# Patient Record
Sex: Male | Born: 1935
Health system: Southern US, Community
[De-identification: ages and names within clinical notes are randomized; demographics above are authoritative.]

## PROBLEM LIST (undated history)

## (undated) DIAGNOSIS — I10 Essential (primary) hypertension: Secondary | ICD-10-CM

## (undated) DIAGNOSIS — C61 Malignant neoplasm of prostate: Secondary | ICD-10-CM

## (undated) DIAGNOSIS — E7800 Pure hypercholesterolemia, unspecified: Secondary | ICD-10-CM

## (undated) DIAGNOSIS — I779 Disorder of arteries and arterioles, unspecified: Secondary | ICD-10-CM

## (undated) DIAGNOSIS — I2510 Atherosclerotic heart disease of native coronary artery without angina pectoris: Secondary | ICD-10-CM

## (undated) HISTORY — DX: Malignant neoplasm of prostate: C61

## (undated) HISTORY — DX: Atherosclerotic heart disease of native coronary artery without angina pectoris: I25.10

## (undated) HISTORY — DX: Pure hypercholesterolemia, unspecified: E78.00

## (undated) HISTORY — DX: Disorder of arteries and arterioles, unspecified: I77.9

## (undated) HISTORY — PX: CORNEAL TRANSPLANT: SHX108

## (undated) HISTORY — PX: OTHER SURGICAL HISTORY: SHX169

## (undated) HISTORY — DX: Essential (primary) hypertension: I10

---

## 2001-09-19 ENCOUNTER — Ambulatory Visit (HOSPITAL_COMMUNITY): Admission: RE | Admit: 2001-09-19 | Discharge: 2001-09-19 | Payer: Self-pay | Admitting: Interventional Cardiology

## 2001-09-26 ENCOUNTER — Encounter (HOSPITAL_COMMUNITY): Admission: RE | Admit: 2001-09-26 | Discharge: 2001-12-04 | Payer: Self-pay | Admitting: Interventional Cardiology

## 2002-04-10 ENCOUNTER — Encounter: Payer: Self-pay | Admitting: Surgery

## 2002-04-12 ENCOUNTER — Ambulatory Visit (HOSPITAL_COMMUNITY): Admission: RE | Admit: 2002-04-12 | Discharge: 2002-04-12 | Payer: Self-pay | Admitting: Surgery

## 2002-04-18 ENCOUNTER — Encounter: Admission: RE | Admit: 2002-04-18 | Discharge: 2002-04-18 | Payer: Self-pay | Admitting: Emergency Medicine

## 2002-04-18 ENCOUNTER — Encounter: Payer: Self-pay | Admitting: Emergency Medicine

## 2004-04-23 ENCOUNTER — Ambulatory Visit (HOSPITAL_COMMUNITY): Admission: RE | Admit: 2004-04-23 | Discharge: 2004-04-23 | Payer: Self-pay | Admitting: Urology

## 2004-04-23 ENCOUNTER — Ambulatory Visit (HOSPITAL_BASED_OUTPATIENT_CLINIC_OR_DEPARTMENT_OTHER): Admission: RE | Admit: 2004-04-23 | Discharge: 2004-04-23 | Payer: Self-pay | Admitting: Urology

## 2008-07-23 ENCOUNTER — Encounter: Admission: RE | Admit: 2008-07-23 | Discharge: 2008-07-23 | Payer: Self-pay | Admitting: Internal Medicine

## 2008-07-29 ENCOUNTER — Ambulatory Visit (HOSPITAL_COMMUNITY): Admission: RE | Admit: 2008-07-29 | Discharge: 2008-07-29 | Payer: Self-pay | Admitting: Thoracic Surgery

## 2008-07-31 ENCOUNTER — Ambulatory Visit: Payer: Self-pay | Admitting: Thoracic Surgery

## 2008-08-02 ENCOUNTER — Ambulatory Visit: Admission: RE | Admit: 2008-08-02 | Discharge: 2008-08-02 | Payer: Self-pay | Admitting: Thoracic Surgery

## 2008-08-02 ENCOUNTER — Encounter: Payer: Self-pay | Admitting: Emergency Medicine

## 2008-08-21 ENCOUNTER — Ambulatory Visit: Payer: Self-pay | Admitting: Thoracic Surgery

## 2008-10-22 ENCOUNTER — Ambulatory Visit: Payer: Self-pay | Admitting: Thoracic Surgery

## 2008-10-24 ENCOUNTER — Encounter: Payer: Self-pay | Admitting: Thoracic Surgery

## 2008-10-24 ENCOUNTER — Inpatient Hospital Stay (HOSPITAL_COMMUNITY): Admission: RE | Admit: 2008-10-24 | Discharge: 2008-10-29 | Payer: Self-pay | Admitting: Thoracic Surgery

## 2008-10-24 ENCOUNTER — Ambulatory Visit: Payer: Self-pay | Admitting: Thoracic Surgery

## 2008-10-24 HISTORY — PX: OTHER SURGICAL HISTORY: SHX169

## 2008-11-06 ENCOUNTER — Ambulatory Visit: Payer: Self-pay | Admitting: Internal Medicine

## 2008-11-06 ENCOUNTER — Encounter: Admission: RE | Admit: 2008-11-06 | Discharge: 2008-11-06 | Payer: Self-pay | Admitting: Thoracic Surgery

## 2008-11-06 ENCOUNTER — Encounter: Payer: Self-pay | Admitting: Emergency Medicine

## 2008-11-06 ENCOUNTER — Ambulatory Visit: Payer: Self-pay | Admitting: Thoracic Surgery

## 2008-11-08 DIAGNOSIS — E785 Hyperlipidemia, unspecified: Secondary | ICD-10-CM | POA: Insufficient documentation

## 2008-11-08 DIAGNOSIS — C3490 Malignant neoplasm of unspecified part of unspecified bronchus or lung: Secondary | ICD-10-CM | POA: Insufficient documentation

## 2008-11-08 DIAGNOSIS — I2510 Atherosclerotic heart disease of native coronary artery without angina pectoris: Secondary | ICD-10-CM | POA: Insufficient documentation

## 2008-11-08 DIAGNOSIS — I10 Essential (primary) hypertension: Secondary | ICD-10-CM | POA: Insufficient documentation

## 2008-11-12 ENCOUNTER — Ambulatory Visit: Payer: Self-pay | Admitting: Emergency Medicine

## 2008-11-12 DIAGNOSIS — R0902 Hypoxemia: Secondary | ICD-10-CM

## 2008-11-12 DIAGNOSIS — R05 Cough: Secondary | ICD-10-CM

## 2008-11-13 LAB — COMPREHENSIVE METABOLIC PANEL
BUN: 12 mg/dL (ref 6–23)
CO2: 26 mEq/L (ref 19–32)
Creatinine, Ser: 0.9 mg/dL (ref 0.40–1.50)
Glucose, Bld: 114 mg/dL — ABNORMAL HIGH (ref 70–99)
Total Bilirubin: 0.4 mg/dL (ref 0.3–1.2)

## 2008-11-13 LAB — CBC WITH DIFFERENTIAL/PLATELET
BASO%: 0.8 % (ref 0.0–2.0)
Basophils Absolute: 0.1 10*3/uL (ref 0.0–0.1)
Eosinophils Absolute: 0.5 10*3/uL (ref 0.0–0.5)
HCT: 36.6 % — ABNORMAL LOW (ref 38.4–49.9)
LYMPH%: 10.4 % — ABNORMAL LOW (ref 14.0–49.0)
MCHC: 32.8 g/dL (ref 32.0–36.0)
MONO#: 1.1 10*3/uL — ABNORMAL HIGH (ref 0.1–0.9)
NEUT%: 71 % (ref 39.0–75.0)
Platelets: 339 10*3/uL (ref 140–400)
WBC: 9 10*3/uL (ref 4.0–10.3)

## 2008-11-14 ENCOUNTER — Telehealth: Payer: Self-pay | Admitting: Emergency Medicine

## 2008-11-15 ENCOUNTER — Ambulatory Visit (HOSPITAL_COMMUNITY): Admission: RE | Admit: 2008-11-15 | Discharge: 2008-11-15 | Payer: Self-pay | Admitting: Emergency Medicine

## 2008-11-20 ENCOUNTER — Encounter: Admission: RE | Admit: 2008-11-20 | Discharge: 2008-11-20 | Payer: Self-pay | Admitting: Thoracic Surgery

## 2008-11-20 ENCOUNTER — Encounter: Payer: Self-pay | Admitting: Emergency Medicine

## 2008-11-20 ENCOUNTER — Ambulatory Visit: Payer: Self-pay | Admitting: Thoracic Surgery

## 2008-11-22 ENCOUNTER — Ambulatory Visit: Payer: Self-pay | Admitting: Emergency Medicine

## 2009-02-19 ENCOUNTER — Encounter: Admission: RE | Admit: 2009-02-19 | Discharge: 2009-02-19 | Payer: Self-pay | Admitting: Thoracic Surgery

## 2009-02-19 ENCOUNTER — Ambulatory Visit: Payer: Self-pay | Admitting: Thoracic Surgery

## 2009-04-22 ENCOUNTER — Ambulatory Visit: Payer: Self-pay | Admitting: Internal Medicine

## 2009-04-24 ENCOUNTER — Ambulatory Visit (HOSPITAL_COMMUNITY): Admission: RE | Admit: 2009-04-24 | Discharge: 2009-04-24 | Payer: Self-pay | Admitting: Internal Medicine

## 2009-04-24 LAB — COMPREHENSIVE METABOLIC PANEL
ALT: 12 U/L (ref 0–53)
CO2: 29 mEq/L (ref 19–32)
Creatinine, Ser: 0.98 mg/dL (ref 0.40–1.50)
Total Bilirubin: 0.4 mg/dL (ref 0.3–1.2)

## 2009-04-24 LAB — CBC WITH DIFFERENTIAL/PLATELET
BASO%: 0.6 % (ref 0.0–2.0)
EOS%: 2.2 % (ref 0.0–7.0)
HCT: 38.1 % — ABNORMAL LOW (ref 38.4–49.9)
LYMPH%: 10.7 % — ABNORMAL LOW (ref 14.0–49.0)
MCH: 29.4 pg (ref 27.2–33.4)
MCHC: 33.2 g/dL (ref 32.0–36.0)
MONO#: 1.1 10*3/uL — ABNORMAL HIGH (ref 0.1–0.9)
NEUT%: 72.6 % (ref 39.0–75.0)
Platelets: 292 10*3/uL (ref 140–400)

## 2009-05-07 ENCOUNTER — Ambulatory Visit: Payer: Self-pay | Admitting: Thoracic Surgery

## 2009-12-23 ENCOUNTER — Ambulatory Visit: Payer: Self-pay | Admitting: Internal Medicine

## 2009-12-25 LAB — COMPREHENSIVE METABOLIC PANEL
ALT: 12 U/L (ref 0–53)
AST: 19 U/L (ref 0–37)
Alkaline Phosphatase: 78 U/L (ref 39–117)
BUN: 12 mg/dL (ref 6–23)
Chloride: 105 mEq/L (ref 96–112)
Creatinine, Ser: 0.96 mg/dL (ref 0.40–1.50)
Potassium: 4.1 mEq/L (ref 3.5–5.3)

## 2009-12-25 LAB — CBC WITH DIFFERENTIAL/PLATELET
BASO%: 0.2 % (ref 0.0–2.0)
Basophils Absolute: 0 10*3/uL (ref 0.0–0.1)
EOS%: 2.2 % (ref 0.0–7.0)
HGB: 13.5 g/dL (ref 13.0–17.1)
MCH: 31.1 pg (ref 27.2–33.4)
MCHC: 34.2 g/dL (ref 32.0–36.0)
MCV: 90.9 fL (ref 79.3–98.0)
MONO%: 12 % (ref 0.0–14.0)
NEUT%: 77 % — ABNORMAL HIGH (ref 39.0–75.0)
RDW: 14.9 % — ABNORMAL HIGH (ref 11.0–14.6)
lymph#: 0.7 10*3/uL — ABNORMAL LOW (ref 0.9–3.3)

## 2009-12-26 ENCOUNTER — Ambulatory Visit (HOSPITAL_COMMUNITY): Admission: RE | Admit: 2009-12-26 | Discharge: 2009-12-26 | Payer: Self-pay | Admitting: Internal Medicine

## 2009-12-31 ENCOUNTER — Ambulatory Visit: Payer: Self-pay | Admitting: Thoracic Surgery

## 2010-05-29 ENCOUNTER — Other Ambulatory Visit: Payer: Self-pay | Admitting: Internal Medicine

## 2010-05-29 DIAGNOSIS — C3490 Malignant neoplasm of unspecified part of unspecified bronchus or lung: Secondary | ICD-10-CM

## 2010-07-09 ENCOUNTER — Other Ambulatory Visit: Payer: Self-pay | Admitting: Internal Medicine

## 2010-07-09 ENCOUNTER — Encounter (HOSPITAL_BASED_OUTPATIENT_CLINIC_OR_DEPARTMENT_OTHER): Payer: Medicare Other | Admitting: Internal Medicine

## 2010-07-09 DIAGNOSIS — C3410 Malignant neoplasm of upper lobe, unspecified bronchus or lung: Secondary | ICD-10-CM

## 2010-07-09 DIAGNOSIS — C3490 Malignant neoplasm of unspecified part of unspecified bronchus or lung: Secondary | ICD-10-CM

## 2010-07-09 LAB — CBC WITH DIFFERENTIAL/PLATELET
BASO%: 0.3 % (ref 0.0–2.0)
Basophils Absolute: 0 10*3/uL (ref 0.0–0.1)
EOS%: 1.7 % (ref 0.0–7.0)
HCT: 39.5 % (ref 38.4–49.9)
HGB: 13.1 g/dL (ref 13.0–17.1)
LYMPH%: 9.3 % — ABNORMAL LOW (ref 14.0–49.0)
MCH: 29.1 pg (ref 27.2–33.4)
MCHC: 33 g/dL (ref 32.0–36.0)
MCV: 87.9 fL (ref 79.3–98.0)
MONO%: 11.5 % (ref 0.0–14.0)
NEUT%: 77.2 % — ABNORMAL HIGH (ref 39.0–75.0)

## 2010-07-09 LAB — COMPREHENSIVE METABOLIC PANEL
AST: 14 U/L (ref 0–37)
Alkaline Phosphatase: 85 U/L (ref 39–117)
BUN: 12 mg/dL (ref 6–23)
Calcium: 8.2 mg/dL — ABNORMAL LOW (ref 8.4–10.5)
Chloride: 105 mEq/L (ref 96–112)
Creatinine, Ser: 1.03 mg/dL (ref 0.40–1.50)
Total Bilirubin: 0.5 mg/dL (ref 0.3–1.2)

## 2010-07-10 ENCOUNTER — Ambulatory Visit (HOSPITAL_COMMUNITY)
Admission: RE | Admit: 2010-07-10 | Discharge: 2010-07-10 | Disposition: A | Discharge status: Home / Self Care | Payer: Medicare Other | Source: Ambulatory Visit | Attending: Internal Medicine | Admitting: Internal Medicine

## 2010-07-10 DIAGNOSIS — C3490 Malignant neoplasm of unspecified part of unspecified bronchus or lung: Secondary | ICD-10-CM | POA: Insufficient documentation

## 2010-07-10 DIAGNOSIS — Z902 Acquired absence of lung [part of]: Secondary | ICD-10-CM | POA: Insufficient documentation

## 2010-07-10 MED ORDER — IOHEXOL 300 MG/ML  SOLN
80.0000 mL | Freq: Once | INTRAMUSCULAR | Status: AC | PRN
  Administered 2010-07-10: 80 mL via INTRAVENOUS

## 2010-07-13 ENCOUNTER — Encounter (HOSPITAL_BASED_OUTPATIENT_CLINIC_OR_DEPARTMENT_OTHER): Payer: Medicare Other | Admitting: Internal Medicine

## 2010-07-13 ENCOUNTER — Other Ambulatory Visit: Payer: Self-pay | Admitting: Internal Medicine

## 2010-07-13 DIAGNOSIS — C3410 Malignant neoplasm of upper lobe, unspecified bronchus or lung: Secondary | ICD-10-CM

## 2010-07-13 DIAGNOSIS — C3490 Malignant neoplasm of unspecified part of unspecified bronchus or lung: Secondary | ICD-10-CM

## 2010-07-13 DIAGNOSIS — J984 Other disorders of lung: Secondary | ICD-10-CM

## 2010-07-14 ENCOUNTER — Ambulatory Visit (INDEPENDENT_AMBULATORY_CARE_PROVIDER_SITE_OTHER): Payer: Medicare Other | Admitting: Thoracic Surgery

## 2010-07-14 DIAGNOSIS — C3410 Malignant neoplasm of upper lobe, unspecified bronchus or lung: Secondary | ICD-10-CM

## 2010-08-17 LAB — BASIC METABOLIC PANEL
BUN: 10 mg/dL (ref 6–23)
BUN: 7 mg/dL (ref 6–23)
BUN: 7 mg/dL (ref 6–23)
CO2: 24 mEq/L (ref 19–32)
CO2: 24 mEq/L (ref 19–32)
Calcium: 8.4 mg/dL (ref 8.4–10.5)
Chloride: 101 mEq/L (ref 96–112)
Chloride: 103 mEq/L (ref 96–112)
Creatinine, Ser: 0.86 mg/dL (ref 0.4–1.5)
GFR calc non Af Amer: >60 mL/min (ref >60)
Glucose, Bld: 112 mg/dL — ABNORMAL HIGH (ref 70–99)
Potassium: 3.7 mEq/L (ref 3.5–5.1)
Potassium: 4 mEq/L (ref 3.5–5.1)

## 2010-08-17 LAB — BLOOD GAS, ARTERIAL
Acid-base deficit: 0.7 mmol/L (ref 0.0–2.0)
TCO2: 24.2 mmol/L (ref 0–100)
pCO2 arterial: 36.2 mmHg (ref 35.0–45.0)

## 2010-08-17 LAB — COMPREHENSIVE METABOLIC PANEL
ALT: 11 U/L (ref 0–53)
AST: 20 U/L (ref 0–37)
Albumin: 2.6 g/dL — ABNORMAL LOW (ref 3.5–5.2)
Albumin: 3.2 g/dL — ABNORMAL LOW (ref 3.5–5.2)
Alkaline Phosphatase: 61 U/L (ref 39–117)
BUN: 12 mg/dL (ref 6–23)
CO2: 22 mEq/L (ref 19–32)
Calcium: 8.2 mg/dL — ABNORMAL LOW (ref 8.4–10.5)
Chloride: 106 mEq/L (ref 96–112)
Creatinine, Ser: 0.85 mg/dL (ref 0.4–1.5)
GFR calc Af Amer: >60 mL/min (ref >60)
GFR calc non Af Amer: >60 mL/min (ref >60)
Glucose, Bld: 94 mg/dL (ref 70–99)
Potassium: 4.2 mEq/L (ref 3.5–5.1)
Sodium: 135 mEq/L (ref 135–145)
Total Bilirubin: 0.5 mg/dL (ref 0.3–1.2)
Total Protein: 5.8 g/dL — ABNORMAL LOW (ref 6.0–8.3)

## 2010-08-17 LAB — CBC
HCT: 35.9 % — ABNORMAL LOW (ref 39.0–52.0)
HCT: 37.9 % — ABNORMAL LOW (ref 39.0–52.0)
HCT: 39.3 % (ref 39.0–52.0)
MCHC: 33.2 g/dL (ref 30.0–36.0)
MCHC: 33.3 g/dL (ref 30.0–36.0)
MCV: 92.1 fL (ref 78.0–100.0)
MCV: 92.3 fL (ref 78.0–100.0)
MCV: 92.4 fL (ref 78.0–100.0)
Platelets: 256 10*3/uL (ref 150–400)
Platelets: 271 10*3/uL (ref 150–400)
Platelets: 273 10*3/uL (ref 150–400)
Platelets: 280 10*3/uL (ref 150–400)
Platelets: 272 10*3/uL (ref 150–400)
RBC: 4.11 MIL/uL — ABNORMAL LOW (ref 4.22–5.81)
RDW: 15.1 % (ref 11.5–15.5)
RDW: 15.1 % (ref 11.5–15.5)
WBC: 10.5 10*3/uL (ref 4.0–10.5)
WBC: 13.5 10*3/uL — ABNORMAL HIGH (ref 4.0–10.5)
WBC: 7.5 10*3/uL (ref 4.0–10.5)

## 2010-08-17 LAB — GLUCOSE, CAPILLARY
Glucose-Capillary: 100 mg/dL — ABNORMAL HIGH (ref 70–99)
Glucose-Capillary: 113 mg/dL — ABNORMAL HIGH (ref 70–99)
Glucose-Capillary: 119 mg/dL — ABNORMAL HIGH (ref 70–99)
Glucose-Capillary: 120 mg/dL — ABNORMAL HIGH (ref 70–99)
Glucose-Capillary: 121 mg/dL — ABNORMAL HIGH (ref 70–99)
Glucose-Capillary: 123 mg/dL — ABNORMAL HIGH (ref 70–99)
Glucose-Capillary: 128 mg/dL — ABNORMAL HIGH (ref 70–99)
Glucose-Capillary: 133 mg/dL — ABNORMAL HIGH (ref 70–99)
Glucose-Capillary: 143 mg/dL — ABNORMAL HIGH (ref 70–99)
Glucose-Capillary: 79 mg/dL (ref 70–99)
Glucose-Capillary: 82 mg/dL (ref 70–99)

## 2010-08-17 LAB — TYPE AND SCREEN: ABO/RH(D): B POS

## 2010-08-17 LAB — POCT I-STAT 3, ART BLOOD GAS (G3+)
Acid-base deficit: 1 mmol/L (ref 0.0–2.0)
Bicarbonate: 23.7 mEq/L (ref 20.0–24.0)
TCO2: 25 mmol/L (ref 0–100)
pO2, Arterial: 130 mmHg — ABNORMAL HIGH (ref 80.0–100.0)

## 2010-08-17 LAB — URINALYSIS, ROUTINE W REFLEX MICROSCOPIC
Bilirubin Urine: NEGATIVE
Ketones, ur: NEGATIVE mg/dL
Nitrite: NEGATIVE
Urobilinogen, UA: 1 mg/dL (ref 0.0–1.0)
pH: 7 (ref 5.0–8.0)

## 2010-08-17 LAB — PROTIME-INR
INR: 1 (ref 0.00–1.49)
Prothrombin Time: 13.4 seconds (ref 11.6–15.2)

## 2010-08-17 LAB — APTT: aPTT: 36 seconds (ref 24–37)

## 2010-08-17 NOTE — Letter (Signed)
July 14, 2010  Mohamed K. [name], MD [address] [place], [place] [address]  Re:  KOKI, BUXTON                  DOB:  [birth year]  Dear [name],  The patient returns for his 34-month CT scan, and we saw nothing on this. The CT scan showed no evidence of recurrence of cancer, still with a nodular process in the right upper lobe which was thought to be inflammatory and had not changed.  There also was one crystal cystic structure in his pancreas, but apparently this has been seen before in the uncinate process, but apparently this has been seen before by Dr. Vonita Moss.  He will return to [place], [place] in August, and I will see him back again in 6 months with another CT scan.  Ines Bloomer, M.D. Electronically Signed  DPB/MEDQ  D:  07/14/2010  T:  07/15/2010  Job:  [id number]  cc:   Thora Lance, M.D. Maretta Bees. Vonita Moss, M.D.

## 2010-09-22 NOTE — H&P (Signed)
Richard [name], Richard NO.:  [id number]   MEDICAL RECORD NO.:  [id number]          PATIENT TYPE:  INP   LOCATION:  NA                           FACILITY:  [facility]   PHYSICIAN:  Ines Bloomer, M.D. DATE OF BIRTH:  03/30/1935   DATE OF ADMISSION:  DATE OF DISCHARGE:                              HISTORY & PHYSICAL   CHIEF COMPLAINT:  Lung mass.   HISTORY OF PRESENT ILLNESS:  This 76 year old Caucasian male who was  found to have some right shoulder pain, and a chest x-ray revealed a  left upper lobe lesion.  He has lost 10 pounds.  A  CT scan showed a  left upper lobe lesion that was 2.4 x 2.5 size that was worrisome for  bronchoalveolar cancer.  He quit smoking many years ago.  A PET scan was  done that showed minimal uptake, but it was still worrisome for  bronchoalveolar cancer.  He saw Dr. Verdis Prime for clearance and sees  Dr. Vonita Moss for his prostate.  He has had no hemoptysis, fever, chills,  or excessive sputum.  He is essentially a nonsmoker because he quit in  1964.   MEDICATIONS:  1. Plavix 75 mg a day.  2. Vytorin 10-80 one a day.  3. Vitamins.   ALLERGY:  ASPIRIN causes him to have hives.   FAMILY HISTORY:  Noncontributory.   SOCIAL HISTORY:  He is married.  He has 3 children.  He is retired.  He  quit smoking in 1964.  Occasional alcohol intake.   REVIEW OF SYSTEMS:  He is 162 pounds.  He is 5 feet 1 inch.  His weight  has been stable.  CARDIAC:  See history of present illness.  PULMONARY:  See history of present illness.  GI:  No nausea, vomiting, constipation,  or diarrhea.  GU:  No kidney disease, dysuria, or frequent urination.  VASCULAR:  No claudication, DVT, or TIAs.  NEUROLOGICAL:  No dizziness,  headaches, blackouts, or seizures.  MUSCULOSKELETAL:  No arthritis or  joint pain.  PSYCHIATRIC:  No depression or nervous.  HEENT:  No changes  in eyesight or hearing.  HEMATOLOGICAL:  No problems with bleeding,  clotting disorders, or  anemia.   PHYSICAL EXAMINATION:  GENERAL:  He is a well-developed, Caucasian male  in no acute distress.  VITAL SIGNS:  His blood pressure is 173/88, pulse 77, respirations 18,  and saturating 98%.  HEENT:  Head is atraumatic.  Eyes; pupils equal and reactive to light  and accommodation.  Extraocular movements are normal.  Ears; tympanic  membranes are intact.  Nose; no septal deviation.  Throat; without  lesion.  NECK:  Supple without thyromegaly.  No supraclavicular or axillary  adenopathy.  CHEST:  Clear to auscultation and percussion.  HEART:  Regular sinus rhythm.  ABDOMEN:  Soft.  There is no hepatosplenomegaly.  EXTREMITIES:  Pulses 2+.  There is no clubbing or edema.  NEUROLOGICAL:  He is oriented x3.  Sensory and motor intact.  Cranial  nerves intact.   IMPRESSION:  1. Left upper lobe lesion.  2. History of coronary artery disease.  3. Hypercholesterolemia.   PLAN:  Left VATS, left upper lobectomy.      Ines Bloomer, M.D.  Electronically Signed     DPB/MEDQ  D:  10/22/2008  T:  10/23/2008  Job:  [id number]

## 2010-09-22 NOTE — Discharge Summary (Signed)
Richard [name], VERMEER NO.:  [id number]   MEDICAL RECORD NO.:  [id number]          PATIENT TYPE:  INP   LOCATION:  2022                         FACILITY:  [facility]   PHYSICIAN:  Ines Bloomer, M.D. DATE OF BIRTH:  1935/02/02   DATE OF ADMISSION:  10/24/2008  DATE OF DISCHARGE:                               DISCHARGE SUMMARY   FINAL DIAGNOSIS:  Left upper lobe mass.   SECONDARY DIAGNOSES:  1. Hypertension.  2. Hyperlipidemia.  3. History of coronary artery disease.   IN-HOSPITAL OPERATIONS AND PROCEDURES:  1. Left video-assisted thoracoscopic surgery/left mini thoracotomy,      left upper lobectomy with lymph node dissection.   HISTORY AND PHYSICAL AND HOSPITAL COURSE:  The patient is a 76 year old  Caucasian male who was found to have some right shoulder pain.  Chest x-  ray done revealed a left upper lobe lesion.  Followup CT scan was done  showed left upper lobe lesion measuring 6.4 x 2.5 in size by that was  worrisome for bronchoalveolar cancer.  The patient does admit to loss of  10 pounds recently.  He quit smoking many years ago.  PET scan was then  done showed minimal uptake that was still worrisome for bronchioalveolar  cancer.  The patient denies hemoptysis, fever, chills, or excessive  sputum.  He was seen by Dr. Edwyna Shell.  Dr. Edwyna Shell discussed with the  patient undergoing left upper lobectomy.  He discussed risks and  benefits with the patient.  The patient acknowledged understanding and  agreed to proceed.  Surgery was scheduled for October 24, 2008.  For  further details of the patient's past medical history and physical exam,  please see dictated H and P.   The patient was taken to the operating room on October 24, 2008 where he  underwent left video-assisted thoracoscopic surgery with mini  thoracotomy, left upper lobectomy with lymph node dissection.  The  patient tolerated this procedure well and was transferred to the  intensive care unit in  stable condition.  The patient's final pathology  report is currently pending.  The patient's postoperative course was  pretty much unremarkable.  He was able to be extubated following  surgery.  Post-extubation, the patient was noted to be alert and  oriented x4.  He was noted to be hemodynamically stable.  Daily chest x-  rays obtained and remained stable.  No air leak noted.  Posterior chest  tube was discontinued on postop day #2 with remaining chest tube  discontinued on postop day #3.  A followup chest x-ray shows no  pneumothorax and was able.  During this time, the patient was working on  his incentive spirometer.  He is able to be weaned off oxygen,  saturating greater than 96% on room air.  Vital signs were followed  closely.  He remained afebrile.  The remains in normal sinus rhythm.  His blood pressure was slightly elevated postoperatively and he was  started on ACE inhibitor.  Currently, is on monitoring.  Postoperatively, the patient was up ambulating well without difficulty.  He was  tolerating diet well and no nausea or vomiting noted.  All  incisions were clean, dry, and intact and healing well.   On October 28, 2008, the patient is progressing well.  Vital signs are  stable.  He is saturating 96% on room air.  His most recent lab work  shows sodium of 136, potassium 3.7, chloride of 101, bicarbonate 24, BUN  is 7, creatinine is 0.71, glucose of 136.  White blood cell count 10.5,  hemoglobin of 11.9, hematocrit 35.8, platelet count 266.  The patient is  ready for discharge pending if he remained stable.  I will check a PA  and lateral chest x-ray prior to discharge home.   FOLLOWUP APPOINTMENTS:  Followup appointment has been arranged with Dr.  Edwyna Shell for November 06, 2008 at 10:45 a.m.  The patient will need to obtain  PA and lateral chest x-rays 45 minutes prior to this appointment.   ACTIVITY:  The patient instructed no driving until released to do so.  No heavy lifting  over 10 pounds.  He is told to ambulate 3-4 times per  day, progress as tolerated, and continue his breathing exercises.   DIET:  The patient is educated on diet to be low fat and low salt.   INCISIONAL CARE:  The patient was told to shower, washing his incisions  using soap and water.  He is to contact the office if he develops any  drainage or opening from any of his incision sites.   DISCHARGE MEDICATIONS:  1. Plavix 75 mg daily.  2. Vytorin 80/10 mg daily.  3. Metoprolol 75 mg b.i.d.  4. FML one drop both eyes daily.  5. Combigan one drop left eye b.i.d.  6. Timolol one drop left eye 3 times per day.  7. Percocet 5/325 one to two tablets q.4-6 h. p.r.n. pain.  8. Lisinopril 5 mg daily.      Sol Blazing, PA      Ines Bloomer, M.D.  Electronically Signed    KMD/MEDQ  D:  10/28/2008  T:  10/29/2008  Job:  [id number]

## 2010-09-22 NOTE — Letter (Signed)
July 31, 2008   Thora Lance, MD  [address]  [place], [place] [address]   Re:  Richard [name], ALBRO                  DOB:  April 11, 1935   Dear Richard [name]:   I saw the patient in the office today.  He apparently was having some  right shoulder pain and x-rays revealed a left upper lobe lesion.  He  has also lost 10 pounds.  A CT scan showed a left upper lobe lesion and  a PET scan was done which showed minimal uptake in the left upper lobe  lesion.  The CT scan noted very worrisome for bronchoalveolar cancer 2.4  x 2.5 cm in size.  He quit smoking many years ago.  He has had no  hemoptysis, fever, chills, or excessive sputum.  He is apparently  a  former patient of Dr. Lorenz Coaster.  He was seen by Dr. Katrinka Blazing 5 years ago and  had a cardiac cath several years ago, which showed one area of  occlusion, but his recent Cardiolite 1 year ago was apparently stable.  His medications include metoprolol 25 mg twice a day, Plavix 75 mg,  Vytorin 10-80 one a day, and vitamins  Aspirin causes him to have hives.  His family history is noncontributory.   SOCIAL HISTORY:  He is married.  He has 3 children.  He is retired.  Quit smoking in 1964.  Occasional alcohol intake.   REVIEW OF SYSTEMS:  He is 160 pounds.  He is 5 feet 1.  In general, his  weight has been stable.  He has had some mild weight loss.  Cardiac:  See history of present illness.  Pulmonary:  See history of present  illness.  GI:  No nausea, vomiting, constipation, or diarrhea.  GU:  No  kidney disease, dysuria, or frequent urination.  Vascular:  No  claudication, DVT, or TIAs.  Neurological:  No dizziness, headaches,  blackouts, or seizures.  Musculoskeletal:  No arthritis or joint pain.  Psychiatric:  No depression or nervousness.  Eyes/ENT:  No change in her  eyesight or hearing.  Hematological:  No problems with bleeding,  clotting disorders, or anemia.   PHYSICAL EXAMINATION:  GENERAL:  He is a well-developed Caucasian male  in  no acute distress.  VITAL SIGNS:  His blood pressure is 138/73, pulse 60, respirations 18,  and saturations were 96%.  HEAD, EYES, EARS, NOSE, AND THROAT:  Unremarkable.  NECK:  Supple without thyromegaly.  There is no supraclavicular or  axillary adenopathy.  CHEST:  Clear to auscultation and percussion.  HEART:  Regular sinus rhythm.  No murmurs.  ABDOMEN:  Soft.  There is no hepatosplenomegaly.  EXTREMITIES:  Pulses are 2+.  There is no clubbing or edema.  NEUROLOGICAL:  He is oriented x3.  Sensory and motor intact.  Cranial  nerves intact.   The patient is well-developed 76 year old male, and would be a probably  good operative candidate.  I talked to him and I will plan to get  pulmonary function test on him and have him see Dr. Katrinka Blazing for cardiac  clearance, but I do think this is probably bronchoalveolar cancer given  the low uptake on the PET scan and its morphology, and he is essentially  a nonsmoker, having quit in 1964.  I would recommend that he probably  needs a left upper lobectomy.  I did give him the option of  a needle  biopsy if they so request, but that was not my recommendation.  I will  see him back again after his cardiac clearance and his pulmonary  function tests.   Sincerely,   Ines Bloomer, M.D.  Electronically Signed   DPB/MEDQ  D:  07/31/2008  T:  08/01/2008  Job:  [id number]

## 2010-09-22 NOTE — Letter (Signed)
May 07, 2009   Mohamed K. [name], MD  [address]  [place], [place] [address]   Re:  MONTGOMERY, FAVOR                  DOB:  15-Jul-1934   Dear Dr. [name],   I saw the patient back in the office today.  His CT scan was stable with  no evidence of any recurrence of his cancer.  We will see him back again  in 7-8 months when he returns from [place].  He is doing well overall.  His blood pressure is 168/83, pulse 52, respirations 18, sats were 96%.  I appreciate the opportunity of seeing the patient.   Sincerely,   Ines Bloomer, M.D.  Electronically Signed   DPB/MEDQ  D:  05/07/2009  T:  05/08/2009  Job:  [id number]

## 2010-09-22 NOTE — Letter (Signed)
December 31, 2009   Lajuana Matte, MD  [phone]  [place], [place] [address]   Re:  Richard [name], LIEBMAN                  DOB:  Sep 23, 1934   Dear [name]:   I saw the patient at the office and reviewed his recent CT scan which  really showed no recurrence.  There was some slight reaction in the left  lower lobe that appeared to be probable inflammatory.  I think he has  had some problems like this before, but obviously we will need to keep  an eye on this.  He will be back here in February and we will see him  again at that time.  His blood pressure was 149/73, pulse 66,  respirations 18, sats were 98%.  I appreciate the opportunity of seeing  the patient.   Sincerely,   Ines Bloomer, M.D.  Electronically Signed   DPB/MEDQ  D:  12/31/2009  T:  01/01/2010  Job:  [id number]

## 2010-09-22 NOTE — Letter (Signed)
November 20, 2008   Thora Lance, MD  [address]  [place], [place] [address]   Re:  Richard [name], KLAUSING                  DOB:  November 24, 1934   Dear [name];   I saw the patient in the office today.  His chest x-ray looks good.  His  incisions are well healed.  I told him to gradually increase his  activities.  He will be leaving for [place] on August 1.  He had a V/Q  scan done by Dr. Delton Coombes, which was negative.  His sats today as I  mentioned was 96%.  From my standpoint, he is doing well.  I will  discuss with Dr. Delton Coombes whether we should give him oxygen to go back to  [place], but I do not think that will need to be done as he has ever  had a problem with altitude sickness before, and I do not think his  hypoxia will be that more as he gets further out from his surgery.  I  will see him back again in October when he returns from [place], but he  noticed to contact me before, if he has any problems.  Dr. Shirline Frees saw  him and did not recommend any further chemotherapy at the present time  but for observation   DICTATION ENDED AT THIS POINT.   Ines Bloomer, M.D.  Electronically Signed   DPB/MEDQ  D:  11/20/2008  T:  11/21/2008  Job:  [id number]   cc:   Lajuana Matte, MD  Leslye Peer, MD

## 2010-09-22 NOTE — Letter (Signed)
February 19, 2009   Mohamed K. [name], MD  [address]  [place], [place] [address]   Re:  Richard [name], UNO                  DOB:  12-25-1934   Dear [name]:   I saw the patient in the office today.  His chest x-ray looks.  He is  doing markedly well.  He was out in [place] all summer with minimal  problems as for his breathing.  His blood pressure is 125/68, pulse 68,  respirations 18, sats were 97%.  Plan to see him back again in December  when he gets his first CT scan.   Ines Bloomer, M.D.  Electronically Signed   DPB/MEDQ  D:  02/19/2009  T:  02/20/2009  Job:  [id number]   cc:   Thora Lance, M.D.

## 2010-09-22 NOTE — Letter (Signed)
October 22, 2008   Thora Lance, MD  [address]  [place], [place] [address]   Re:  CULLEN, LAHAIE                  DOB:  05/18/1934   Dear Dr. Valentina Lucks:   I appreciate the opportunity of seeing the patient.  He returns from  [place] and is doing well.  We will have him schedule for surgery for his  left upper lobe lesion on Thursday, October 24, 2008.  We explained in  great detail the procedure and we will plan to do it at that time.  His  blood pressure was 173/88, pulse 57, respirations 18, and sats 98%.  He  stopped his Plavix on October 18, 2008.  I will let you know our operative  findings.   Sincerely,   Ines Bloomer, M.D.  Electronically Signed   DPB/MEDQ  D:  10/22/2008  T:  10/23/2008  Job:  [id number]   cc:   Maretta Bees. Vonita Moss, M.D.  Lyn Records, M.D.

## 2010-09-22 NOTE — Letter (Signed)
August 21, 2008   Thora Lance, MD  [address]  [place], [place] [address]   Re:  ALMANDO, BRAWLEY                  DOB:  [birth year]   Dear [name],   I saw the patient in the office today.  His pulmonary function tests are  outstanding with an FVC of 3.89 and FEV-1 of 3.01, and diffusion  capacity of 100%.  He will be an excellent candidate for left upper  lobectomy and we will plan to do this when he returns from [place] on  October 18, 2008.  The surgery will be scheduled on October 24, 2008.  His  blood pressure is 148/82, pulse 56, respirations 18, and saturations  were 97%.  I will see him back again on October 22, 2008, prior to his  surgery.   Ines Bloomer, M.D.  Electronically Signed   DPB/MEDQ  D:  08/21/2008  T:  08/22/2008  Job:  [id number]   cc:   Lyn Records, M.D.

## 2010-09-22 NOTE — Op Note (Signed)
NAMEZYAD, BOOMER NO.:  [id number]   MEDICAL RECORD NO.:  [id number]          PATIENT TYPE:  INP   LOCATION:  2313                         FACILITY:  [facility]   PHYSICIAN:  Ines Bloomer, M.D. DATE OF BIRTH:  1934-09-06   DATE OF PROCEDURE:  DATE OF DISCHARGE:                               OPERATIVE REPORT   PREOPERATIVE DIAGNOSIS:  Left upper lobe mass.   POSTOPERATIVE DIAGNOSIS:  Adenocarcinoma, left upper lobe.   OPERATION PERFORMED:  Left video-assisted thoracic surgery, mini-  thoracotomy, and left upper lobectomy.   SURGEON:  Ines Bloomer, MD   ANESTHESIA:  General.   After percutaneous insertion of all monitoring lines, the patient  underwent general anesthesia.  The left lung was deflated.  A dual-lumen  tube was inserted.  The patient was prepped and draped in the usual  sterile manner.  This patient was found to have a left upper lobe lesion  which on PET uptake was slightly in the 2 to 2-3 range.  There was no  evidence of spread to any of the lymph nodes.  He was brought to the  operating room for resection.  He was prepped and draped in the usual  sterile manner and as mentioned turned to the left lateral thoracotomy  position.  A trocar site was made in the anterior axillary line and  seventh intercostal space and a 0-degree scope was inserted and the  patient's lung looked good.  The patient was a nonsmoker.  However, we  then made a 7-8 cm incision over the triangle of auscultation and  partially divided  the latissimus and reflected the serratus anteriorly  and entered the fifth intercostal space and through that we palpated a  mass in the middle of the left upper lobe.  TPA was placed in the space  and dissection was started inferiorly, dissecting out the inferior  pulmonary ligament and dividing with electrocautery dissecting out an 8L  node.  Then, we dissected the posterior mediastinum dissecting out an  11L and more 10L nodes.   Attention was turned to the anterior  mediastinum and dissecting out some 4L and some 10L nodes.  The superior  pulmonary vein was dissected out looped with a vascular tape and divided  with the autosuture 30-mm stapler.  We then dissected out the pulmonary  artery, dissecting out a #5 node.  Dissection was then turned to the  fissure and the fissure was divided with electrocautery and several 10L  and 11L nodes were dissected free off the artery.  The anterior branch  to the upper lobe was stapled and divided with the autosuture 2-mm  stapler, then the lingular branch was divided with the autosuture 2-mm  stapler that left a large apical posterior branch which was likewise  divided with the autosuture 2-mm stapler.  Several more 10L nodes were  dissected free from around the bronchus and the bronchus was stapled  with an autosuture 4.5 stapler that was 45 mm long.  The left upper lobe  was sent for frozen section which revealed adenocarcinoma in the  lesion.  CoSeal was applied to the staple line and 2 chest tubes were brought in,  1 through the trocar sites and 1 through a separate stab wound, these  were 28 chest tubes and sutured in place with 0 silk.  Marcaine block  was done in the usual fashion.  Three holes were drilled, one  through the pericostal and then a single On-Q was inserted in the usual  fashion.  The pericostals were tied down.  Chest was closed with #1  Vicryl in the muscle layer, 2-0 Vicryl in the subcutaneous tissue, and 3-  0 Vicryl subcuticular stitch.  The patient was returned to the recovery  room in stable condition.      Ines Bloomer, M.D.  Electronically Signed     DPB/MEDQ  D:  10/24/2008  T:  10/25/2008  Job:  [id number]

## 2010-09-22 NOTE — Letter (Signed)
November 06, 2008   Thora Lance, MD  [address]  [place], [place] [address]   Re:  OMAREE, FUQUA                  DOB:  02-Mar-1935   Dear [name],   I saw the patient back in the office today after we did a left upper  lobectomy for a non-small cell lung cancer.  He was a stage IA cancer  with negative nodes.  It was an adenocarcinoma with some bronchoalveolar  features.  The other thing he had was interesting that he had some  granulomatous reaction that I thought might have been with some  bronchiolitis obliterans-organizing pneumonia which we thought might be  an allergic reaction.  I am referring him to Dr. Levy Pupa for his  evaluation on this, but otherwise he is unremarkably well after his  surgery.  We did add lisinopril 10 mg a day for his hypertension as well  as increased his Lopressor to 50 mg twice a day.  His blood pressure was  153/86, pulse 72, respirations 18, and saturations were 95%.  His only  complaint was a dry cough which I think is probably from the surgery and  not from the lisinopril, but we will watch today closely.  I will see  him back again in 2 weeks because he want to go to [place] at the end  of July 2010.   Ines Bloomer, M.D.  Electronically Signed   DPB/MEDQ  D:  11/06/2008  T:  11/06/2008  Job:  [id number]   cc:   Lajuana Matte, MD  Leslye Peer, MD

## 2010-09-25 NOTE — Op Note (Signed)
Richard [name], TUMBLESON NO.:  [id number]   MEDICAL RECORD NO.:  [id number]          PATIENT TYPE:  AMB   LOCATION:  [facility]                         FACILITY:  [facility]   PHYSICIAN:  Maretta Bees. Vonita Moss, M.D.DATE OF BIRTH:  [birth year]   DATE OF PROCEDURE:  04/23/2004  DATE OF DISCHARGE:                                 OPERATIVE REPORT   PREOPERATIVE DIAGNOSES:  Microhematuria, positive NMP-22, history of  radioactive seed implantation to the prostate.   POSTOPERATIVE DIAGNOSES:  Microhematuria, positive NMP-22, history of  radioactive seed implantation to the prostate.   INDICATIONS FOR PROCEDURE:  This 76 year old white male had seed  implantation in 1997 for prostatic carcinoma.  His PSA's have been very low.  Recent urinalysis showed microhematuria and NMP-22 is positive and he is  brought to the OR today for further workup and evaluation.  He has been on  Plavix but he is leaving town for two months and felt it was necessary to go  ahead and evaluate him in spite of that and he has been cleared by Dr. Garnette Scheuermann, his cardiologist.   DESCRIPTION OF PROCEDURE:  The patient was brought to the operating room,  placed in lithotomy position, external genitalia were prepped and draped in  the usual fashion.  He was cystoscoped, anterior urethra was normal. The  prostatic urethra had some increased vascularity but no active bleeding. The  bladder itself was totally clean and clear of any stones, tumors or  inflammatory lesions. Bilateral retrograde pyelograms were obtained with  delicate ureters with no filling defects and no obstruction but it looked  like he had a mass effect in the left kidney.  The bladder was emptied, the  scope removed and the patient sent to the recovery room in good condition.  He will come upstairs to my office after he recovers for a renal ultrasound.      LJP/MEDQ  D:  04/23/2004  T:  04/23/2004  Job:  [id number]   cc:   Lesleigh Noe,  M.D.  [address]  [address]  [place]  [place] [address]  Fax: [phone]

## 2010-09-25 NOTE — Op Note (Signed)
NAME:  Richard [name], Richard [name]                            ACCOUNT NO.:  [id number]   MEDICAL RECORD NO.:  [id number]                   PATIENT TYPE:  AMB   LOCATION:  DAY                                  FACILITY:  [facility]   PHYSICIAN:  Sandria Bales. Ezzard Standing, M.D.               DATE OF BIRTH:  03/08/1935   DATE OF PROCEDURE:  04/12/2002  DATE OF DISCHARGE:                                 OPERATIVE REPORT   CCS#:  [id number]   PREOPERATIVE DIAGNOSES:  Bilateral inguinal hernias.   POSTOPERATIVE DIAGNOSES:  Small left direct inguinal hernia, medium right  direct inguinal hernia and small indirect right inguinal hernia.   PROCEDURE:  Laparoscopic bilateral inguinal hernia repair with precut mesh.   SURGEON:  Sandria Bales. Ezzard Standing, M.D.   FIRST ASSISTANT:  None.   ANESTHESIA:  General endotracheal.   ESTIMATED BLOOD LOSS:  Minimal.   INDICATIONS FOR PROCEDURE:  Mr. Ronan is a 76 year old white male patient of  Dr. Leslee Home followed by Dr. Garnette Scheuermann from a cardiology standpoint. He  has a moderate size right inguinal hernia and an earlier small left inguinal  hernia. I discussed with him about repairing these hernias laparoscopically.  I discussed the indications, potential complications including but not  limited to bleeding, infection, nerve injury, recurrent hernia.   He has agreed to go ahead with laparoscopic hernia repair. The patient  underwent a general endotracheal anesthetic. He had a gram of Ancef at the  initiation of this procedure, had a Foley catheter in place. His abdomen was  shaved, prepped with Betadine solution, sterilely draped.   DESCRIPTION OF PROCEDURE:  An infraumbilical incision was made with sharp  dissection and carried down to the rectus abdominis fascia. I went to the  right side through the anterior fascia on the right because his large hernia  was on the right, retracted the rectus abdominis muscle anteriorly and  placed the preperitoneal balloon in the preperitoneal  space and insufflated  it under direct laparoscopic visualization. I had good insufflation of the  balloon with the muscle being dissected anteriorly, the peritoneum dissected  posteriorly.   I placed two additional trocars which were 5 mm trocars along the level of  the anterior iliac spine on the left lower quadrant and right lower quadrant  and dissected out the inguinal floor, identified the pubic tubercle,  Cooper's ligament. I encircled the cord structures, I identified the  inferior epigastric vessels on both sides. On the right side, he had a  moderate to large indirect inguinal hernia which was lateral. He also had a  small indirect inguinal hernia component which I reduced the peritoneal  cavity on the right. On the left side, he had a small direct inguinal hernia  laterally sort of in the same location on the right side but much smaller.  There was no indirect component on the left side.  I then did a precut atrium mesh and placed this laparoscopically first on  the left side placing staples in the pubic bone medially, Coopers ligament  inferiorly, transversalis fascia superiorly. I placed the mesh around the  cord strictures, I used 13 staples on the left side. I placed the same mesh  on the right side, again the precut atrium mesh packing it medially to the  pubic tubercle, inferiorly to Cooper's ligament, superiorly transversalis  fascia placing the mesh around the cord structures and used 17 staples on  the right side. Both mesh lay flat, covered the hernia defects well. I  thought I had good purchase with the staples and there was on bleeding. The  trocars were removed under direct visualization. I did get some  intraperitoneal air during the case but this really did not affect, I don't  think, the dissection at all. I then removed the umbilical trocar, closed  the anterior fascia with an interrupted #0 Vicryl suture, closed the skin  with 5-0 Vicryl suture, painted it  with tinctured Benzoin and steri-stripped  it.   The patient tolerated the procedure well and was transported to the recovery  room in good condition. Sponge and needle count were correct at the end of  the case.                                                Sandria Bales. Ezzard Standing, M.D.    DHN/MEDQ  D:  04/12/2002  T:  04/12/2002  Job:  [id number]   cc:   Reuben Likes, M.D.  [address]  [place]  [place] [address]  Fax: [phone]   Lyn Records III, M.D.  [address]  [address]  [place]  [place] [address]  Fax: [phone]

## 2010-09-25 NOTE — Cardiovascular Report (Signed)
[facility]  Patient:    Richard [name], Richard [name] Visit Number: [id number] MRN: [id number]          Service Type: CAT Location: [id number] Attending Physician:  Lyn Records. Iii Dictated by:   Darci Needle, M.D. Proc. Date: 09/19/01 Admit Date:  09/19/2001 Discharge Date: 09/19/2001   CC:         Reuben Likes, M.D.   Cardiac Catheterization  PROCEDURE:  Cardiac catheterization and percutaneous coronary intervention.  INDICATIONS:  Recent episodes of chest pain, new development of right bundle branch block, evidence of inferobasilar hypokinesia suggesting coronary artery disease.  PROCEDURE: 1. Left heart catheterization. 2. Selective coronary angiography. 3. Left ventriculography. 4. Angioplasty of the right coronary artery, unsuccessful due to failure to    cross with a guide wire.  DESCRIPTION OF PROCEDURE:  After informed consent, a 6 French sheath was inserted into the right femoral artery.  A 6 French A2 multipurpose catheter was used for hemodynamic recordings, left ventriculography, and selective left and right coronary angiography.  We then used a 6 [place] Judkins right catheter and gave 200 mcg of intracoronary nitroglycerin.  We noticed that the right coronary artery was totally occluded in the midsegment.  There were well-formed collaterals from the left coronary territory.  We gave an attempt at angioplasty of this chronic total occlusion.  We used a BMW and then a Crossit XT 200 wire, but were unable to cross the total occlusion.  He received 6500 units of IV heparin.  ACT was documented to be 299 during the attempted angioplasty.  No complications occurred.  RESULTS:  HEMODYNAMIC DATA:  Aortic pressure 137/67, left ventricular pressure 137/14.  LEFT VENTRICULOGRAPHY:  The left ventricle demonstrates possible mild inferior hypokinesis.  Overall LV function is normal.  EF is 60%.  SELECTIVE CORONARY ANGIOGRAPHY: 1.  Left main coronary artery is free of significant obstructive disease. The    left main is long. 2. Left anterior descending coronary artery is large.  It wraps around the    left ventricular apex.  It gives well-formed collaterals around the apex    to the PDA of the right coronary which fills freely via this collateral.    The LAD also gives origin to a large branching diagonal.  The ostium of    this diagonal is 60 to 70% obstructed.  The first branch of this vessel    contains a 70% obstruction. 3. Circumflex artery is relatively small.  It gives origin to a very small    obtuse marginal and a large left atrial recurrent. 4. Ramus intermedius branch.  The ramus arises from the distal left main.    It bifurcates along the left lateral wall, and is free of any significant    obstruction. 5. Right coronary artery is totally occluded in the midvessel after the    second marginal branch.  Between the first and second branch, there is a    70 to 80% stenosis.  PERCUTANEOUS CORONARY INTERVENTION:  We tried multiple wires in attempting to cross this stenosis in the mid right coronary artery total occlusion.  Because there was no stump and there was a sizable branch that arose at the total occlusion, we were never able to enter a tract in the occluded vessel and therefore were not able to recannalize the vessel.  We were unable to cross the total occlusion.  CONCLUSION: 1. Severe atherosclerotic heart disease with total occlusion of the mid  right coronary artery.  There are well-formed collaterals from the    left anterior descending to the right coronary artery.  The diagonal    contains moderate disease.  The circumflex and ramus branch are free of    any significant obstruction. 2. Mild left ventricular dysfunction with inferior wall hypokinesis. 3. Failure to recannalize the chronically totally occluded mid right coronary    artery due to failure to cross with guide wire.  PLAN:   Medical therapy including antiplatelet therapy with Plavix as the patient is allergic to aspirin.  We should also add a beta blocker.  He will go to 6500 and hopefully be discharged later today if his groin remains stable. Dictated by:   Darci Needle, M.D. Attending Physician:  Lyn Records. Iii DD:  09/19/01 TD:  09/20/01 Job: [id number]

## 2011-03-06 ENCOUNTER — Encounter: Payer: Self-pay | Admitting: *Deleted

## 2011-03-08 ENCOUNTER — Encounter (HOSPITAL_BASED_OUTPATIENT_CLINIC_OR_DEPARTMENT_OTHER): Payer: Medicare Other | Admitting: Internal Medicine

## 2011-03-08 ENCOUNTER — Other Ambulatory Visit: Payer: Self-pay | Admitting: Internal Medicine

## 2011-03-08 ENCOUNTER — Encounter: Payer: Self-pay | Admitting: Thoracic Surgery

## 2011-03-08 DIAGNOSIS — J984 Other disorders of lung: Secondary | ICD-10-CM

## 2011-03-08 DIAGNOSIS — C3410 Malignant neoplasm of upper lobe, unspecified bronchus or lung: Secondary | ICD-10-CM

## 2011-03-08 LAB — CBC WITH DIFFERENTIAL/PLATELET
Eosinophils Absolute: 0.2 10*3/uL (ref 0.0–0.5)
HCT: 37.7 % — ABNORMAL LOW (ref 38.4–49.9)
HGB: 12.4 g/dL — ABNORMAL LOW (ref 13.0–17.1)
LYMPH%: 8.9 % — ABNORMAL LOW (ref 14.0–49.0)
MONO#: 1.1 10*3/uL — ABNORMAL HIGH (ref 0.1–0.9)
NEUT#: 6.2 10*3/uL (ref 1.5–6.5)
NEUT%: 75.3 % — ABNORMAL HIGH (ref 39.0–75.0)
Platelets: 365 10*3/uL (ref 140–400)
WBC: 8.3 10*3/uL (ref 4.0–10.3)
lymph#: 0.7 10*3/uL — ABNORMAL LOW (ref 0.9–3.3)

## 2011-03-08 LAB — COMPREHENSIVE METABOLIC PANEL
CO2: 25 mEq/L (ref 19–32)
Calcium: 8.9 mg/dL (ref 8.4–10.5)
Chloride: 102 mEq/L (ref 96–112)
Creatinine, Ser: 0.79 mg/dL (ref 0.50–1.35)
Glucose, Bld: 93 mg/dL (ref 70–99)
Total Bilirubin: 0.5 mg/dL (ref 0.3–1.2)
Total Protein: 6.8 g/dL (ref 6.0–8.3)

## 2011-03-10 ENCOUNTER — Ambulatory Visit (HOSPITAL_COMMUNITY)
Admission: RE | Admit: 2011-03-10 | Discharge: 2011-03-10 | Disposition: A | Discharge status: Home / Self Care | Payer: Medicare Other | Source: Ambulatory Visit | Attending: Internal Medicine | Admitting: Internal Medicine

## 2011-03-10 DIAGNOSIS — C3490 Malignant neoplasm of unspecified part of unspecified bronchus or lung: Secondary | ICD-10-CM

## 2011-03-10 DIAGNOSIS — K862 Cyst of pancreas: Secondary | ICD-10-CM | POA: Insufficient documentation

## 2011-03-10 DIAGNOSIS — K863 Pseudocyst of pancreas: Secondary | ICD-10-CM | POA: Insufficient documentation

## 2011-03-10 DIAGNOSIS — Q619 Cystic kidney disease, unspecified: Secondary | ICD-10-CM | POA: Insufficient documentation

## 2011-03-10 DIAGNOSIS — J438 Other emphysema: Secondary | ICD-10-CM | POA: Insufficient documentation

## 2011-03-10 MED ORDER — IOHEXOL 300 MG/ML  SOLN
80.0000 mL | Freq: Once | INTRAMUSCULAR | Status: AC | PRN
  Administered 2011-03-10: 80 mL via INTRAVENOUS

## 2011-03-15 ENCOUNTER — Telehealth: Payer: Self-pay | Admitting: Internal Medicine

## 2011-03-15 ENCOUNTER — Ambulatory Visit (HOSPITAL_BASED_OUTPATIENT_CLINIC_OR_DEPARTMENT_OTHER): Payer: Medicare Other | Admitting: Internal Medicine

## 2011-03-15 ENCOUNTER — Encounter: Payer: Self-pay | Admitting: Internal Medicine

## 2011-03-15 VITALS — BP 107/58 | HR 49 | Temp 97.9°F | Wt 147.5 lb

## 2011-03-15 DIAGNOSIS — C3410 Malignant neoplasm of upper lobe, unspecified bronchus or lung: Secondary | ICD-10-CM

## 2011-03-15 DIAGNOSIS — R630 Anorexia: Secondary | ICD-10-CM

## 2011-03-15 DIAGNOSIS — C3490 Malignant neoplasm of unspecified part of unspecified bronchus or lung: Secondary | ICD-10-CM

## 2011-03-15 NOTE — Progress Notes (Signed)
Subjective: PRINCIPAL DIAGNOSIS:  Stage IA (T1bN0MX) non-small cell lung cancer, adenocarcinoma with bronchoalveolar features, diagnosed in March 2010.  PRIOR THERAPY:  Status post left upper lobectomy on October 24, 2008, under the care of Dr Edwyna Shell.  Interval history: The patient came today for his routine six-month followup visit. He is doing fine today he has no significant complaints. He mentions that he lost around 15 pounds since his last visit secondary to poor appetite. The patient denied having any significant dyspnea he denied having any chest pain no cough or hemoptysis. He has no nausea or vomiting no abdominal pain no diarrhea or constipation. The patient has a scan performed on 03/10/2011 and he is here today for evaluation and discussion of his scan results. Negative except loss of appetite and weight loss. Objective:  Filed Vitals:   03/15/11 0921  BP: 107/58  Pulse: 49  Temp: 97.9 F (36.6 C)    General appearance: alert, cooperative and no distress Resp: clear to auscultation bilaterally Cardio: regular rate and rhythm, S1, S2 normal, no murmur, click, rub or gallop GI: soft, non-tender; bowel sounds normal; no masses,  no organomegaly Extremities: extremities normal, atraumatic, no cyanosis or edema Mouth: Clear oropharynx.   Lab Results  Component Value Date   WBC 7.8 10/29/2008   HGB 12.4* 03/08/2011   HCT 37.7* 03/08/2011   MCV 87.1 03/08/2011   PLT 365 03/08/2011     Chemistry      Component Value Date/Time   NA 138 03/08/2011 1315   K 4.2 03/08/2011 1315   CL 102 03/08/2011 1315   CO2 25 03/08/2011 1315   BUN 10 03/08/2011 1315   CREATININE 0.79 03/08/2011 1315      Component Value Date/Time   CALCIUM 8.9 03/08/2011 1315   ALKPHOS 100 03/08/2011 1315   AST 13 03/08/2011 1315   ALT <8 03/08/2011 1315   BILITOT 0.5 03/08/2011 1315        Study Result     *RADIOLOGY REPORT*  Clinical Data: Lung cancer. Status post left upper lobectomy.  CT  CHEST WITH CONTRAST IMPRESSION:  Status post left upper lobectomy without evidence for new or  recurrent disease in the chest.  Clustered tiny nodules seen in the right upper lobe are stable in  the interval and may be scarring related to prior infection or  inflammation.  10 mm cystic lesion identified near the junction of the pancreatic  body and tail on today's lesion appears to represent a new finding.  The previously described cystic area in the uncinate process of the  pancreas is not included on today's chest CT. Attention to these  areas of follow-up imaging is recommended  Original Report Authenticated By: ERIC A. MANSELL, M.D.   Medications:  Current Outpatient Prescriptions  Medication Sig Dispense Refill  . amLODipine (NORVASC) 10 MG tablet Take 10 mg by mouth daily.        Marland Kitchen atorvastatin (LIPITOR) 80 MG tablet Take 80 mg by mouth daily.        . clopidogrel (PLAVIX) 75 MG tablet Take 75 mg by mouth daily.        Marland Kitchen ezetimibe-simvastatin (VYTORIN) 10-80 MG per tablet Take 1 tablet by mouth daily.        . Fluorometholone (FML OP) Apply 1 drop to eye daily.        . Lisinopril (ZESTRIL PO) Take 1 tablet by mouth daily.        . metoprolol succinate (TOPROL-XL) 25 MG 24 hr  tablet Take 25 mg by mouth daily.        . nitroGLYCERIN (NITROSTAT) 0.4 MG SL tablet Place 0.4 mg under the tongue every 5 (five) minutes as needed.        Marland Kitchen PRESCRIPTION MEDICATION Place 2 drops into the left eye daily. Tomagen Eye Drops         Assessment/Plan:  Stage IA non-small cell lung cancer, adenocarcinoma diagnosed in March of 2010 status post left upper lobectomy. The patient is doing fine, he has no evidence for disease recurrence. I discussed the scan results with the patient and his wife. I would see the patient back for followup visit in 6 months with repeat CT scan of the chest for restaging of his disease. Regarding the poor appetite, I gave the patient the option of starting steroid  treatment with solumedrol, but the patient was concerned about the side effects of the steroids and he would like to work on his diet for now.     Alaija Ruble K. 03/15/2011

## 2011-03-15 NOTE — Telephone Encounter (Signed)
gv pt appt schedule for April along w/ct for 4/25 @ 10 am @ [facility].

## 2011-03-15 NOTE — Progress Notes (Signed)
gv pt appt schedule for April w/ct scan for 4/25 @ 10 am @ [facility]. appt scheduled for April per pt due to he will be out of town in may.

## 2011-03-16 ENCOUNTER — Ambulatory Visit (INDEPENDENT_AMBULATORY_CARE_PROVIDER_SITE_OTHER): Payer: Medicare Other | Admitting: Thoracic Surgery

## 2011-03-16 ENCOUNTER — Encounter: Payer: Self-pay | Admitting: Thoracic Surgery

## 2011-03-16 VITALS — BP 145/71 | HR 58 | Resp 18 | Ht 70.0 in | Wt 150.0 lb

## 2011-03-16 DIAGNOSIS — C3410 Malignant neoplasm of upper lobe, unspecified bronchus or lung: Secondary | ICD-10-CM

## 2011-03-16 NOTE — Progress Notes (Signed)
HPI patient returns for followup. CT scan shows no evidence of recurrence. There was a small 10 mm cyst that was seen on the pancreas. He has no symptoms of pancreatitis. Plan to do this again with his next CT scan in 6 months   Current Outpatient Prescriptions  Medication Sig Dispense Refill  . amLODipine (NORVASC) 10 MG tablet Take 10 mg by mouth daily.        Marland Kitchen atorvastatin (LIPITOR) 80 MG tablet Take 80 mg by mouth daily.        . clopidogrel (PLAVIX) 75 MG tablet Take 75 mg by mouth daily.        . Fluorometholone (FML OP) Apply 1 drop to eye daily.        . metoprolol succinate (TOPROL-XL) 25 MG 24 hr tablet Take 25 mg by mouth daily.        . nitroGLYCERIN (NITROSTAT) 0.4 MG SL tablet Place 0.4 mg under the tongue every 5 (five) minutes as needed.        Marland Kitchen PRESCRIPTION MEDICATION Place 2 drops into the left eye daily. Tomagen Eye Drops          Review of Systems: Unchanged   Physical Exam  Cardiovascular: Normal rate, regular rhythm and normal heart sounds.   Pulmonary/Chest: Effort normal. No respiratory distress.     Diagnostic Tests: CT scan showed no evidence of recurrence some stable inflammatory nodules. Small cyst on kidneys and pancreas Impression: Left upper lobectomy for non-small cell lung cancer.   Plan return in 6 months with

## 2011-05-13 DIAGNOSIS — J209 Acute bronchitis, unspecified: Secondary | ICD-10-CM | POA: Diagnosis not present

## 2011-05-13 DIAGNOSIS — M545 Low back pain: Secondary | ICD-10-CM | POA: Diagnosis not present

## 2011-06-04 DIAGNOSIS — H4089 Other specified glaucoma: Secondary | ICD-10-CM | POA: Diagnosis not present

## 2011-06-30 DIAGNOSIS — R05 Cough: Secondary | ICD-10-CM | POA: Diagnosis not present

## 2011-06-30 DIAGNOSIS — I10 Essential (primary) hypertension: Secondary | ICD-10-CM | POA: Diagnosis not present

## 2011-06-30 DIAGNOSIS — J019 Acute sinusitis, unspecified: Secondary | ICD-10-CM | POA: Diagnosis not present

## 2011-06-30 DIAGNOSIS — J209 Acute bronchitis, unspecified: Secondary | ICD-10-CM | POA: Diagnosis not present

## 2011-08-12 DIAGNOSIS — I10 Essential (primary) hypertension: Secondary | ICD-10-CM | POA: Diagnosis not present

## 2011-08-12 DIAGNOSIS — E785 Hyperlipidemia, unspecified: Secondary | ICD-10-CM | POA: Diagnosis not present

## 2011-08-12 DIAGNOSIS — R05 Cough: Secondary | ICD-10-CM | POA: Diagnosis not present

## 2011-08-12 DIAGNOSIS — I495 Sick sinus syndrome: Secondary | ICD-10-CM | POA: Diagnosis not present

## 2011-08-12 DIAGNOSIS — R634 Abnormal weight loss: Secondary | ICD-10-CM | POA: Diagnosis not present

## 2011-08-12 DIAGNOSIS — I2510 Atherosclerotic heart disease of native coronary artery without angina pectoris: Secondary | ICD-10-CM | POA: Diagnosis not present

## 2011-08-16 DIAGNOSIS — H4089 Other specified glaucoma: Secondary | ICD-10-CM | POA: Diagnosis not present

## 2011-08-16 DIAGNOSIS — H4011X0 Primary open-angle glaucoma, stage unspecified: Secondary | ICD-10-CM | POA: Diagnosis not present

## 2011-08-24 DIAGNOSIS — Z0000 Encounter for general adult medical examination without abnormal findings: Secondary | ICD-10-CM | POA: Diagnosis not present

## 2011-08-24 DIAGNOSIS — Z1331 Encounter for screening for depression: Secondary | ICD-10-CM | POA: Diagnosis not present

## 2011-08-24 DIAGNOSIS — R634 Abnormal weight loss: Secondary | ICD-10-CM | POA: Diagnosis not present

## 2011-08-24 DIAGNOSIS — R5381 Other malaise: Secondary | ICD-10-CM | POA: Diagnosis not present

## 2011-08-24 DIAGNOSIS — I10 Essential (primary) hypertension: Secondary | ICD-10-CM | POA: Diagnosis not present

## 2011-08-25 ENCOUNTER — Telehealth: Payer: Self-pay | Admitting: Medical Oncology

## 2011-08-25 NOTE — Telephone Encounter (Signed)
Per Dr Donnald Garre Dr Valentina Lucks will need to order CT abdomen. Pt voices understanding.

## 2011-08-25 NOTE — Telephone Encounter (Signed)
Dr Valentina Lucks wanted pt to ask Dr Donnald Garre if he can add CT abdomen when he has CT chest on 4/25 to look at renal and liver cysts. I told him I will consult with Dr Donnald Garre.

## 2011-09-02 ENCOUNTER — Other Ambulatory Visit (HOSPITAL_BASED_OUTPATIENT_CLINIC_OR_DEPARTMENT_OTHER): Payer: Medicare Other | Admitting: Lab

## 2011-09-02 ENCOUNTER — Encounter (HOSPITAL_COMMUNITY): Payer: Self-pay

## 2011-09-02 ENCOUNTER — Ambulatory Visit (HOSPITAL_COMMUNITY)
Admission: RE | Admit: 2011-09-02 | Discharge: 2011-09-02 | Disposition: A | Discharge status: Home / Self Care | Payer: Medicare Other | Source: Ambulatory Visit | Attending: Internal Medicine | Admitting: Internal Medicine

## 2011-09-02 DIAGNOSIS — C3490 Malignant neoplasm of unspecified part of unspecified bronchus or lung: Secondary | ICD-10-CM

## 2011-09-02 DIAGNOSIS — Z902 Acquired absence of lung [part of]: Secondary | ICD-10-CM | POA: Diagnosis not present

## 2011-09-02 DIAGNOSIS — C3410 Malignant neoplasm of upper lobe, unspecified bronchus or lung: Secondary | ICD-10-CM

## 2011-09-02 DIAGNOSIS — R911 Solitary pulmonary nodule: Secondary | ICD-10-CM | POA: Diagnosis not present

## 2011-09-02 DIAGNOSIS — K449 Diaphragmatic hernia without obstruction or gangrene: Secondary | ICD-10-CM | POA: Insufficient documentation

## 2011-09-02 DIAGNOSIS — R634 Abnormal weight loss: Secondary | ICD-10-CM | POA: Diagnosis not present

## 2011-09-02 DIAGNOSIS — N289 Disorder of kidney and ureter, unspecified: Secondary | ICD-10-CM | POA: Insufficient documentation

## 2011-09-02 LAB — CMP (CANCER CENTER ONLY)
ALT(SGPT): 12 U/L (ref 10–47)
Albumin: 3 g/dL — ABNORMAL LOW (ref 3.3–5.5)
Alkaline Phosphatase: 89 U/L — ABNORMAL HIGH (ref 26–84)
Glucose, Bld: 115 mg/dL (ref 73–118)
Potassium: 4.9 mEq/L — ABNORMAL HIGH (ref 3.3–4.7)
Sodium: 141 mEq/L (ref 128–145)
Total Bilirubin: 0.6 mg/dl (ref 0.20–1.60)
Total Protein: 7 g/dL (ref 6.4–8.1)

## 2011-09-02 MED ORDER — IOHEXOL 300 MG/ML  SOLN
80.0000 mL | Freq: Once | INTRAMUSCULAR | Status: AC | PRN
  Administered 2011-09-02: 80 mL via INTRAVENOUS

## 2011-09-06 DIAGNOSIS — Z961 Presence of intraocular lens: Secondary | ICD-10-CM | POA: Diagnosis not present

## 2011-09-07 ENCOUNTER — Telehealth: Payer: Self-pay | Admitting: Internal Medicine

## 2011-09-07 ENCOUNTER — Ambulatory Visit (HOSPITAL_BASED_OUTPATIENT_CLINIC_OR_DEPARTMENT_OTHER): Payer: Medicare Other | Admitting: Internal Medicine

## 2011-09-07 VITALS — BP 137/79 | HR 62 | Temp 97.9°F | Ht 70.0 in | Wt 146.1 lb

## 2011-09-07 DIAGNOSIS — C3410 Malignant neoplasm of upper lobe, unspecified bronchus or lung: Secondary | ICD-10-CM | POA: Diagnosis not present

## 2011-09-07 DIAGNOSIS — C3490 Malignant neoplasm of unspecified part of unspecified bronchus or lung: Secondary | ICD-10-CM

## 2011-09-07 NOTE — Progress Notes (Signed)
[facility] [phone]   [phone]  OFFICE PROGRESS NOTE  Lillia Mountain, MD, MD [address], [address] [employer], [place]. [place] [address]  PRINCIPAL DIAGNOSIS: Stage IA (T1bN0MX) non-small cell lung cancer, adenocarcinoma with bronchoalveolar features, diagnosed in March 2010.   PRIOR THERAPY: Status post left upper lobectomy on October 24, 2008, under the care of Dr Edwyna Shell.  CURRENT THERAPY: Observation.  INTERVAL HISTORY: Richard [name] 77 y.o. male returns to the clinic today for followup visit accompanied by his wife. The patient is feeling fine today with no specific complaints. He denied having any significant chest pain or shortness breath, no cough or hemoptysis, no abdominal pain, no nausea or vomiting. He lost few pounds since his last visit. He has repeat CT scan of the chest performed recently and he is here today for evaluation and discussion of his scan results.  MEDICAL HISTORY: Past Medical History  Diagnosis Date  . Hypertension   . Coronary artery disease   . Hypercholesteremia   . Coronary artery stenosis   . lung ca dx'd 07/2008    ALLERGIES:  is allergic to aspirin.  MEDICATIONS:  Current Outpatient Prescriptions  Medication Sig Dispense Refill  . amLODipine (NORVASC) 10 MG tablet Take 5 mg by mouth daily.       Marland Kitchen atorvastatin (LIPITOR) 80 MG tablet Take 80 mg by mouth daily.        . clopidogrel (PLAVIX) 75 MG tablet Take 75 mg by mouth daily.        . dorzolamide-timolol (COSOPT) 22.3-6.8 MG/ML ophthalmic solution Place 1 drop into the left eye 2 (two) times daily.      . Fluorometholone (FML OP) Apply 1 drop to eye daily.        . nitroGLYCERIN (NITROSTAT) 0.4 MG SL tablet Place 0.4 mg under the tongue every 5 (five) minutes as needed.        Marland Kitchen PRESCRIPTION MEDICATION Place 2 drops into the left eye daily. Tomagen Eye Drops       . TIMOLOL HEMIHYDRATE OP Apply to eye 2 (two) times  daily. In both eyes      . metoprolol succinate (TOPROL-XL) 25 MG 24 hr tablet Take 25 mg by mouth daily.          SURGICAL HISTORY:  Past Surgical History  Procedure Date  . Corneal transplant   . Bilateral inguinal hernia repair   . Left video-assisted thoracic surgery, mini- 10/24/2008    REVIEW OF SYSTEMS:  A comprehensive review of systems was negative.   PHYSICAL EXAMINATION: General appearance: alert, cooperative and no distress Head: Normocephalic, without obvious abnormality, atraumatic Neck: no adenopathy Lymph nodes: Cervical, supraclavicular, and axillary nodes normal. Resp: clear to auscultation bilaterally Cardio: regular rate and rhythm, S1, S2 normal, no murmur, click, rub or gallop GI: soft, non-tender; bowel sounds normal; no masses,  no organomegaly Extremities: extremities normal, atraumatic, no cyanosis or edema Neurologic: Alert and oriented X 3, normal strength and tone. Normal symmetric reflexes. Normal coordination and gait  ECOG PERFORMANCE STATUS: 0 - Asymptomatic  Blood pressure 137/79, pulse 62, temperature 97.9 F (36.6 C), temperature source Oral, height 5\' 10"  (1.778 m), weight 146 lb 1.6 oz (66.271 kg).  LABORATORY DATA: Lab Results  Component Value Date   WBC 8.3 03/08/2011   HGB 12.4* 03/08/2011   HCT 37.7* 03/08/2011   MCV 87.1 03/08/2011   PLT 365 03/08/2011  Chemistry      Component Value Date/Time   NA 141 09/02/2011 0850   NA 138 03/08/2011 1315   K 4.9* 09/02/2011 0850   K 4.2 03/08/2011 1315   CL 100 09/02/2011 0850   CL 102 03/08/2011 1315   CO2 28 09/02/2011 0850   CO2 25 03/08/2011 1315   BUN 13 09/02/2011 0850   BUN 10 03/08/2011 1315   CREATININE 1.1 09/02/2011 0850   CREATININE 0.79 03/08/2011 1315      Component Value Date/Time   CALCIUM 8.5 09/02/2011 0850   CALCIUM 8.9 03/08/2011 1315   ALKPHOS 89* 09/02/2011 0850   ALKPHOS 100 03/08/2011 1315   AST 18 09/02/2011 0850   AST 13 03/08/2011 1315   ALT <8  03/08/2011 1315   BILITOT 0.60 09/02/2011 0850   BILITOT 0.5 03/08/2011 1315       RADIOGRAPHIC STUDIES: Ct Chest W Contrast  09/02/2011  *RADIOLOGY REPORT*  Clinical Data: Lung cancer.  Weight loss.  CT CHEST WITH CONTRAST  Technique:  Multidetector CT imaging of the chest was performed following the standard protocol during bolus administration of intravenous contrast.  Contrast: 80mL OMNIPAQUE IOHEXOL 300 MG/ML  SOLN  Comparison: 03/10/2011.  Findings: Left supraclavicular lymph nodes are sub centimeter in size, as before.  No pathologically enlarged mediastinal lymph nodes.  Right hilar lymph node measures 10 mm, stable.  No left hilar or axillary adenopathy.  Heart size normal.  No pericardial effusion.  Tiny hiatal hernia.  Peribronchovascular nodularity in the right lung is unchanged from previous.  There is new centrilobular ground-glass nodularity in the left lower lobe.  Postoperative changes of left upper lobectomy.  No pleural fluid.  Airway is otherwise unremarkable.  Incidental imaging of the upper abdomen shows a 2.8 cm low attenuation lesion in the left renal sinus, as before, likely a cyst.  Retroperitoneal lymph nodes are sub centimeter in short axis size. Previously seen low attenuation pancreatic lesion is not visualized on the current study.  No worrisome lytic or sclerotic lesions.  Degenerative changes are seen in the spine.  IMPRESSION:  1.  Postoperative changes of left upper lobectomy without evidence of recurrent or metastatic disease. 2.  New ill-defined peribronchovascular nodularity in the left lower lobe likely represents an infectious bronchiolitis. 3.  Previously seen low attenuation pancreatic lesion is poorly visualized today.  Original Report Authenticated By: Reyes Ivan, M.D.    ASSESSMENT: This is a very pleasant 77 years old white male with history of stage IA non-small cell lung cancer status post left upper lobectomy and currently on observation.  PLAN:  The patient is doing fine and he has no evidence for disease recurrence on his recent scan. It has been more than 3 years since his diagnosis with lung cancer. I recommended for him continuous observation with repeat CT scan of the chest in one year and he would come back for followup visit at that time. He was advised to call me immediately if he has any concerning symptoms in the interval.  All questions were answered. The patient knows to call the clinic with any problems, questions or concerns. We can certainly see the patient much sooner if necessary.

## 2011-09-07 NOTE — Telephone Encounter (Signed)
appts made and printed for pt aom °

## 2011-09-14 ENCOUNTER — Ambulatory Visit: Payer: Medicare Other | Admitting: Surgery

## 2011-09-14 ENCOUNTER — Ambulatory Visit: Payer: Medicare Other | Admitting: Thoracic Surgery

## 2011-09-15 ENCOUNTER — Ambulatory Visit (INDEPENDENT_AMBULATORY_CARE_PROVIDER_SITE_OTHER): Payer: Medicare Other | Admitting: Thoracic Surgery

## 2011-09-15 ENCOUNTER — Ambulatory Visit: Payer: Medicare Other | Admitting: Thoracic Surgery

## 2011-09-15 ENCOUNTER — Encounter: Payer: Self-pay | Admitting: Thoracic Surgery

## 2011-09-15 VITALS — BP 154/75 | HR 84 | Resp 18 | Ht 70.0 in | Wt 150.0 lb

## 2011-09-15 DIAGNOSIS — C3410 Malignant neoplasm of upper lobe, unspecified bronchus or lung: Secondary | ICD-10-CM

## 2011-09-15 NOTE — Progress Notes (Signed)
HPI break current for followup this is now 3 years since his resection of adenocarcinoma left upper lobe. This is a stage IA. CT scan done 2 weeks ago showed no evidence of recurrence. He can follow by Dr. Gwenyth Bouillon. He will see him again in one year. He is doing well overall with no change in his medical status. We will let him be followed by Dr. Gwenyth Bouillon   Current Outpatient Prescriptions  Medication Sig Dispense Refill  . amLODipine (NORVASC) 10 MG tablet Take 5 mg by mouth daily.       Marland Kitchen atorvastatin (LIPITOR) 80 MG tablet Take 80 mg by mouth daily.        . clopidogrel (PLAVIX) 75 MG tablet Take 75 mg by mouth daily.        . dorzolamide-timolol (COSOPT) 22.3-6.8 MG/ML ophthalmic solution Place 1 drop into the left eye 2 (two) times daily.      . Fluorometholone (FML OP) Apply 1 drop to eye daily.        Marland Kitchen latanoprost (XALATAN) 0.005 % ophthalmic solution Place 1 drop into both eyes daily.       . nitroGLYCERIN (NITROSTAT) 0.4 MG SL tablet Place 0.4 mg under the tongue every 5 (five) minutes as needed.        Marland Kitchen PRESCRIPTION MEDICATION Place 2 drops into the left eye daily. Tomagen Eye Drops       . TIMOLOL HEMIHYDRATE OP Apply to eye 2 (two) times daily. In both eyes         Review of Systems: Unchanged  Physical Exam lungs are clear to auscultation and percussion   Diagnostic Tests: CT scan showed no evidence of recurrence of his lung cancer   Impression: Status post left upper lobectomy for stage IA non-small cell lung cancer adenocarcinoma   Plan: Followup by Dr. Gwenyth Bouillon

## 2011-09-24 DIAGNOSIS — H9190 Unspecified hearing loss, unspecified ear: Secondary | ICD-10-CM | POA: Diagnosis not present

## 2011-09-24 DIAGNOSIS — I10 Essential (primary) hypertension: Secondary | ICD-10-CM | POA: Diagnosis not present

## 2011-09-24 DIAGNOSIS — H6120 Impacted cerumen, unspecified ear: Secondary | ICD-10-CM | POA: Diagnosis not present

## 2011-09-29 DIAGNOSIS — H6120 Impacted cerumen, unspecified ear: Secondary | ICD-10-CM | POA: Diagnosis not present

## 2011-09-29 DIAGNOSIS — H903 Sensorineural hearing loss, bilateral: Secondary | ICD-10-CM | POA: Diagnosis not present

## 2011-11-02 DIAGNOSIS — Z947 Corneal transplant status: Secondary | ICD-10-CM | POA: Diagnosis not present

## 2011-11-02 DIAGNOSIS — H4011X0 Primary open-angle glaucoma, stage unspecified: Secondary | ICD-10-CM | POA: Diagnosis not present

## 2012-01-03 DIAGNOSIS — E785 Hyperlipidemia, unspecified: Secondary | ICD-10-CM | POA: Diagnosis not present

## 2012-01-05 ENCOUNTER — Other Ambulatory Visit: Payer: Self-pay | Admitting: Gastroenterology

## 2012-01-05 DIAGNOSIS — K5730 Diverticulosis of large intestine without perforation or abscess without bleeding: Secondary | ICD-10-CM | POA: Diagnosis not present

## 2012-01-05 DIAGNOSIS — Z1211 Encounter for screening for malignant neoplasm of colon: Secondary | ICD-10-CM | POA: Diagnosis not present

## 2012-01-05 DIAGNOSIS — D126 Benign neoplasm of colon, unspecified: Secondary | ICD-10-CM | POA: Diagnosis not present

## 2012-02-03 DIAGNOSIS — H4011X0 Primary open-angle glaucoma, stage unspecified: Secondary | ICD-10-CM | POA: Diagnosis not present

## 2012-02-03 DIAGNOSIS — Z961 Presence of intraocular lens: Secondary | ICD-10-CM | POA: Diagnosis not present

## 2012-02-03 DIAGNOSIS — H409 Unspecified glaucoma: Secondary | ICD-10-CM | POA: Diagnosis not present

## 2012-02-22 DIAGNOSIS — H1820 Unspecified corneal edema: Secondary | ICD-10-CM | POA: Diagnosis not present

## 2012-02-22 DIAGNOSIS — Z961 Presence of intraocular lens: Secondary | ICD-10-CM | POA: Diagnosis not present

## 2012-02-22 DIAGNOSIS — Z947 Corneal transplant status: Secondary | ICD-10-CM | POA: Diagnosis not present

## 2012-03-16 DIAGNOSIS — R21 Rash and other nonspecific skin eruption: Secondary | ICD-10-CM | POA: Diagnosis not present

## 2012-03-27 ENCOUNTER — Other Ambulatory Visit: Payer: Self-pay | Admitting: Internal Medicine

## 2012-03-27 DIAGNOSIS — N281 Cyst of kidney, acquired: Secondary | ICD-10-CM

## 2012-03-27 DIAGNOSIS — C61 Malignant neoplasm of prostate: Secondary | ICD-10-CM | POA: Diagnosis not present

## 2012-03-27 DIAGNOSIS — Z1331 Encounter for screening for depression: Secondary | ICD-10-CM | POA: Diagnosis not present

## 2012-03-27 DIAGNOSIS — Z0000 Encounter for general adult medical examination without abnormal findings: Secondary | ICD-10-CM | POA: Diagnosis not present

## 2012-03-27 DIAGNOSIS — I10 Essential (primary) hypertension: Secondary | ICD-10-CM | POA: Diagnosis not present

## 2012-03-27 DIAGNOSIS — Z87891 Personal history of nicotine dependence: Secondary | ICD-10-CM

## 2012-03-29 ENCOUNTER — Ambulatory Visit
Admission: RE | Admit: 2012-03-29 | Discharge: 2012-03-29 | Disposition: A | Discharge status: Home / Self Care | Payer: Medicare Other | Source: Ambulatory Visit | Attending: Internal Medicine | Admitting: Internal Medicine

## 2012-03-29 DIAGNOSIS — I10 Essential (primary) hypertension: Secondary | ICD-10-CM | POA: Diagnosis not present

## 2012-03-29 DIAGNOSIS — Z87891 Personal history of nicotine dependence: Secondary | ICD-10-CM | POA: Diagnosis not present

## 2012-03-29 DIAGNOSIS — N281 Cyst of kidney, acquired: Secondary | ICD-10-CM

## 2012-04-13 DIAGNOSIS — H409 Unspecified glaucoma: Secondary | ICD-10-CM | POA: Diagnosis not present

## 2012-04-13 DIAGNOSIS — H4089 Other specified glaucoma: Secondary | ICD-10-CM | POA: Diagnosis not present

## 2012-04-13 DIAGNOSIS — H4011X0 Primary open-angle glaucoma, stage unspecified: Secondary | ICD-10-CM | POA: Diagnosis not present

## 2012-04-17 DIAGNOSIS — B352 Tinea manuum: Secondary | ICD-10-CM | POA: Diagnosis not present

## 2012-08-10 DIAGNOSIS — I495 Sick sinus syndrome: Secondary | ICD-10-CM | POA: Diagnosis not present

## 2012-08-10 DIAGNOSIS — I4510 Unspecified right bundle-branch block: Secondary | ICD-10-CM | POA: Diagnosis not present

## 2012-08-10 DIAGNOSIS — I10 Essential (primary) hypertension: Secondary | ICD-10-CM | POA: Diagnosis not present

## 2012-08-10 DIAGNOSIS — E785 Hyperlipidemia, unspecified: Secondary | ICD-10-CM | POA: Diagnosis not present

## 2012-08-10 DIAGNOSIS — I2510 Atherosclerotic heart disease of native coronary artery without angina pectoris: Secondary | ICD-10-CM | POA: Diagnosis not present

## 2012-09-04 ENCOUNTER — Ambulatory Visit (HOSPITAL_COMMUNITY)
Admission: RE | Admit: 2012-09-04 | Discharge: 2012-09-04 | Disposition: A | Discharge status: Home / Self Care | Payer: Medicare Other | Source: Ambulatory Visit | Attending: Internal Medicine | Admitting: Internal Medicine

## 2012-09-04 ENCOUNTER — Other Ambulatory Visit (HOSPITAL_BASED_OUTPATIENT_CLINIC_OR_DEPARTMENT_OTHER): Payer: Medicare Other

## 2012-09-04 DIAGNOSIS — C3410 Malignant neoplasm of upper lobe, unspecified bronchus or lung: Secondary | ICD-10-CM

## 2012-09-04 DIAGNOSIS — Z902 Acquired absence of lung [part of]: Secondary | ICD-10-CM | POA: Diagnosis not present

## 2012-09-04 DIAGNOSIS — I2510 Atherosclerotic heart disease of native coronary artery without angina pectoris: Secondary | ICD-10-CM | POA: Diagnosis not present

## 2012-09-04 DIAGNOSIS — C3490 Malignant neoplasm of unspecified part of unspecified bronchus or lung: Secondary | ICD-10-CM | POA: Diagnosis not present

## 2012-09-04 LAB — COMPREHENSIVE METABOLIC PANEL (CC13)
AST: 14 U/L (ref 5–34)
Albumin: 3.1 g/dL — ABNORMAL LOW (ref 3.5–5.0)
Alkaline Phosphatase: 122 U/L (ref 40–150)
BUN: 13.1 mg/dL (ref 7.0–26.0)
Calcium: 8.6 mg/dL (ref 8.4–10.4)
Chloride: 105 mEq/L (ref 98–107)
Glucose: 106 mg/dl — ABNORMAL HIGH (ref 70–99)
Potassium: 4.2 mEq/L (ref 3.5–5.1)
Sodium: 139 mEq/L (ref 136–145)
Total Protein: 7 g/dL (ref 6.4–8.3)

## 2012-09-04 LAB — CBC WITH DIFFERENTIAL/PLATELET
Basophils Absolute: 0.1 10*3/uL (ref 0.0–0.1)
Eosinophils Absolute: 0.2 10*3/uL (ref 0.0–0.5)
HGB: 12.7 g/dL — ABNORMAL LOW (ref 13.0–17.1)
MCV: 88.6 fL (ref 79.3–98.0)
MONO%: 14.1 % — ABNORMAL HIGH (ref 0.0–14.0)
NEUT#: 6.6 10*3/uL — ABNORMAL HIGH (ref 1.5–6.5)
RBC: 4.43 10*6/uL (ref 4.20–5.82)
RDW: 16 % — ABNORMAL HIGH (ref 11.0–14.6)
WBC: 8.8 10*3/uL (ref 4.0–10.3)
lymph#: 0.7 10*3/uL — ABNORMAL LOW (ref 0.9–3.3)

## 2012-09-04 MED ORDER — IOHEXOL 300 MG/ML  SOLN
80.0000 mL | Freq: Once | INTRAMUSCULAR | Status: AC | PRN
  Administered 2012-09-04: 80 mL via INTRAVENOUS

## 2012-09-06 ENCOUNTER — Encounter: Payer: Self-pay | Admitting: Internal Medicine

## 2012-09-06 ENCOUNTER — Ambulatory Visit (HOSPITAL_BASED_OUTPATIENT_CLINIC_OR_DEPARTMENT_OTHER): Payer: Medicare Other | Admitting: Internal Medicine

## 2012-09-06 ENCOUNTER — Telehealth: Payer: Self-pay | Admitting: Internal Medicine

## 2012-09-06 VITALS — BP 124/65 | HR 76 | Temp 98.8°F | Resp 20 | Ht 70.0 in | Wt 147.0 lb

## 2012-09-06 DIAGNOSIS — C3410 Malignant neoplasm of upper lobe, unspecified bronchus or lung: Secondary | ICD-10-CM

## 2012-09-06 DIAGNOSIS — C3490 Malignant neoplasm of unspecified part of unspecified bronchus or lung: Secondary | ICD-10-CM

## 2012-09-06 NOTE — Progress Notes (Signed)
[facility] [phone]   Fax:([phone]) [phone]  OFFICE PROGRESS NOTE  Lillia Mountain, MD [address], [address] [employer], [place]. [place] [address]  PRINCIPAL DIAGNOSIS: Stage IA (T1bN0MX) non-small cell lung cancer, adenocarcinoma with bronchoalveolar features, diagnosed in March 2010.   PRIOR THERAPY: Status post left upper lobectomy on October 24, 2008, under the care of Dr Edwyna Shell.   CURRENT THERAPY: Observation.  INTERVAL HISTORY: Richard [name] 78 y.o. male returns to the clinic today for routine annual followup visit. The patient has no complaints today. He denied having any significant chest pain, shortness breath, cough or hemoptysis. He has no nausea or vomiting. He has no weight loss or night sweats. He has repeat CT scan of the chest performed recently and he is here today for evaluation and discussion of his scan results.  MEDICAL HISTORY: Past Medical History  Diagnosis Date  . Hypertension   . Coronary artery disease   . Hypercholesteremia   . Coronary artery stenosis   . lung ca dx'd 07/2008    ALLERGIES:  is allergic to aspirin.  MEDICATIONS:  Current Outpatient Prescriptions  Medication Sig Dispense Refill  . amLODipine (NORVASC) 10 MG tablet Take 5 mg by mouth daily.       Marland Kitchen atorvastatin (LIPITOR) 80 MG tablet Take 80 mg by mouth daily.        . clopidogrel (PLAVIX) 75 MG tablet Take 75 mg by mouth daily.        . dorzolamide-timolol (COSOPT) 22.3-6.8 MG/ML ophthalmic solution Place 1 drop into the left eye 2 (two) times daily.      . Fluorometholone (FML OP) Apply 1 drop to eye daily.        Marland Kitchen latanoprost (XALATAN) 0.005 % ophthalmic solution Place 1 drop into both eyes daily.       . nitroGLYCERIN (NITROSTAT) 0.4 MG SL tablet Place 0.4 mg under the tongue every 5 (five) minutes as needed.        Marland Kitchen PRESCRIPTION MEDICATION Place 2 drops into the left eye daily. Tomagen Eye Drops       . TIMOLOL  HEMIHYDRATE OP Apply to eye 2 (two) times daily. In both eyes       No current facility-administered medications for this visit.    SURGICAL HISTORY:  Past Surgical History  Procedure Laterality Date  . Corneal transplant    . Bilateral inguinal hernia repair    . Left video-assisted thoracic surgery, mini-  10/24/2008    REVIEW OF SYSTEMS:  A comprehensive review of systems was negative.   PHYSICAL EXAMINATION: General appearance: alert, cooperative and no distress Head: Normocephalic, without obvious abnormality, atraumatic Neck: no adenopathy Lymph nodes: Cervical, supraclavicular, and axillary nodes normal. Resp: clear to auscultation bilaterally Cardio: regular rate and rhythm, S1, S2 normal, no murmur, click, rub or gallop GI: soft, non-tender; bowel sounds normal; no masses,  no organomegaly Extremities: extremities normal, atraumatic, no cyanosis or edema  ECOG PERFORMANCE STATUS: 1 - Symptomatic but completely ambulatory  Blood pressure 124/65, pulse 76, temperature 98.8 F (37.1 C), temperature source Oral, resp. rate 20, height 5\' 10"  (1.778 m), weight 147 lb (66.679 kg).  LABORATORY DATA: Lab Results  Component Value Date   WBC 8.8 09/04/2012   HGB 12.7* 09/04/2012   HCT 39.2 09/04/2012   MCV 88.6 09/04/2012   PLT 264 09/04/2012      Chemistry      Component Value Date/Time  NA 139 09/04/2012 0813   NA 141 09/02/2011 0850   NA 138 03/08/2011 1315   K 4.2 09/04/2012 0813   K 4.9* 09/02/2011 0850   K 4.2 03/08/2011 1315   CL 105 09/04/2012 0813   CL 100 09/02/2011 0850   CL 102 03/08/2011 1315   CO2 24 09/04/2012 0813   CO2 28 09/02/2011 0850   CO2 25 03/08/2011 1315   BUN 13.1 09/04/2012 0813   BUN 13 09/02/2011 0850   BUN 10 03/08/2011 1315   CREATININE 0.8 09/04/2012 0813   CREATININE 1.1 09/02/2011 0850   CREATININE 0.79 03/08/2011 1315      Component Value Date/Time   CALCIUM 8.6 09/04/2012 0813   CALCIUM 8.5 09/02/2011 0850   CALCIUM 8.9 03/08/2011 1315     ALKPHOS 122 09/04/2012 0813   ALKPHOS 89* 09/02/2011 0850   ALKPHOS 100 03/08/2011 1315   AST 14 09/04/2012 0813   AST 18 09/02/2011 0850   AST 13 03/08/2011 1315   ALT 9 09/04/2012 0813   ALT <8 03/08/2011 1315   BILITOT 0.44 09/04/2012 0813   BILITOT 0.60 09/02/2011 0850   BILITOT 0.5 03/08/2011 1315       RADIOGRAPHIC STUDIES: Ct Chest W Contrast  09/04/2012  *RADIOLOGY REPORT*  Clinical Data: Left-sided lung cancer diagnosed 3/10 with left upper lobectomy.  CT CHEST WITH CONTRAST  Technique:  Multidetector CT imaging of the chest was performed following the standard protocol during bolus administration of intravenous contrast.  Contrast: 80mL OMNIPAQUE IOHEXOL 300 MG/ML  SOLN  Comparison: 09/02/2011  Findings: Lungs/pleura: Surgical changes of left upper lobectomy. Right upper and less so left lower lobe micro nodularity, similar to on the prior exam. No pleural fluid.  Heart/Mediastinum: Small stable left supraclavicular lymph nodes. Tortuous descending thoracic aorta.  Normal heart size with coronary artery atherosclerosis.  No pericardial fluid. No central pulmonary embolism, on this non-dedicated study.  No mediastinal or hilar adenopathy.  Upper abdomen: Normal adrenal glands.  Central left renal low density lesion which is likely a cyst.  This is incompletely imaged.  Bones/Musculoskeletal:  No acute osseous abnormality.  Lower thoracic spondylosis.  IMPRESSION:  1.  Status post left upper lobectomy, without evidence of recurrent or metastatic disease. 2.  Redemonstration of bilateral, right greater than left, micro nodularity, most consistent with an infectious bronchiolitis.   Original Report Authenticated By: Jeronimo Greaves, M.D.     ASSESSMENT: This is a very pleasant 78 years old white male with history of stage IA non-small cell lung cancer status post left upper lobectomy and has been observation since July 2010 with no evidence for disease recurrence.   PLAN: I discussed the scan  results with the patient. I recommended for him to continue on observation with repeat CT scan of the chest without contrast in one year. He was advised to call me immediately if he has any concerning symptoms in the interval.  All questions were answered. The patient knows to call the clinic with any problems, questions or concerns. We can certainly see the patient much sooner if necessary.

## 2012-09-06 NOTE — Patient Instructions (Signed)
No evidence for disease recurrence on the recent scan. Followup visit in one year with repeat CT scan of the chest 

## 2012-09-06 NOTE — Telephone Encounter (Signed)
gv and pritned appt sched and avs for pt for April 2015....advised pt cs will contact with d/t for ct.Marland KitchenMarland KitchenMarland Kitchen

## 2012-09-06 NOTE — Addendum Note (Signed)
Addended by: Charma Igo on: 09/06/2012 09:43 AM   Modules accepted: Orders

## 2012-10-05 DIAGNOSIS — H4011X0 Primary open-angle glaucoma, stage unspecified: Secondary | ICD-10-CM | POA: Diagnosis not present

## 2012-10-05 DIAGNOSIS — Z961 Presence of intraocular lens: Secondary | ICD-10-CM | POA: Diagnosis not present

## 2012-10-05 DIAGNOSIS — H409 Unspecified glaucoma: Secondary | ICD-10-CM | POA: Diagnosis not present

## 2012-11-21 DIAGNOSIS — H4011X0 Primary open-angle glaucoma, stage unspecified: Secondary | ICD-10-CM | POA: Diagnosis not present

## 2013-02-03 ENCOUNTER — Other Ambulatory Visit: Payer: Self-pay | Admitting: Interventional Cardiology

## 2013-02-03 DIAGNOSIS — E785 Hyperlipidemia, unspecified: Secondary | ICD-10-CM

## 2013-02-03 DIAGNOSIS — Z79899 Other long term (current) drug therapy: Secondary | ICD-10-CM

## 2013-02-14 ENCOUNTER — Ambulatory Visit (INDEPENDENT_AMBULATORY_CARE_PROVIDER_SITE_OTHER): Payer: Medicare Other | Admitting: *Deleted

## 2013-02-14 DIAGNOSIS — Z79899 Other long term (current) drug therapy: Secondary | ICD-10-CM | POA: Diagnosis not present

## 2013-02-14 DIAGNOSIS — E785 Hyperlipidemia, unspecified: Secondary | ICD-10-CM

## 2013-02-14 LAB — ALT: ALT: 10 U/L (ref 0–53)

## 2013-02-14 LAB — LIPID PANEL
Total CHOL/HDL Ratio: 3
Triglycerides: 42 mg/dL (ref 0.0–149.0)

## 2013-02-19 ENCOUNTER — Telehealth: Payer: Self-pay

## 2013-02-19 NOTE — Telephone Encounter (Signed)
spoke with pt wife labs acceptable repeat in 1 yr .pt wife verbalized understanding

## 2013-02-19 NOTE — Telephone Encounter (Signed)
Message copied by Jarvis Newcomer on Mon Feb 19, 2013 10:31 AM ------      Message from: Verdis Prime      Created: Sun Feb 18, 2013  4:45 PM       Acceptable. Repeat 1 year, Dx.  272.4 ------

## 2013-03-06 DIAGNOSIS — H1820 Unspecified corneal edema: Secondary | ICD-10-CM | POA: Diagnosis not present

## 2013-03-06 DIAGNOSIS — Z947 Corneal transplant status: Secondary | ICD-10-CM | POA: Diagnosis not present

## 2013-03-28 DIAGNOSIS — Z1331 Encounter for screening for depression: Secondary | ICD-10-CM | POA: Diagnosis not present

## 2013-03-28 DIAGNOSIS — Z8546 Personal history of malignant neoplasm of prostate: Secondary | ICD-10-CM | POA: Diagnosis not present

## 2013-03-28 DIAGNOSIS — I10 Essential (primary) hypertension: Secondary | ICD-10-CM | POA: Diagnosis not present

## 2013-04-24 DIAGNOSIS — H409 Unspecified glaucoma: Secondary | ICD-10-CM | POA: Diagnosis not present

## 2013-04-24 DIAGNOSIS — Z947 Corneal transplant status: Secondary | ICD-10-CM | POA: Diagnosis not present

## 2013-04-24 DIAGNOSIS — H1820 Unspecified corneal edema: Secondary | ICD-10-CM | POA: Diagnosis not present

## 2013-05-01 DIAGNOSIS — H1820 Unspecified corneal edema: Secondary | ICD-10-CM | POA: Diagnosis not present

## 2013-05-01 DIAGNOSIS — Z947 Corneal transplant status: Secondary | ICD-10-CM | POA: Diagnosis not present

## 2013-05-07 DIAGNOSIS — J209 Acute bronchitis, unspecified: Secondary | ICD-10-CM | POA: Diagnosis not present

## 2013-05-15 DIAGNOSIS — H1820 Unspecified corneal edema: Secondary | ICD-10-CM | POA: Diagnosis not present

## 2013-05-15 DIAGNOSIS — Z947 Corneal transplant status: Secondary | ICD-10-CM | POA: Diagnosis not present

## 2013-05-15 DIAGNOSIS — H409 Unspecified glaucoma: Secondary | ICD-10-CM | POA: Diagnosis not present

## 2013-05-15 DIAGNOSIS — H4011X0 Primary open-angle glaucoma, stage unspecified: Secondary | ICD-10-CM | POA: Diagnosis not present

## 2013-05-23 DIAGNOSIS — C3490 Malignant neoplasm of unspecified part of unspecified bronchus or lung: Secondary | ICD-10-CM | POA: Diagnosis not present

## 2013-05-23 DIAGNOSIS — I2510 Atherosclerotic heart disease of native coronary artery without angina pectoris: Secondary | ICD-10-CM | POA: Diagnosis not present

## 2013-05-23 DIAGNOSIS — R05 Cough: Secondary | ICD-10-CM | POA: Diagnosis not present

## 2013-05-23 DIAGNOSIS — R059 Cough, unspecified: Secondary | ICD-10-CM | POA: Diagnosis not present

## 2013-06-27 ENCOUNTER — Other Ambulatory Visit: Payer: Self-pay

## 2013-06-27 MED ORDER — ATORVASTATIN CALCIUM 80 MG PO TABS: 80.0000 mg | ORAL_TABLET | Freq: Every day | ORAL | Status: DC

## 2013-07-10 ENCOUNTER — Encounter: Payer: Self-pay | Admitting: Interventional Cardiology

## 2013-07-20 ENCOUNTER — Telehealth: Payer: Self-pay | Admitting: Interventional Cardiology

## 2013-07-20 DIAGNOSIS — E785 Hyperlipidemia, unspecified: Secondary | ICD-10-CM

## 2013-07-20 NOTE — Telephone Encounter (Signed)
New Message:  Pt is requesting to have his cholesterol checked the same day of his 4/23 appt. However, there are no orders for a lipid venipuncture. Pt is asking to speak with the nurse.

## 2013-07-20 NOTE — Telephone Encounter (Signed)
No answer, unable to leave message °

## 2013-07-24 NOTE — Telephone Encounter (Signed)
called pt.pt aware fasting labs can be drawn the morning of appt with Dr.Smith on 08/30/13.pt agreeable with plan and verbalized understanding.

## 2013-07-31 DIAGNOSIS — H4089 Other specified glaucoma: Secondary | ICD-10-CM | POA: Diagnosis not present

## 2013-07-31 DIAGNOSIS — H16019 Central corneal ulcer, unspecified eye: Secondary | ICD-10-CM | POA: Diagnosis not present

## 2013-08-01 DIAGNOSIS — H16019 Central corneal ulcer, unspecified eye: Secondary | ICD-10-CM | POA: Diagnosis not present

## 2013-08-02 DIAGNOSIS — H16019 Central corneal ulcer, unspecified eye: Secondary | ICD-10-CM | POA: Diagnosis not present

## 2013-08-05 ENCOUNTER — Encounter: Payer: Self-pay | Admitting: *Deleted

## 2013-08-07 DIAGNOSIS — Z947 Corneal transplant status: Secondary | ICD-10-CM | POA: Diagnosis not present

## 2013-08-07 DIAGNOSIS — H1820 Unspecified corneal edema: Secondary | ICD-10-CM | POA: Diagnosis not present

## 2013-08-07 DIAGNOSIS — H16019 Central corneal ulcer, unspecified eye: Secondary | ICD-10-CM | POA: Diagnosis not present

## 2013-08-09 DIAGNOSIS — H1820 Unspecified corneal edema: Secondary | ICD-10-CM | POA: Diagnosis not present

## 2013-08-09 DIAGNOSIS — H16019 Central corneal ulcer, unspecified eye: Secondary | ICD-10-CM | POA: Diagnosis not present

## 2013-08-09 DIAGNOSIS — Z947 Corneal transplant status: Secondary | ICD-10-CM | POA: Diagnosis not present

## 2013-08-14 DIAGNOSIS — H16019 Central corneal ulcer, unspecified eye: Secondary | ICD-10-CM | POA: Diagnosis not present

## 2013-08-14 DIAGNOSIS — B953 Streptococcus pneumoniae as the cause of diseases classified elsewhere: Secondary | ICD-10-CM | POA: Diagnosis not present

## 2013-08-15 ENCOUNTER — Ambulatory Visit: Payer: Medicare Other | Admitting: Interventional Cardiology

## 2013-08-21 DIAGNOSIS — H16019 Central corneal ulcer, unspecified eye: Secondary | ICD-10-CM | POA: Diagnosis not present

## 2013-08-21 DIAGNOSIS — B953 Streptococcus pneumoniae as the cause of diseases classified elsewhere: Secondary | ICD-10-CM | POA: Diagnosis not present

## 2013-08-28 DIAGNOSIS — H4089 Other specified glaucoma: Secondary | ICD-10-CM | POA: Diagnosis not present

## 2013-08-28 DIAGNOSIS — H1820 Unspecified corneal edema: Secondary | ICD-10-CM | POA: Diagnosis not present

## 2013-08-28 DIAGNOSIS — H16019 Central corneal ulcer, unspecified eye: Secondary | ICD-10-CM | POA: Diagnosis not present

## 2013-08-28 DIAGNOSIS — Z947 Corneal transplant status: Secondary | ICD-10-CM | POA: Diagnosis not present

## 2013-08-30 ENCOUNTER — Ambulatory Visit (INDEPENDENT_AMBULATORY_CARE_PROVIDER_SITE_OTHER): Payer: Medicare Other | Admitting: Interventional Cardiology

## 2013-08-30 ENCOUNTER — Encounter: Payer: Self-pay | Admitting: Interventional Cardiology

## 2013-08-30 VITALS — BP 146/67 | HR 49 | Ht 70.0 in | Wt 146.0 lb

## 2013-08-30 DIAGNOSIS — C3490 Malignant neoplasm of unspecified part of unspecified bronchus or lung: Secondary | ICD-10-CM

## 2013-08-30 DIAGNOSIS — E785 Hyperlipidemia, unspecified: Secondary | ICD-10-CM

## 2013-08-30 DIAGNOSIS — I10 Essential (primary) hypertension: Secondary | ICD-10-CM

## 2013-08-30 DIAGNOSIS — I495 Sick sinus syndrome: Secondary | ICD-10-CM | POA: Diagnosis not present

## 2013-08-30 DIAGNOSIS — I4510 Unspecified right bundle-branch block: Secondary | ICD-10-CM | POA: Diagnosis not present

## 2013-08-30 DIAGNOSIS — I2510 Atherosclerotic heart disease of native coronary artery without angina pectoris: Secondary | ICD-10-CM | POA: Diagnosis not present

## 2013-08-30 LAB — LIPID PANEL
CHOL/HDL RATIO: 3
CHOLESTEROL: 174 mg/dL (ref 0–200)
HDL: 65.4 mg/dL (ref >39.00)
LDL CALC: 99 mg/dL (ref 0–99)
Triglycerides: 48 mg/dL (ref 0.0–149.0)
VLDL: 9.6 mg/dL (ref 0.0–40.0)

## 2013-08-30 LAB — ALT: ALT: 12 U/L (ref 0–53)

## 2013-08-30 NOTE — Progress Notes (Signed)
Patient ID: Richard [name], male   DOB: 24-Dec-1934, 79 y.o.   MRN: [id number]    [address], [address] [place], [place]  [address] Phone: [phone] Fax:  [phone]  Date:  08/30/2013   ID:  Richard [name], DOB 02/01/1935, MRN [id number]  PCP:  Irven Shelling, MD   ASSESSMENT:  1. coronary artery disease, stable without angina 2. Sinoatrial node dysfunction 3. Hypertension 4. Hyperlipidemia 5. Right bundle branch block  PLAN:  1. Fasting lipid panel and ALT 2. Clinical followup in one year 3. Notify us if angina   SUBJECTIVE: Richard [name] is a 79 y.o. male was done well from a cardiovascular standpoint. The major medical issue over the past 12 months is been a left eye ulcer at the site of previous lens transplant. He does have exertional fatigue. Instead of more discharge continuously for one hour as he used to be able to do, he now takes a break after about 30 minutes. He just feels tired. He has not been lightheaded, dizzy, or had syncope. He denies palpitations.   Wt Readings from Last 3 Encounters:  08/30/13 146 lb (66.225 kg)  09/06/12 147 lb (66.679 kg)  09/15/11 150 lb (68.04 kg)     Past Medical History  Diagnosis Date  . Hypertension   . Coronary artery disease   . Hypercholesteremia   . Coronary artery stenosis   . lung ca dx'd 07/2008    Current Outpatient Prescriptions  Medication Sig Dispense Refill  . amLODipine (NORVASC) 5 MG tablet Take 5 mg by mouth daily.      Marland Kitchen atorvastatin (LIPITOR) 80 MG tablet Take 1 tablet (80 mg total) by mouth daily.  90 tablet  1  . brimonidine (ALPHAGAN P) 0.1 % SOLN Place 2 drops into both eyes daily.      . clopidogrel (PLAVIX) 75 MG tablet Take 75 mg by mouth daily.        . dorzolamide-timolol (COSOPT) 22.3-6.8 MG/ML ophthalmic solution Place 1 drop into both eyes 2 (two) times daily.       . Fluorometholone (FML OP) Apply 1 drop to eye daily.        Marland Kitchen latanoprost (XALATAN) 0.005 % ophthalmic solution Place 1  drop into both eyes daily.       . nitroGLYCERIN (NITROSTAT) 0.4 MG SL tablet Place 0.4 mg under the tongue every 5 (five) minutes as needed.         No current facility-administered medications for this visit.    Allergies:    Allergies  Allergen Reactions  . Aspirin     REACTION: Hives    Social History:  The patient  reports that he quit smoking about 51 years ago. He does not have any smokeless tobacco history on file. He reports that he drinks alcohol. He reports that he does not use illicit drugs.   ROS:  Please see the history of present illness.   Appetite is been stable. He has not been sleeping well. Weight is down slightly.   All other systems reviewed and negative.   OBJECTIVE: VS:  BP 146/67  Pulse 49  Ht 5\' 10"  (1.778 m)  Wt 146 lb (66.225 kg)  BMI 20.95 kg/m2 Well nourished, well developed, in no acute distress, elderly HEENT: normal, ulcer and left thigh is healing Neck: JVD flat. Carotid bruit absent  Cardiac:  normal S1, S2; RRR; no murmur Lungs:  clear to auscultation bilaterally, no wheezing, rhonchi or rales Abd:  soft, nontender, no hepatomegaly Ext: Edema absent. Pulses difficult to palpate but present in the popliteals bilaterally. Skin: warm and dry Neuro:  CNs 2-12 intact, no focal abnormalities noted  EKG:  Marked sinus bradycardia with right bundle branch block       Signed, Illene Labrador III, MD 08/30/2013 9:53 AM

## 2013-08-30 NOTE — Patient Instructions (Signed)
Your physician recommends that you continue on your current medications as directed. Please refer to the Current Medication list given to you today.  Lab Today: Lipid, Alt  Your physician wants you to follow-up in: 1 year You will receive a reminder letter in the mail two months in advance. If you don't receive a letter, please call our office to schedule the follow-up appointment.  Your physician recommends that you return for a FASTING lipid profile, alt: in 1 year

## 2013-08-31 ENCOUNTER — Other Ambulatory Visit: Payer: Self-pay | Admitting: Interventional Cardiology

## 2013-09-04 ENCOUNTER — Other Ambulatory Visit (HOSPITAL_BASED_OUTPATIENT_CLINIC_OR_DEPARTMENT_OTHER): Payer: Medicare Other

## 2013-09-04 ENCOUNTER — Encounter (HOSPITAL_COMMUNITY): Payer: Self-pay

## 2013-09-04 ENCOUNTER — Ambulatory Visit (HOSPITAL_COMMUNITY)
Admission: RE | Admit: 2013-09-04 | Discharge: 2013-09-04 | Disposition: A | Discharge status: Home / Self Care | Payer: Medicare Other | Source: Ambulatory Visit | Attending: Internal Medicine | Admitting: Internal Medicine

## 2013-09-04 DIAGNOSIS — C3490 Malignant neoplasm of unspecified part of unspecified bronchus or lung: Secondary | ICD-10-CM | POA: Diagnosis not present

## 2013-09-04 DIAGNOSIS — C3410 Malignant neoplasm of upper lobe, unspecified bronchus or lung: Secondary | ICD-10-CM

## 2013-09-04 DIAGNOSIS — J984 Other disorders of lung: Secondary | ICD-10-CM | POA: Diagnosis not present

## 2013-09-04 DIAGNOSIS — H1820 Unspecified corneal edema: Secondary | ICD-10-CM | POA: Diagnosis not present

## 2013-09-04 DIAGNOSIS — H4011X0 Primary open-angle glaucoma, stage unspecified: Secondary | ICD-10-CM | POA: Diagnosis not present

## 2013-09-04 DIAGNOSIS — H4089 Other specified glaucoma: Secondary | ICD-10-CM | POA: Diagnosis not present

## 2013-09-04 DIAGNOSIS — H16019 Central corneal ulcer, unspecified eye: Secondary | ICD-10-CM | POA: Diagnosis not present

## 2013-09-04 LAB — COMPREHENSIVE METABOLIC PANEL (CC13)
ALBUMIN: 3.3 g/dL — AB (ref 3.5–5.0)
ALT: 7 U/L (ref 0–55)
ANION GAP: 10 meq/L (ref 3–11)
AST: 16 U/L (ref 5–34)
Alkaline Phosphatase: 110 U/L (ref 40–150)
BUN: 11.7 mg/dL (ref 7.0–26.0)
CO2: 26 meq/L (ref 22–29)
Calcium: 9.1 mg/dL (ref 8.4–10.4)
Chloride: 106 mEq/L (ref 98–109)
Creatinine: 0.8 mg/dL (ref 0.7–1.3)
GLUCOSE: 99 mg/dL (ref 70–140)
POTASSIUM: 4.4 meq/L (ref 3.5–5.1)
SODIUM: 142 meq/L (ref 136–145)
TOTAL PROTEIN: 7.2 g/dL (ref 6.4–8.3)
Total Bilirubin: 0.49 mg/dL (ref 0.20–1.20)

## 2013-09-04 LAB — CBC WITH DIFFERENTIAL/PLATELET
BASO%: 0.7 % (ref 0.0–2.0)
Basophils Absolute: 0.1 10*3/uL (ref 0.0–0.1)
EOS ABS: 0.2 10*3/uL (ref 0.0–0.5)
EOS%: 2.8 % (ref 0.0–7.0)
HCT: 42 % (ref 38.4–49.9)
HGB: 13.8 g/dL (ref 13.0–17.1)
LYMPH#: 0.7 10*3/uL — AB (ref 0.9–3.3)
LYMPH%: 9 % — ABNORMAL LOW (ref 14.0–49.0)
MCH: 30.4 pg (ref 27.2–33.4)
MCHC: 32.9 g/dL (ref 32.0–36.0)
MCV: 92.3 fL (ref 79.3–98.0)
MONO#: 1.1 10*3/uL — AB (ref 0.1–0.9)
MONO%: 14.1 % — ABNORMAL HIGH (ref 0.0–14.0)
NEUT%: 73.4 % (ref 39.0–75.0)
NEUTROS ABS: 5.8 10*3/uL (ref 1.5–6.5)
Platelets: 295 10*3/uL (ref 140–400)
RBC: 4.54 10*6/uL (ref 4.20–5.82)
RDW: 14.5 % (ref 11.0–14.6)
WBC: 7.9 10*3/uL (ref 4.0–10.3)

## 2013-09-06 ENCOUNTER — Encounter: Payer: Self-pay | Admitting: Internal Medicine

## 2013-09-06 ENCOUNTER — Ambulatory Visit (HOSPITAL_BASED_OUTPATIENT_CLINIC_OR_DEPARTMENT_OTHER): Payer: Medicare Other | Admitting: Internal Medicine

## 2013-09-06 VITALS — BP 136/61 | HR 70 | Temp 98.0°F | Resp 18 | Ht 70.0 in | Wt 144.4 lb

## 2013-09-06 DIAGNOSIS — I2510 Atherosclerotic heart disease of native coronary artery without angina pectoris: Secondary | ICD-10-CM

## 2013-09-06 DIAGNOSIS — C3490 Malignant neoplasm of unspecified part of unspecified bronchus or lung: Secondary | ICD-10-CM

## 2013-09-06 DIAGNOSIS — Z85118 Personal history of other malignant neoplasm of bronchus and lung: Secondary | ICD-10-CM

## 2013-09-06 NOTE — Progress Notes (Signed)
[facility] [phone]   Fax:([phone]  OFFICE PROGRESS NOTE  Irven Shelling, MD [place] [employer], [address] [place] [address]  PRINCIPAL DIAGNOSIS: Stage IA (T1bN0MX) non-small cell lung cancer, adenocarcinoma with bronchoalveolar features, diagnosed in March 2010.   PRIOR THERAPY: Status post left upper lobectomy on October 24, 2008, under the care of Dr Arlyce Dice.   CURRENT THERAPY: Observation.  INTERVAL HISTORY: Richard [name] 79 y.o. male returns to the clinic today for routine annual followup visit. The patient has no complaints today. He denied having any significant chest pain, shortness of breath, cough or hemoptysis. He had some cough during the wintertime but this completely resolved. He has no nausea or vomiting. He has no weight loss or night sweats. He has repeat CT scan of the chest performed recently and he is here today for evaluation and discussion of his scan results.  MEDICAL HISTORY: Past Medical History  Diagnosis Date  . Hypertension   . Coronary artery disease   . Hypercholesteremia   . Coronary artery stenosis   . lung ca dx'd 07/2008    ALLERGIES:  is allergic to aspirin.  MEDICATIONS:  Current Outpatient Prescriptions  Medication Sig Dispense Refill  . amLODipine (NORVASC) 5 MG tablet Take 5 mg by mouth daily.      Marland Kitchen atorvastatin (LIPITOR) 80 MG tablet Take 1 tablet (80 mg total) by mouth daily.  90 tablet  1  . brimonidine (ALPHAGAN P) 0.1 % SOLN Place 2 drops into the left eye daily.       . clopidogrel (PLAVIX) 75 MG tablet TAKE 1 TABLET BY MOUTH ONCE DAILY  90 tablet  1  . dorzolamide-timolol (COSOPT) 22.3-6.8 MG/ML ophthalmic solution Place 1 drop into both eyes 2 (two) times daily.       . Fluorometholone (FML OP) Place 1 drop into the right eye daily.       Marland Kitchen latanoprost (XALATAN) 0.005 % ophthalmic solution Place 1 drop into both eyes daily.       . nitroGLYCERIN (NITROSTAT) 0.4 MG SL tablet Place  0.4 mg under the tongue every 5 (five) minutes as needed.         No current facility-administered medications for this visit.    SURGICAL HISTORY:  Past Surgical History  Procedure Laterality Date  . Corneal transplant    . Bilateral inguinal hernia repair    . Left video-assisted thoracic surgery, mini-  10/24/2008    REVIEW OF SYSTEMS:  A comprehensive review of systems was negative.   PHYSICAL EXAMINATION: General appearance: alert, cooperative and no distress Head: Normocephalic, without obvious abnormality, atraumatic Neck: no adenopathy Lymph nodes: Cervical, supraclavicular, and axillary nodes normal. Resp: clear to auscultation bilaterally Cardio: regular rate and rhythm, S1, S2 normal, no murmur, click, rub or gallop GI: soft, non-tender; bowel sounds normal; no masses,  no organomegaly Extremities: extremities normal, atraumatic, no cyanosis or edema  ECOG PERFORMANCE STATUS: 1 - Symptomatic but completely ambulatory  Blood pressure 136/61, pulse 70, temperature 98 F (36.7 C), temperature source Oral, resp. rate 18, height 5\' 10"  (1.778 m), weight 144 lb 6.4 oz (65.499 kg), SpO2 100.00%.  LABORATORY DATA: Lab Results  Component Value Date   WBC 7.9 09/04/2013   HGB 13.8 09/04/2013   HCT 42.0 09/04/2013   MCV 92.3 09/04/2013   PLT 295 09/04/2013      Chemistry      Component Value Date/Time   NA 142 09/04/2013 5974  NA 141 09/02/2011 0850   NA 138 03/08/2011 1315   K 4.4 09/04/2013 0812   K 4.9* 09/02/2011 0850   K 4.2 03/08/2011 1315   CL 105 09/04/2012 0813   CL 100 09/02/2011 0850   CL 102 03/08/2011 1315   CO2 26 09/04/2013 0812   CO2 28 09/02/2011 0850   CO2 25 03/08/2011 1315   BUN 11.7 09/04/2013 0812   BUN 13 09/02/2011 0850   BUN 10 03/08/2011 1315   CREATININE 0.8 09/04/2013 0812   CREATININE 1.1 09/02/2011 0850   CREATININE 0.79 03/08/2011 1315      Component Value Date/Time   CALCIUM 9.1 09/04/2013 0812   CALCIUM 8.5 09/02/2011 0850   CALCIUM 8.9  03/08/2011 1315   ALKPHOS 110 09/04/2013 0812   ALKPHOS 89* 09/02/2011 0850   ALKPHOS 100 03/08/2011 1315   AST 16 09/04/2013 0812   AST 18 09/02/2011 0850   AST 13 03/08/2011 1315   ALT 7 09/04/2013 0812   ALT 12 08/30/2013 1010   ALT 12 09/02/2011 0850   BILITOT 0.49 09/04/2013 0812   BILITOT 0.60 09/02/2011 0850   BILITOT 0.5 03/08/2011 1315       RADIOGRAPHIC STUDIES: Ct Chest Wo Contrast  09/04/2013   CLINICAL DATA:  Lung cancer diagnosed 3/10. Left upper lobectomy. No current complaints.  EXAM: CT CHEST WITHOUT CONTRAST  TECHNIQUE: Multidetector CT imaging of the chest was performed following the standard protocol without IV contrast.  COMPARISON:  09/04/2012  FINDINGS: Lungs/Pleura: Status post left upper lobectomy. Normal airway otherwise.  No suspicious dominant pulmonary nodule or mass. Minimal progression of clustered micro nodules which are greater on the right, but scattered throughout both lungs.  No pleural fluid.  Heart/Mediastinum: No supraclavicular adenopathy. Aortic and branch vessel atherosclerosis. Normal heart size, without pericardial effusion. Multivessel coronary artery atherosclerosis. No mediastinal or definite hilar adenopathy, given limitations of unenhanced CT.  Upper Abdomen: Underdistended proximal stomach. Normal adrenal glands. Left renal sinus cysts, incompletely imaged. Abdominal aortic and branch vessel atherosclerosis.  Bones/Musculoskeletal: Diffuse idiopathic skeletal hyperostosis. No suspicious focal osseous abnormality.  IMPRESSION: 1. Status post left upper lobectomy, without locally recurrent or metastatic disease. 2. Slight progression of clustered micro nodules, most consistent with an infectious or inflammatory bronchiolitis. 3.  Atherosclerosis, including within the coronary arteries.   Electronically Signed   By: Abigail Miyamoto M.D.   On: 09/04/2013 10:22   ASSESSMENT AND PLAN: This is a very pleasant 79 years old white male with history of stage IA  non-small cell lung cancer status post left upper lobectomy and has been observation since July 2010 with no evidence for disease recurrence.  I discussed the scan results with the patient. I discussed with the patient several options for his condition including continuous observation and repeat annual scan at the cancer center versus regular followup visit with his primary care physician and repeat chest x-ray at least annually. He would like to followup with his primary care physician. I would see him an as-needed basis.   He was advised to call me immediately if he has any concerning symptoms in the interval.  All questions were answered. The patient knows to call the clinic with any problems, questions or concerns. We can certainly see the patient much sooner if necessary.  Disclaimer: This note was dictated with voice recognition software. Similar sounding words can inadvertently be transcribed and may not be corrected upon review.

## 2013-09-11 DIAGNOSIS — H4089 Other specified glaucoma: Secondary | ICD-10-CM | POA: Diagnosis not present

## 2013-09-11 DIAGNOSIS — H4011X0 Primary open-angle glaucoma, stage unspecified: Secondary | ICD-10-CM | POA: Diagnosis not present

## 2013-09-11 DIAGNOSIS — H1820 Unspecified corneal edema: Secondary | ICD-10-CM | POA: Diagnosis not present

## 2013-09-11 DIAGNOSIS — H16019 Central corneal ulcer, unspecified eye: Secondary | ICD-10-CM | POA: Diagnosis not present

## 2013-09-25 DIAGNOSIS — H4089 Other specified glaucoma: Secondary | ICD-10-CM | POA: Diagnosis not present

## 2013-09-25 DIAGNOSIS — H4011X0 Primary open-angle glaucoma, stage unspecified: Secondary | ICD-10-CM | POA: Diagnosis not present

## 2013-09-25 DIAGNOSIS — H179 Unspecified corneal scar and opacity: Secondary | ICD-10-CM | POA: Diagnosis not present

## 2013-09-25 DIAGNOSIS — H1820 Unspecified corneal edema: Secondary | ICD-10-CM | POA: Diagnosis not present

## 2013-11-13 DIAGNOSIS — H16009 Unspecified corneal ulcer, unspecified eye: Secondary | ICD-10-CM | POA: Diagnosis not present

## 2013-11-13 DIAGNOSIS — Z947 Corneal transplant status: Secondary | ICD-10-CM | POA: Diagnosis not present

## 2013-11-13 DIAGNOSIS — H4011X0 Primary open-angle glaucoma, stage unspecified: Secondary | ICD-10-CM | POA: Diagnosis not present

## 2013-11-13 DIAGNOSIS — H18519 Endothelial corneal dystrophy, unspecified eye: Secondary | ICD-10-CM | POA: Diagnosis not present

## 2013-12-18 DIAGNOSIS — H4089 Other specified glaucoma: Secondary | ICD-10-CM | POA: Diagnosis not present

## 2013-12-18 DIAGNOSIS — H1820 Unspecified corneal edema: Secondary | ICD-10-CM | POA: Diagnosis not present

## 2013-12-18 DIAGNOSIS — H4011X0 Primary open-angle glaucoma, stage unspecified: Secondary | ICD-10-CM | POA: Diagnosis not present

## 2013-12-18 DIAGNOSIS — H179 Unspecified corneal scar and opacity: Secondary | ICD-10-CM | POA: Diagnosis not present

## 2013-12-25 DIAGNOSIS — H179 Unspecified corneal scar and opacity: Secondary | ICD-10-CM | POA: Diagnosis not present

## 2013-12-28 ENCOUNTER — Other Ambulatory Visit: Payer: Self-pay | Admitting: Interventional Cardiology

## 2014-02-18 DIAGNOSIS — Z01818 Encounter for other preprocedural examination: Secondary | ICD-10-CM | POA: Diagnosis not present

## 2014-02-21 DIAGNOSIS — Y832 Surgical operation with anastomosis, bypass or graft as the cause of abnormal reaction of the patient, or of later complication, without mention of misadventure at the time of the procedure: Secondary | ICD-10-CM | POA: Diagnosis not present

## 2014-02-21 DIAGNOSIS — T86841 Corneal transplant failure: Secondary | ICD-10-CM | POA: Diagnosis not present

## 2014-02-21 DIAGNOSIS — T85398A Other mechanical complication of other ocular prosthetic devices, implants and grafts, initial encounter: Secondary | ICD-10-CM | POA: Diagnosis not present

## 2014-02-21 DIAGNOSIS — H1789 Other corneal scars and opacities: Secondary | ICD-10-CM | POA: Diagnosis not present

## 2014-02-21 DIAGNOSIS — Z9889 Other specified postprocedural states: Secondary | ICD-10-CM | POA: Diagnosis not present

## 2014-03-06 ENCOUNTER — Other Ambulatory Visit: Payer: Self-pay | Admitting: Interventional Cardiology

## 2014-04-09 DIAGNOSIS — Z23 Encounter for immunization: Secondary | ICD-10-CM | POA: Diagnosis not present

## 2014-04-09 DIAGNOSIS — Z1389 Encounter for screening for other disorder: Secondary | ICD-10-CM | POA: Diagnosis not present

## 2014-04-09 DIAGNOSIS — E780 Pure hypercholesterolemia: Secondary | ICD-10-CM | POA: Diagnosis not present

## 2014-04-09 DIAGNOSIS — I2510 Atherosclerotic heart disease of native coronary artery without angina pectoris: Secondary | ICD-10-CM | POA: Diagnosis not present

## 2014-04-09 DIAGNOSIS — I10 Essential (primary) hypertension: Secondary | ICD-10-CM | POA: Diagnosis not present

## 2014-04-09 DIAGNOSIS — Z0000 Encounter for general adult medical examination without abnormal findings: Secondary | ICD-10-CM | POA: Diagnosis not present

## 2014-04-09 DIAGNOSIS — C61 Malignant neoplasm of prostate: Secondary | ICD-10-CM | POA: Diagnosis not present

## 2014-04-09 DIAGNOSIS — H409 Unspecified glaucoma: Secondary | ICD-10-CM | POA: Diagnosis not present

## 2014-04-09 DIAGNOSIS — C3490 Malignant neoplasm of unspecified part of unspecified bronchus or lung: Secondary | ICD-10-CM | POA: Diagnosis not present

## 2014-04-17 DIAGNOSIS — H6123 Impacted cerumen, bilateral: Secondary | ICD-10-CM | POA: Diagnosis not present

## 2014-04-19 DIAGNOSIS — Z947 Corneal transplant status: Secondary | ICD-10-CM | POA: Diagnosis not present

## 2014-05-17 DIAGNOSIS — H4033X3 Glaucoma secondary to eye trauma, bilateral, severe stage: Secondary | ICD-10-CM | POA: Diagnosis not present

## 2014-05-17 DIAGNOSIS — H40052 Ocular hypertension, left eye: Secondary | ICD-10-CM | POA: Diagnosis not present

## 2014-05-17 DIAGNOSIS — Z947 Corneal transplant status: Secondary | ICD-10-CM | POA: Diagnosis not present

## 2014-05-23 DIAGNOSIS — H40052 Ocular hypertension, left eye: Secondary | ICD-10-CM | POA: Diagnosis not present

## 2014-05-23 DIAGNOSIS — Z947 Corneal transplant status: Secondary | ICD-10-CM | POA: Diagnosis not present

## 2014-05-23 DIAGNOSIS — H4033X3 Glaucoma secondary to eye trauma, bilateral, severe stage: Secondary | ICD-10-CM | POA: Diagnosis not present

## 2014-06-09 IMAGING — CT CT CHEST W/O CM
2 of 4 series · 15 of 36 positions shown, 18 images · non-contrast
Comparison: 09/04/2012

CLINICAL DATA: Lung cancer diagnosed [DATE]. Left upper lobectomy. No
current complaints.

EXAM:
CT CHEST WITHOUT CONTRAST
TECHNIQUE: Multidetector CT imaging of the chest was performed following the
standard protocol without IV contrast.

[Series 2: chest w/o st · axial · non-contrast · 0.78mm/px · z∈[-300,-14]mm · 12 of 69 slices shown, 15 images]
[im 6/69  mediastinal]
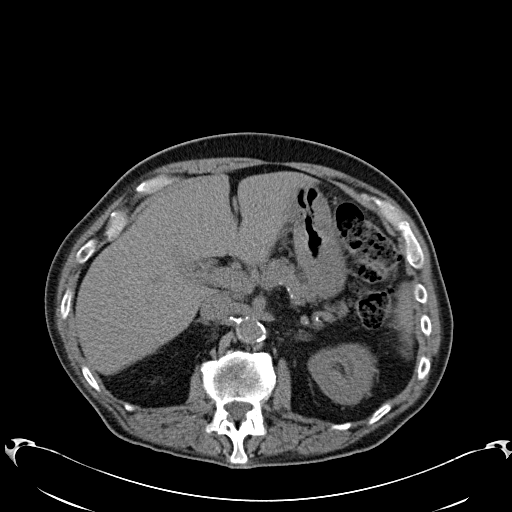
[im 6/69  lung]
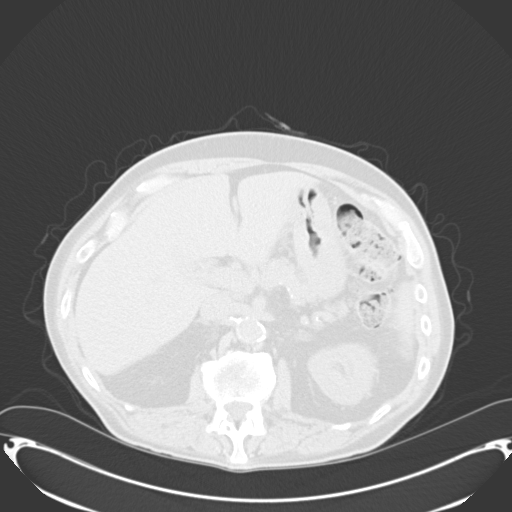
[im 11/69  lung]
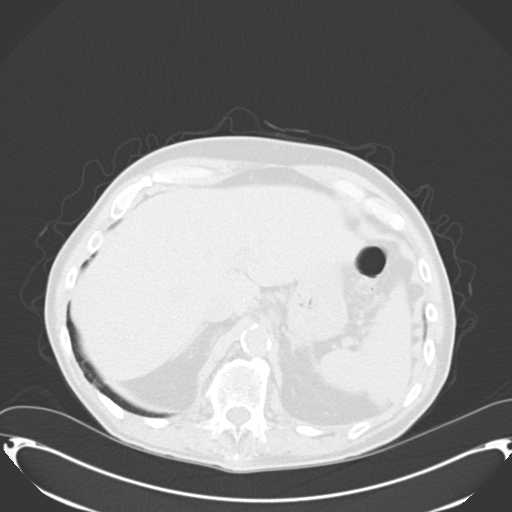
[im 16/69  lung]
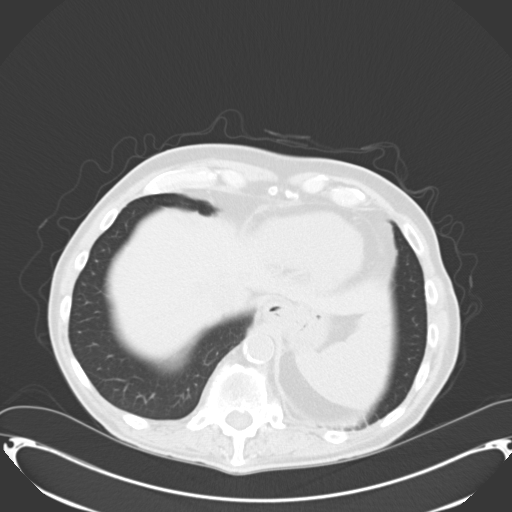
[im 21/69  lung]
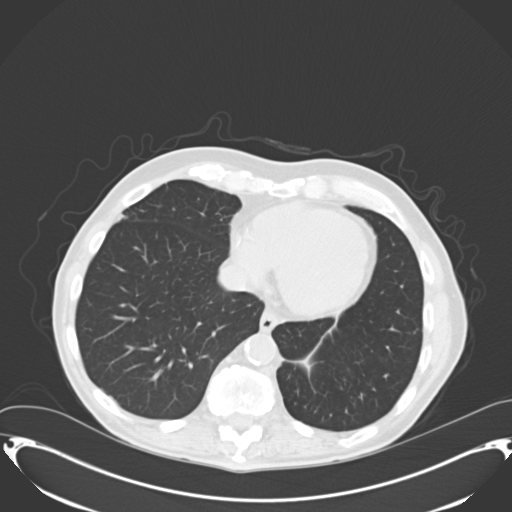
[im 27/69  mediastinal]
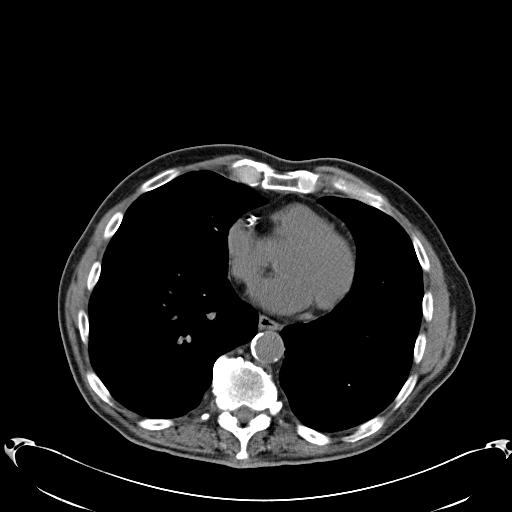
[im 27/69  lung]
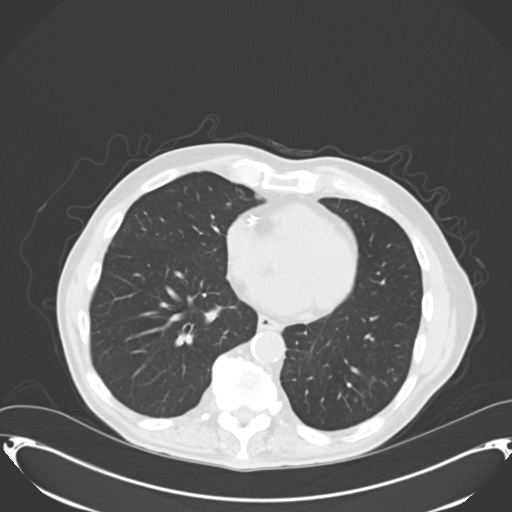
[im 32/69  lung]
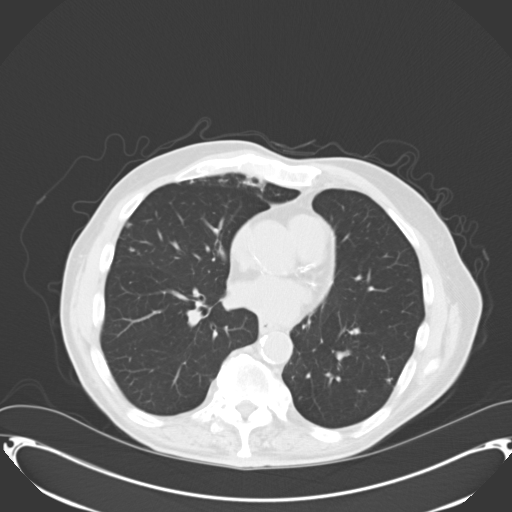
[im 37/69  lung]
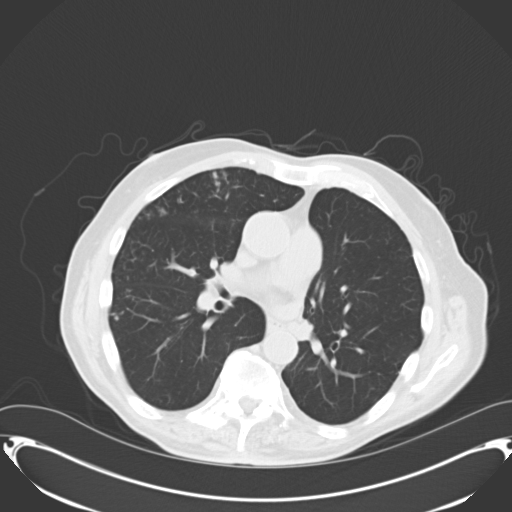
[im 42/69  lung]
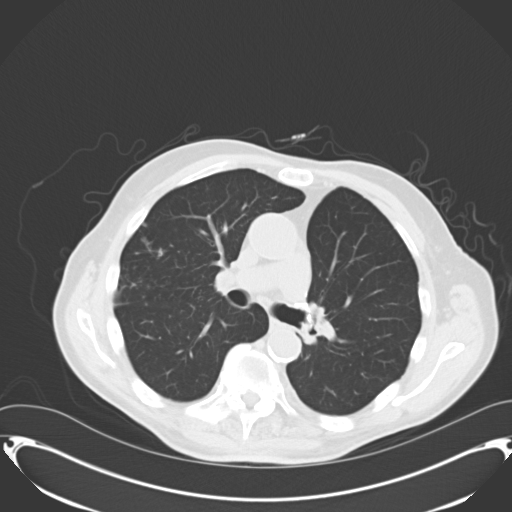
[im 48/69  mediastinal]
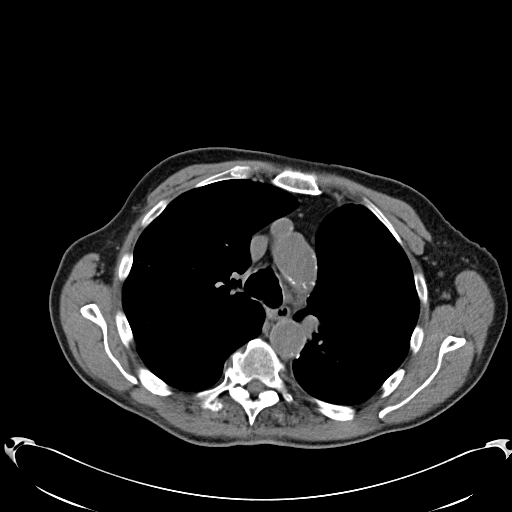
[im 48/69  lung]
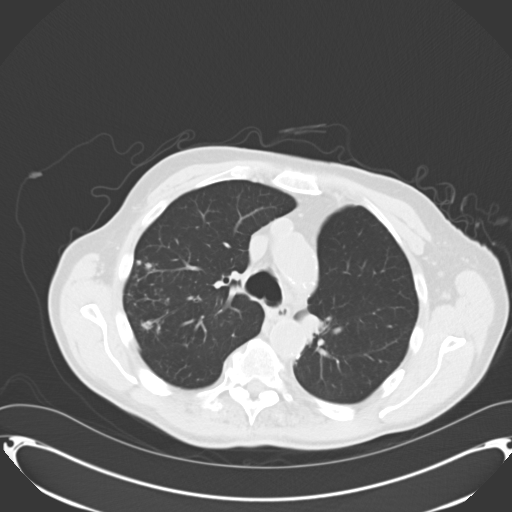
[im 53/69  lung]
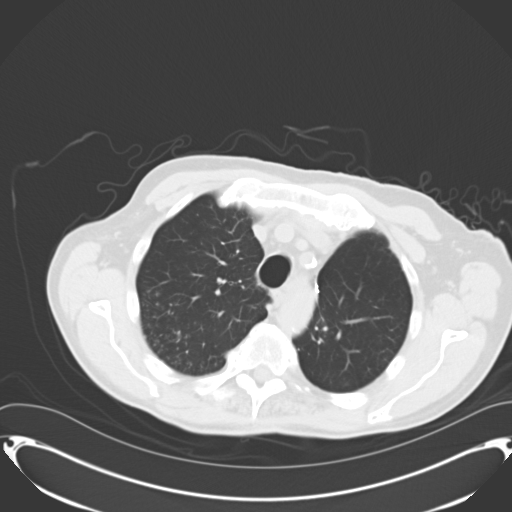
[im 58/69  lung]
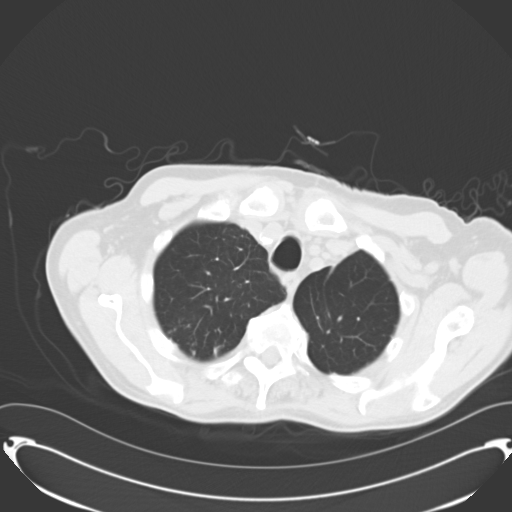
[im 63/69  lung]
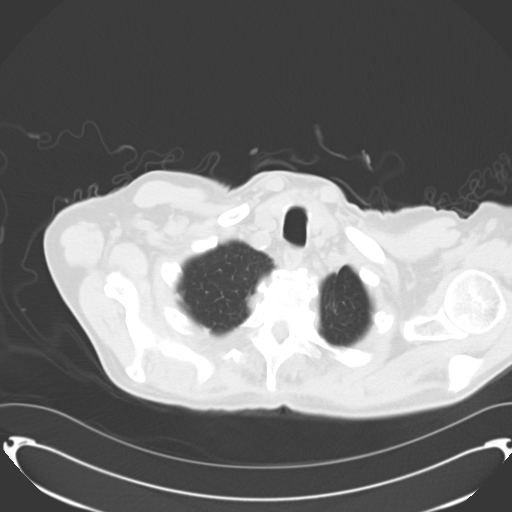

[Series 602: <mpr thick range> · coronal · 0.78mm/px · 3 of 89 slices shown]
[im 18/89  lung]
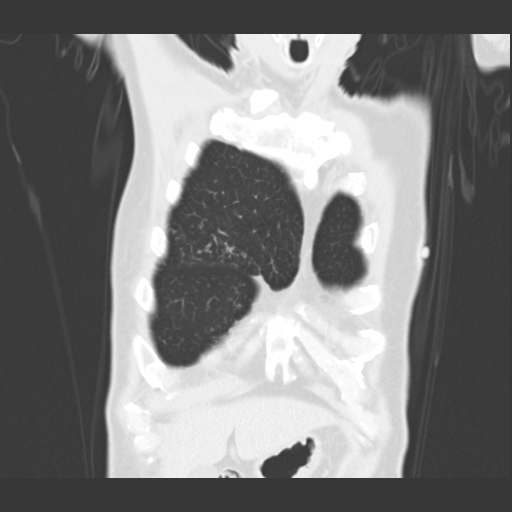
[im 36/89  lung]
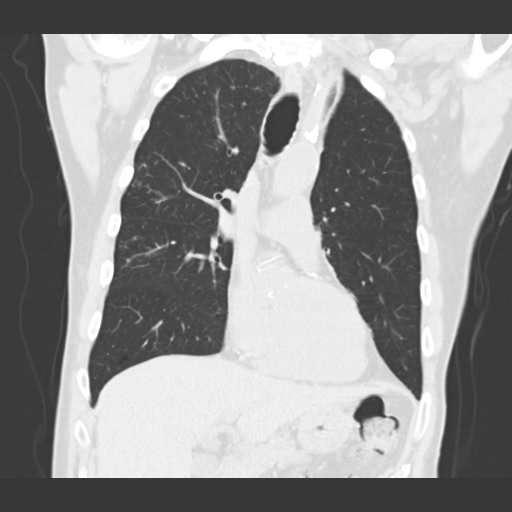
[im 53/89  lung]
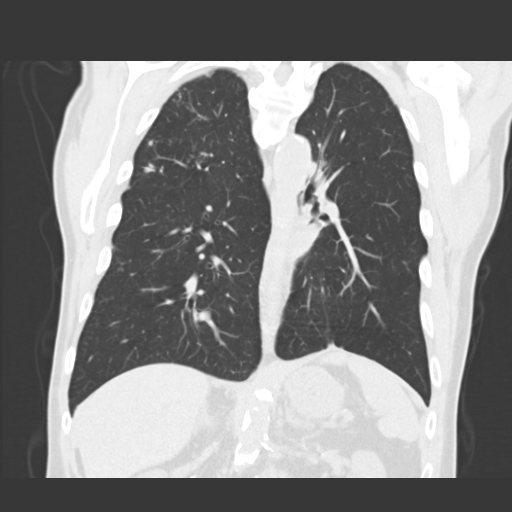

[15 of 36 positions shown; findings below may reference images not displayed]

FINDINGS: Lungs/Pleura: Status post left upper lobectomy. Normal airway
otherwise.

No suspicious dominant pulmonary nodule or mass. Minimal progression
of clustered micro nodules which are greater on the right, but
scattered throughout both lungs.

No pleural fluid.

Heart/Mediastinum: No supraclavicular adenopathy. Aortic and branch
vessel atherosclerosis. Normal heart size, without pericardial
effusion. Multivessel coronary artery atherosclerosis. No
mediastinal or definite hilar adenopathy, given limitations of
unenhanced CT.

Upper Abdomen: Underdistended proximal stomach. Normal adrenal
glands. Left renal sinus cysts, incompletely imaged. Abdominal
aortic and branch vessel atherosclerosis.

Bones/Musculoskeletal: Diffuse idiopathic skeletal hyperostosis. No
suspicious focal osseous abnormality.
IMPRESSION: 1. Status post left upper lobectomy, without locally recurrent or
metastatic disease.
2. Slight progression of clustered micro nodules, most consistent
with an infectious or inflammatory bronchiolitis.
3.  Atherosclerosis, including within the coronary arteries.

## 2014-06-12 DIAGNOSIS — H40052 Ocular hypertension, left eye: Secondary | ICD-10-CM | POA: Diagnosis not present

## 2014-06-12 DIAGNOSIS — H4033X3 Glaucoma secondary to eye trauma, bilateral, severe stage: Secondary | ICD-10-CM | POA: Diagnosis not present

## 2014-06-12 DIAGNOSIS — Z947 Corneal transplant status: Secondary | ICD-10-CM | POA: Diagnosis not present

## 2014-06-26 DIAGNOSIS — Z947 Corneal transplant status: Secondary | ICD-10-CM | POA: Diagnosis not present

## 2014-06-26 DIAGNOSIS — H40052 Ocular hypertension, left eye: Secondary | ICD-10-CM | POA: Diagnosis not present

## 2014-06-26 DIAGNOSIS — H4033X3 Glaucoma secondary to eye trauma, bilateral, severe stage: Secondary | ICD-10-CM | POA: Diagnosis not present

## 2014-06-27 ENCOUNTER — Other Ambulatory Visit: Payer: Self-pay | Admitting: Interventional Cardiology

## 2014-07-11 DIAGNOSIS — H4011X2 Primary open-angle glaucoma, moderate stage: Secondary | ICD-10-CM | POA: Diagnosis not present

## 2014-07-11 DIAGNOSIS — Z947 Corneal transplant status: Secondary | ICD-10-CM | POA: Diagnosis not present

## 2014-07-11 DIAGNOSIS — H4089 Other specified glaucoma: Secondary | ICD-10-CM | POA: Diagnosis not present

## 2014-07-16 DIAGNOSIS — H1851 Endothelial corneal dystrophy: Secondary | ICD-10-CM | POA: Diagnosis not present

## 2014-08-23 ENCOUNTER — Other Ambulatory Visit: Payer: Self-pay | Admitting: Interventional Cardiology

## 2014-09-23 ENCOUNTER — Other Ambulatory Visit: Payer: Self-pay | Admitting: Interventional Cardiology

## 2014-09-24 ENCOUNTER — Ambulatory Visit (INDEPENDENT_AMBULATORY_CARE_PROVIDER_SITE_OTHER): Payer: Medicare Other | Admitting: Interventional Cardiology

## 2014-09-24 ENCOUNTER — Encounter: Payer: Self-pay | Admitting: Interventional Cardiology

## 2014-09-24 VITALS — BP 154/72 | HR 52 | Ht 70.0 in | Wt 148.8 lb

## 2014-09-24 DIAGNOSIS — I2510 Atherosclerotic heart disease of native coronary artery without angina pectoris: Secondary | ICD-10-CM

## 2014-09-24 DIAGNOSIS — I4510 Unspecified right bundle-branch block: Secondary | ICD-10-CM | POA: Diagnosis not present

## 2014-09-24 DIAGNOSIS — I10 Essential (primary) hypertension: Secondary | ICD-10-CM

## 2014-09-24 DIAGNOSIS — E785 Hyperlipidemia, unspecified: Secondary | ICD-10-CM

## 2014-09-24 DIAGNOSIS — I495 Sick sinus syndrome: Secondary | ICD-10-CM | POA: Diagnosis not present

## 2014-09-24 MED ORDER — AMLODIPINE BESYLATE 5 MG PO TABS: 7.5000 mg | ORAL_TABLET | Freq: Every day | ORAL | Status: DC

## 2014-09-24 NOTE — Progress Notes (Signed)
Cardiology Office Note   Date:  09/24/2014   ID:  Richard [name], DOB 11/14/1934, MRN [id number]  PCP:  Irven Shelling, MD  Cardiologist:  Sinclair Grooms, MD   Chief Complaint  Patient presents with  . CORONARY ARTERY DISEASE, NATIVE      History of Present Illness: Richard [name] is a 80 y.o. male who presents for Coronary artery disease, essential hypertension, hyperlipidemia, and carotid artery disease.  He voices no cardiac symptoms. He is not quite as active as he used to be. He still hikes when they're in [place]. He still skis. He has not had chest discomfort or angina. He denies palpitations and syncope. There is no peripheral edema.    Past Medical History  Diagnosis Date  . Hypertension   . Coronary artery disease   . Hypercholesteremia   . Coronary artery stenosis   . lung ca dx'd 07/2008    Past Surgical History  Procedure Laterality Date  . Corneal transplant    . Bilateral inguinal hernia repair    . Left video-assisted thoracic surgery, mini-  10/24/2008     Current Outpatient Prescriptions  Medication Sig Dispense Refill  . amLODipine (NORVASC) 5 MG tablet Take 1.5 tablets (7.5 mg total) by mouth daily. 135 tablet 3  . atorvastatin (LIPITOR) 80 MG tablet TAKE 1 TABLET EVERY DAY 90 tablet 0  . brimonidine (ALPHAGAN P) 0.1 % SOLN Place 2 drops into the left eye daily.     . clopidogrel (PLAVIX) 75 MG tablet TAKE 1 TABLET BY MOUTH ONCE DAILY 90 tablet 0  . dorzolamide-timolol (COSOPT) 22.3-6.8 MG/ML ophthalmic solution Place 1 drop into both eyes 2 (two) times daily.     . Fluorometholone (FML OP) Place 1 drop into both eyes daily.     Marland Kitchen latanoprost (XALATAN) 0.005 % ophthalmic solution Place 1 drop into both eyes at bedtime.     . nitroGLYCERIN (NITROSTAT) 0.4 MG SL tablet Place 0.4 mg under the tongue every 5 (five) minutes as needed.       No current facility-administered medications for this visit.    Allergies:   Aspirin    Social  History:  The patient  reports that he quit smoking about 52 years ago. He does not have any smokeless tobacco history on file. He reports that he drinks alcohol. He reports that he does not use illicit drugs.   Family History:  The patient's family history includes Healthy in his brother, mother, and sister; Heart failure in his father; Rheumatic fever in his father.    ROS:  Please see the history of present illness.   Otherwise, review of systems are positive for denies palpitations and syncope..   All other systems are reviewed and negative.    PHYSICAL EXAM: VS:  BP 154/72 mmHg  Pulse 52  Ht '5\' 10"'$  (1.778 m)  Wt 148 lb 12.8 oz (67.495 kg)  BMI 21.35 kg/m2 , BMI Body mass index is 21.35 kg/(m^2). GEN: Well nourished, well developed, in no acute distress HEENT: normal Neck: no JVD, carotid bruits, or masses Cardiac: RRR; no murmurs, rubs, or gallops,no edema  Respiratory:  clear to auscultation bilaterally, normal work of breathing GI: soft, nontender, nondistended, + BS MS: no deformity or atrophy Skin: warm and dry, no rash Neuro:  Strength and sensation are intact Psych: euthymic mood, full affect   EKG:  EKG is ordered today. The ekg ordered today demonstrates sinus bradycardia, left atraumatic, right bundle branch block. No  change from prior tracing   Recent Labs: No results found for requested labs within last 365 days.    Lipid Panel    Component Value Date/Time   CHOL 174 08/30/2013 1010   TRIG 48.0 08/30/2013 1010   HDL 65.40 08/30/2013 1010   CHOLHDL 3 08/30/2013 1010   VLDL 9.6 08/30/2013 1010   LDLCALC 99 08/30/2013 1010      Wt Readings from Last 3 Encounters:  09/24/14 148 lb 12.8 oz (67.495 kg)  09/06/13 144 lb 6.4 oz (65.499 kg)  08/30/13 146 lb (66.225 kg)      Other studies Reviewed: Additional studies/ records that were reviewed today include: None. Review of the above records demonstrates: None   ASSESSMENT AND PLAN:  Sinoatrial node  dysfunction: No symptoms  Right bundle branch block: unchanged  Essential hypertension: Mildly elevated pressure. Increase amlodipine to 7.5 mg daily  Coronary artery disease involving native coronary artery of native heart without angina pectoris: Stable  Hyperlipidemia: No recent data    Current medicines are reviewed at length with the patient today.  The patient does not have concerns regarding medicines.  The following changes have been made:  Increase amlodipine to 7.5 mg per day  Labs/ tests ordered today include:   Orders Placed This Encounter  Procedures  . Lipid panel  . ALT  . EKG 12-Lead     Disposition:   FU with HS in 1 year  Signed, Sinclair Grooms, MD  09/24/2014 6:18 PM    [facility] [address], [place], [place]  [address] Phone: [phone]; Fax: [phone]

## 2014-09-24 NOTE — Patient Instructions (Signed)
Medication Instructions:  Your physician has recommended you make the following change in your medication:  INCREASE Amlodipine to 7.'5mg'$  daily   Labwork: Your physician recommends that you return for a FASTING lipid profile and alt on 09/25/14 between 7:30am-5:15pm   Testing/Procedures: None   Follow-Up: Your physician wants you to follow-up in: 1 year  You will receive a reminder letter in the mail two months in advance. If you don't receive a letter, please call our office to schedule the follow-up appointment.   Any Other Special Instructions Will Be Listed Below (If Applicable).

## 2014-09-25 ENCOUNTER — Other Ambulatory Visit (INDEPENDENT_AMBULATORY_CARE_PROVIDER_SITE_OTHER): Payer: Medicare Other | Admitting: *Deleted

## 2014-09-25 DIAGNOSIS — E785 Hyperlipidemia, unspecified: Secondary | ICD-10-CM | POA: Diagnosis not present

## 2014-09-25 LAB — LIPID PANEL
CHOL/HDL RATIO: 3
Cholesterol: 171 mg/dL (ref 0–200)
HDL: 64.6 mg/dL (ref >39.00)
LDL CALC: 96 mg/dL (ref 0–99)
NonHDL: 106.4
TRIGLYCERIDES: 54 mg/dL (ref 0.0–149.0)
VLDL: 10.8 mg/dL (ref 0.0–40.0)

## 2014-09-25 LAB — ALT: ALT: 8 U/L (ref 0–53)

## 2014-09-27 ENCOUNTER — Other Ambulatory Visit: Payer: Self-pay | Admitting: Internal Medicine

## 2014-09-27 ENCOUNTER — Telehealth: Payer: Self-pay | Admitting: Interventional Cardiology

## 2014-09-27 ENCOUNTER — Ambulatory Visit
Admission: RE | Admit: 2014-09-27 | Discharge: 2014-09-27 | Disposition: A | Discharge status: Home / Self Care | Payer: Medicare Other | Source: Ambulatory Visit | Attending: Internal Medicine | Admitting: Internal Medicine

## 2014-09-27 DIAGNOSIS — Z85118 Personal history of other malignant neoplasm of bronchus and lung: Secondary | ICD-10-CM

## 2014-09-27 DIAGNOSIS — C3490 Malignant neoplasm of unspecified part of unspecified bronchus or lung: Secondary | ICD-10-CM | POA: Diagnosis not present

## 2014-09-27 NOTE — Telephone Encounter (Signed)
Follow Up    Pt is calling regarding blood work results. Please call.

## 2014-09-27 NOTE — Telephone Encounter (Signed)
Pt aware of lab results At target and stable. Repeat in one year Pt verbalized understanding.

## 2014-10-16 DIAGNOSIS — Z947 Corneal transplant status: Secondary | ICD-10-CM | POA: Diagnosis not present

## 2014-10-16 DIAGNOSIS — H4011X2 Primary open-angle glaucoma, moderate stage: Secondary | ICD-10-CM | POA: Diagnosis not present

## 2014-10-16 DIAGNOSIS — H1851 Endothelial corneal dystrophy: Secondary | ICD-10-CM | POA: Diagnosis not present

## 2014-11-25 DIAGNOSIS — H4011X2 Primary open-angle glaucoma, moderate stage: Secondary | ICD-10-CM | POA: Diagnosis not present

## 2014-11-25 DIAGNOSIS — H4089 Other specified glaucoma: Secondary | ICD-10-CM | POA: Diagnosis not present

## 2014-11-25 DIAGNOSIS — Z947 Corneal transplant status: Secondary | ICD-10-CM | POA: Diagnosis not present

## 2014-11-28 ENCOUNTER — Other Ambulatory Visit: Payer: Self-pay | Admitting: Interventional Cardiology

## 2014-12-18 ENCOUNTER — Other Ambulatory Visit: Payer: Self-pay | Admitting: Interventional Cardiology

## 2015-02-17 DIAGNOSIS — H1851 Endothelial corneal dystrophy: Secondary | ICD-10-CM | POA: Diagnosis not present

## 2015-02-17 DIAGNOSIS — Z947 Corneal transplant status: Secondary | ICD-10-CM | POA: Diagnosis not present

## 2015-03-13 DIAGNOSIS — I10 Essential (primary) hypertension: Secondary | ICD-10-CM | POA: Diagnosis not present

## 2015-03-13 DIAGNOSIS — R636 Underweight: Secondary | ICD-10-CM | POA: Diagnosis not present

## 2015-03-13 DIAGNOSIS — C61 Malignant neoplasm of prostate: Secondary | ICD-10-CM | POA: Diagnosis not present

## 2015-03-13 DIAGNOSIS — E7800 Pure hypercholesterolemia, unspecified: Secondary | ICD-10-CM | POA: Diagnosis not present

## 2015-03-13 DIAGNOSIS — Z1389 Encounter for screening for other disorder: Secondary | ICD-10-CM | POA: Diagnosis not present

## 2015-03-13 DIAGNOSIS — C3490 Malignant neoplasm of unspecified part of unspecified bronchus or lung: Secondary | ICD-10-CM | POA: Diagnosis not present

## 2015-03-14 ENCOUNTER — Other Ambulatory Visit: Payer: Self-pay | Admitting: Internal Medicine

## 2015-03-14 ENCOUNTER — Ambulatory Visit
Admission: RE | Admit: 2015-03-14 | Discharge: 2015-03-14 | Disposition: A | Discharge status: Home / Self Care | Payer: Medicare Other | Source: Ambulatory Visit | Attending: Internal Medicine | Admitting: Internal Medicine

## 2015-03-14 DIAGNOSIS — C3490 Malignant neoplasm of unspecified part of unspecified bronchus or lung: Secondary | ICD-10-CM

## 2015-03-14 DIAGNOSIS — C61 Malignant neoplasm of prostate: Secondary | ICD-10-CM | POA: Diagnosis not present

## 2015-04-14 DIAGNOSIS — H4089 Other specified glaucoma: Secondary | ICD-10-CM | POA: Diagnosis not present

## 2015-04-14 DIAGNOSIS — H401112 Primary open-angle glaucoma, right eye, moderate stage: Secondary | ICD-10-CM | POA: Diagnosis not present

## 2015-04-14 DIAGNOSIS — Z947 Corneal transplant status: Secondary | ICD-10-CM | POA: Diagnosis not present

## 2015-07-23 DIAGNOSIS — H1851 Endothelial corneal dystrophy: Secondary | ICD-10-CM | POA: Diagnosis not present

## 2015-07-23 DIAGNOSIS — H401112 Primary open-angle glaucoma, right eye, moderate stage: Secondary | ICD-10-CM | POA: Diagnosis not present

## 2015-09-03 DIAGNOSIS — H1851 Endothelial corneal dystrophy: Secondary | ICD-10-CM | POA: Diagnosis not present

## 2015-09-17 ENCOUNTER — Other Ambulatory Visit: Payer: Self-pay | Admitting: Interventional Cardiology

## 2015-09-26 ENCOUNTER — Other Ambulatory Visit: Payer: Self-pay | Admitting: Interventional Cardiology

## 2015-11-24 ENCOUNTER — Other Ambulatory Visit: Payer: Self-pay | Admitting: Interventional Cardiology

## 2015-12-19 ENCOUNTER — Other Ambulatory Visit: Payer: Self-pay | Admitting: Interventional Cardiology

## 2015-12-25 DIAGNOSIS — H4089 Other specified glaucoma: Secondary | ICD-10-CM | POA: Diagnosis not present

## 2015-12-25 DIAGNOSIS — H401112 Primary open-angle glaucoma, right eye, moderate stage: Secondary | ICD-10-CM | POA: Diagnosis not present

## 2015-12-25 DIAGNOSIS — Z947 Corneal transplant status: Secondary | ICD-10-CM | POA: Diagnosis not present

## 2015-12-30 ENCOUNTER — Ambulatory Visit (INDEPENDENT_AMBULATORY_CARE_PROVIDER_SITE_OTHER): Payer: Medicare Other | Admitting: Interventional Cardiology

## 2015-12-30 ENCOUNTER — Other Ambulatory Visit: Payer: Self-pay | Admitting: Interventional Cardiology

## 2015-12-30 ENCOUNTER — Other Ambulatory Visit: Payer: Medicare Other

## 2015-12-30 ENCOUNTER — Encounter (INDEPENDENT_AMBULATORY_CARE_PROVIDER_SITE_OTHER): Payer: Self-pay

## 2015-12-30 ENCOUNTER — Encounter: Payer: Self-pay | Admitting: Interventional Cardiology

## 2015-12-30 VITALS — BP 134/62 | HR 69 | Ht 69.0 in | Wt 139.2 lb

## 2015-12-30 DIAGNOSIS — I10 Essential (primary) hypertension: Secondary | ICD-10-CM | POA: Diagnosis not present

## 2015-12-30 DIAGNOSIS — I4510 Unspecified right bundle-branch block: Secondary | ICD-10-CM

## 2015-12-30 DIAGNOSIS — I2510 Atherosclerotic heart disease of native coronary artery without angina pectoris: Secondary | ICD-10-CM

## 2015-12-30 DIAGNOSIS — R5383 Other fatigue: Secondary | ICD-10-CM | POA: Diagnosis not present

## 2015-12-30 DIAGNOSIS — R634 Abnormal weight loss: Secondary | ICD-10-CM

## 2015-12-30 DIAGNOSIS — I495 Sick sinus syndrome: Secondary | ICD-10-CM

## 2015-12-30 DIAGNOSIS — E785 Hyperlipidemia, unspecified: Secondary | ICD-10-CM | POA: Diagnosis not present

## 2015-12-30 LAB — HEPATIC FUNCTION PANEL
ALK PHOS: 97 U/L (ref 40–115)
ALT: 7 U/L — AB (ref 9–46)
AST: 12 U/L (ref 10–35)
Albumin: 3.3 g/dL — ABNORMAL LOW (ref 3.6–5.1)
BILIRUBIN INDIRECT: 0.4 mg/dL (ref 0.2–1.2)
Bilirubin, Direct: 0.1 mg/dL (ref <=0.2)
TOTAL PROTEIN: 6.4 g/dL (ref 6.1–8.1)
Total Bilirubin: 0.5 mg/dL (ref 0.2–1.2)

## 2015-12-30 LAB — LIPID PANEL
CHOLESTEROL: 161 mg/dL (ref 125–200)
HDL: 72 mg/dL (ref >=40)
LDL Cholesterol: 80 mg/dL (ref <130)
TRIGLYCERIDES: 44 mg/dL (ref <150)
Total CHOL/HDL Ratio: 2.2 Ratio (ref <=5.0)
VLDL: 9 mg/dL (ref <30)

## 2015-12-30 MED ORDER — AMLODIPINE BESYLATE 2.5 MG PO TABS: 2.5000 mg | ORAL_TABLET | Freq: Every day | ORAL | 3 refills | Status: DC

## 2015-12-30 NOTE — Progress Notes (Signed)
Cardiology Office Note    Date:  12/30/2015   ID:  Richard [name], DOB [birth year], MRN [id number]  PCP:  Irven Shelling, MD  Cardiologist: Sinclair Grooms, MD   Chief Complaint  Patient presents with  . Follow-up    History of Present Illness:  Richard [name] is a 81 y.o. male follow-up of coronary artery disease, sinus node dysfunction, right bundle branch block, hypertension, and fatigue.   No specific cardiopulmonary complaints on today's visit. He denies chest pain and dyspnea. His major issue has to do with poor appetite, weight loss, and decreased energy. Also not sleeping well. Is no peripheral edema. He denies orthopnea PND. He has not had syncope.    Past Medical History:  Diagnosis Date  . Coronary artery disease   . Coronary artery stenosis   . Hypercholesteremia   . Hypertension   . lung ca dx'd 07/2008    Past Surgical History:  Procedure Laterality Date  . bilateral inguinal hernia repair    . CORNEAL TRANSPLANT    . Left video-assisted thoracic surgery, mini-  10/24/2008    Current Medications: Outpatient Medications Prior to Visit  Medication Sig Dispense Refill  . brimonidine (ALPHAGAN P) 0.1 % SOLN Place 2 drops into the left eye daily.     . clopidogrel (PLAVIX) 75 MG tablet TAKE 1 TABLET BY MOUTH EVERY DAY 90 tablet 0  . dorzolamide-timolol (COSOPT) 22.3-6.8 MG/ML ophthalmic solution Place 1 drop into both eyes 2 (two) times daily.     . Fluorometholone (FML OP) Place 1 drop into both eyes daily.     Marland Kitchen latanoprost (XALATAN) 0.005 % ophthalmic solution Place 1 drop into both eyes at bedtime.     . nitroGLYCERIN (NITROSTAT) 0.4 MG SL tablet Place 0.4 mg under the tongue every 5 (five) minutes as needed.      Marland Kitchen amLODipine (NORVASC) 5 MG tablet TAKE 1 1/2 TABLET BY MOUTH EVERY DAY. 135 tablet 0  . atorvastatin (LIPITOR) 80 MG tablet Take 1 tablet (80 mg total) by mouth daily. 90 tablet 0   No facility-administered medications prior to visit.       Allergies:   Aspirin   Social History   Social History  . Marital status: Married    Spouse name: N/A  . Number of children: N/A  . Years of education: N/A   Social History Main Topics  . Smoking status: Former Smoker    Quit date: 05/10/1962  . Smokeless tobacco: Never Used  . Alcohol use Yes  . Drug use: No  . Sexual activity: Not on file   Other Topics Concern  . Not on file   Social History Narrative  . No narrative on file     Family History:  The patient's family history includes Healthy in his brother, mother, and sister; Heart failure in his father; Rheumatic fever in his father.   ROS:   Please see the history of present illness.    Developing hearing loss, decreased appetite, fatigue and weight loss.  All other systems reviewed and are negative.   PHYSICAL EXAM:   VS:  BP 134/62   Pulse 69   Ht '5\' 9"'$  (1.753 m)   Wt 139 lb 3.2 oz (63.1 kg)   BMI 20.56 kg/m    GEN: Well nourished, well developed, in no acute distress  HEENT: normal  Neck: no JVD, carotid bruits, or masses Cardiac: RRR; no murmurs, rubs, or gallops,no edema  Respiratory:  clear to  auscultation bilaterally, normal work of breathing GI: soft, nontender, nondistended, + BS MS: no deformity or atrophy  Skin: warm and dry, no rash Neuro:  Alert and Oriented x 3, Strength and sensation are intact Psych: euthymic mood, full affect  Wt Readings from Last 3 Encounters:  12/30/15 139 lb 3.2 oz (63.1 kg)  09/24/14 148 lb 12.8 oz (67.5 kg)  09/06/13 144 lb 6.4 oz (65.5 kg)      Studies/Labs Reviewed:   EKG:  EKG  Sinus rhythm at 69 bpm, biatrial abnormality, right bundle branch block, nonspecific T wave abnormality  Recent Labs: No results found for requested labs within last 8760 hours.   Lipid Panel    Component Value Date/Time   CHOL 171 09/25/2014 0954   TRIG 54.0 09/25/2014 0954   HDL 64.60 09/25/2014 0954   CHOLHDL 3 09/25/2014 0954   VLDL 10.8 09/25/2014 0954   LDLCALC 96  09/25/2014 0954    Additional studies/ records that were reviewed today include:  No new data to evaluate.    ASSESSMENT:    1. Coronary artery disease involving native coronary artery of native heart without angina pectoris   2. Essential hypertension   3. Right bundle branch block   4. Hyperlipidemia   5. Sinoatrial node dysfunction (HCC)      PLAN:  In order of problems listed above:  1. No symptoms to suggest angina or infarction. Continue risk factor modification. 2. Blood pressure is very well controlled and perhaps excessive. Recording the pressure with the small cuff in the right arm reveals 100/52 mmHg. Plan to decrease amlodipine to 2.5 mg per day. 3. It seems as though weight loss is related to decreased caloric intake. He is on high-dose statin therapy. I will give him a statin holiday 4 months to see if appetite improves and if he can start to get better with reference to fatigue. 4. Decreasing the amlodipine dose and stopping statin therapy may help with fatigue if they are related. 5. Liver and lipid panel today will help determine what the new dose of statin will be if we resume 6. EKG is not shown any change with right bundle branch block and sinus rhythm at 67 bpm.  Medication Adjustments/Labs and Tests Ordered: Current medicines are reviewed at length with the patient today.  Concerns regarding medicines are outlined above.  Medication changes, Labs and Tests ordered today are listed in the Patient Instructions below. Patient Instructions  Medication Instructions:  Your physician has recommended you make the following change in your medication:  1.  STOP the Atorvastatin 2.  DECREASE the Amlodipine to 2.5 mg taking 1 tablet daily    Labwork: TODAY;  LIPID & LIVER  Testing/Procedures: None ordered  Follow-Up: Your physician wants you to follow-up in: [place] will receive a reminder letter in the mail two months in advance. If you  don't receive a letter, please call our office to schedule the follow-up appointment.   Any Other Special Instructions Will Be Listed Below (If Applicable).     If you need a refill on your cardiac medications before your next appointment, please call your pharmacy.      Signed, Sinclair Grooms, MD  12/30/2015 10:08 AM    [facility] [place], [place], [place]  [address] Phone: [phone]; Fax: [phone]

## 2015-12-30 NOTE — Patient Instructions (Signed)
Medication Instructions:  Your physician has recommended you make the following change in your medication:  1.  STOP the Atorvastatin 2.  DECREASE the Amlodipine to 2.5 mg taking 1 tablet daily    Labwork: TODAY;  LIPID & LIVER  Testing/Procedures: None ordered  Follow-Up: Your physician wants you to follow-up in: [place] will receive a reminder letter in the mail two months in advance. If you don't receive a letter, please call our office to schedule the follow-up appointment.   Any Other Special Instructions Will Be Listed Below (If Applicable).     If you need a refill on your cardiac medications before your next appointment, please call your pharmacy.

## 2016-01-27 DIAGNOSIS — H900 Conductive hearing loss, bilateral: Secondary | ICD-10-CM | POA: Diagnosis not present

## 2016-02-05 ENCOUNTER — Telehealth: Payer: Self-pay | Admitting: Interventional Cardiology

## 2016-02-05 NOTE — Telephone Encounter (Signed)
I recommend that he resume atorvastatin at the previous dose as before. Monitor blood pressure and notify us if it is consistently greater than 140/90 mmHg off amlodipine.

## 2016-02-05 NOTE — Telephone Encounter (Signed)
Left message to call back  

## 2016-02-05 NOTE — Telephone Encounter (Signed)
New Message:    Pt said Dr Tamala Julian told him to call back in a month and they would discuss how he was doing with his new medicine.Pt is expecting Dr Tamala Julian to call him,I told him it might be his nurse.

## 2016-02-05 NOTE — Telephone Encounter (Signed)
Pt states that he was to call and update Dr. Tamala Julian on how he was doing with is new medication changes.  On 8/22 statin medication was d/c'ed and Amlodipine was decreased to 2.'5mg'$ .  Pt states he is doing fine but not sure if he is really "any better".  Pt does feel that he has a little more energy.  Pt states that he felt good when he was here for his visit and still feels good at this time.  Pt did not have any vitals available but denies any symptoms of elevated BP.  Advised pt that I would make Dr. Tamala Julian aware and would call if he had any recommendations.

## 2016-02-06 MED ORDER — ATORVASTATIN CALCIUM 80 MG PO TABS: 80.0000 mg | ORAL_TABLET | Freq: Every day | ORAL | 3 refills | Status: DC

## 2016-02-06 NOTE — Telephone Encounter (Signed)
Spoke with pt and advised him of Dr. Thompson Caul recommendations.  Pt verbalized understanding and was in agreement with this plan.

## 2016-02-09 ENCOUNTER — Telehealth: Payer: Self-pay | Admitting: Interventional Cardiology

## 2016-02-09 NOTE — Telephone Encounter (Signed)
New Message:    Pt says he need to talk to you about high blood pressure.He wants to know if he should go back to old blood pressure medicine dose.Readings of 170 ranging to 190.

## 2016-02-09 NOTE — Telephone Encounter (Signed)
Pt states that he monitored his BP over the weekend and his BP stayed between 170-190/63-80, no HR available.  Pt denies any HTN symptoms.  Pt states he is leaving for [facility] on Thurs for 2 and a half weeks so if we make any med changes he needs them sent to [facility] on [address] so that he can pick them up prior to leaving.  Advised pt that I will send message to Dr. Tamala Julian for review and advisement.  Pt appreciative for all assistance.

## 2016-02-10 NOTE — Telephone Encounter (Signed)
Follow up   Pt verbalized that he is returning call for rn pertaining to the last conversation with her, [place] leaving out [place] 02[place]12[place]16.

## 2016-02-10 NOTE — Telephone Encounter (Signed)
Resume amlodipine at his previous dose, which appears to be 7.5 mg daily instead of 5 mg per day.

## 2016-02-10 NOTE — Telephone Encounter (Signed)
Dr. Tamala Julian recommends for pt to go back to Amlodipine dose that he was taking before the dose was decreased due to low BP readings. Pt will go back to take Amlodipine 7.5 mg daily. Pt is aware. He verbalized understanding.

## 2016-02-11 MED ORDER — AMLODIPINE BESYLATE 5 MG PO TABS: 7.5000 mg | ORAL_TABLET | Freq: Every day | ORAL | 3 refills | Status: DC

## 2016-02-11 NOTE — Addendum Note (Signed)
Addended by: Loren Racer on: 02/11/2016 08:06 AM   Modules accepted: Orders

## 2016-03-02 ENCOUNTER — Other Ambulatory Visit: Payer: Self-pay | Admitting: Interventional Cardiology

## 2016-03-03 DIAGNOSIS — J0120 Acute ethmoidal sinusitis, unspecified: Secondary | ICD-10-CM | POA: Diagnosis not present

## 2016-03-03 DIAGNOSIS — J209 Acute bronchitis, unspecified: Secondary | ICD-10-CM | POA: Diagnosis not present

## 2016-03-16 ENCOUNTER — Ambulatory Visit
Admission: RE | Admit: 2016-03-16 | Discharge: 2016-03-16 | Disposition: A | Discharge status: Home / Self Care | Payer: Medicare Other | Source: Ambulatory Visit | Attending: Internal Medicine | Admitting: Internal Medicine

## 2016-03-16 ENCOUNTER — Other Ambulatory Visit: Payer: Self-pay | Admitting: Internal Medicine

## 2016-03-16 DIAGNOSIS — Z85118 Personal history of other malignant neoplasm of bronchus and lung: Secondary | ICD-10-CM | POA: Diagnosis not present

## 2016-03-16 DIAGNOSIS — Z125 Encounter for screening for malignant neoplasm of prostate: Secondary | ICD-10-CM | POA: Diagnosis not present

## 2016-03-16 DIAGNOSIS — L989 Disorder of the skin and subcutaneous tissue, unspecified: Secondary | ICD-10-CM | POA: Diagnosis not present

## 2016-03-16 DIAGNOSIS — Z1389 Encounter for screening for other disorder: Secondary | ICD-10-CM | POA: Diagnosis not present

## 2016-03-16 DIAGNOSIS — Z8546 Personal history of malignant neoplasm of prostate: Secondary | ICD-10-CM | POA: Diagnosis not present

## 2016-03-16 DIAGNOSIS — I10 Essential (primary) hypertension: Secondary | ICD-10-CM | POA: Diagnosis not present

## 2016-03-16 DIAGNOSIS — E7800 Pure hypercholesterolemia, unspecified: Secondary | ICD-10-CM | POA: Diagnosis not present

## 2016-03-16 DIAGNOSIS — Z0000 Encounter for general adult medical examination without abnormal findings: Secondary | ICD-10-CM | POA: Diagnosis not present

## 2016-03-16 DIAGNOSIS — R634 Abnormal weight loss: Secondary | ICD-10-CM | POA: Diagnosis not present

## 2016-03-16 DIAGNOSIS — R918 Other nonspecific abnormal finding of lung field: Secondary | ICD-10-CM | POA: Diagnosis not present

## 2016-03-19 ENCOUNTER — Other Ambulatory Visit: Payer: Self-pay | Admitting: Internal Medicine

## 2016-03-19 DIAGNOSIS — R9389 Abnormal findings on diagnostic imaging of other specified body structures: Secondary | ICD-10-CM

## 2016-03-26 ENCOUNTER — Ambulatory Visit
Admission: RE | Admit: 2016-03-26 | Discharge: 2016-03-26 | Disposition: A | Discharge status: Home / Self Care | Payer: Medicare Other | Source: Ambulatory Visit | Attending: Internal Medicine | Admitting: Internal Medicine

## 2016-03-26 DIAGNOSIS — R9389 Abnormal findings on diagnostic imaging of other specified body structures: Secondary | ICD-10-CM

## 2016-03-26 DIAGNOSIS — R918 Other nonspecific abnormal finding of lung field: Secondary | ICD-10-CM | POA: Diagnosis not present

## 2016-03-26 MED ORDER — IOPAMIDOL (ISOVUE-300) INJECTION 61%
75.0000 mL | Freq: Once | INTRAVENOUS | Status: AC | PRN
  Administered 2016-03-26: 75 mL via INTRAVENOUS

## 2016-04-08 ENCOUNTER — Encounter: Payer: Self-pay | Admitting: Pulmonary Disease

## 2016-04-08 ENCOUNTER — Ambulatory Visit (INDEPENDENT_AMBULATORY_CARE_PROVIDER_SITE_OTHER): Payer: Medicare Other | Admitting: Pulmonary Disease

## 2016-04-08 VITALS — BP 116/62 | HR 72 | Ht 69.0 in | Wt 141.0 lb

## 2016-04-08 DIAGNOSIS — R9389 Abnormal findings on diagnostic imaging of other specified body structures: Secondary | ICD-10-CM

## 2016-04-08 DIAGNOSIS — J439 Emphysema, unspecified: Secondary | ICD-10-CM | POA: Diagnosis not present

## 2016-04-08 DIAGNOSIS — R938 Abnormal findings on diagnostic imaging of other specified body structures: Secondary | ICD-10-CM | POA: Diagnosis not present

## 2016-04-08 DIAGNOSIS — R911 Solitary pulmonary nodule: Secondary | ICD-10-CM | POA: Diagnosis not present

## 2016-04-08 DIAGNOSIS — J449 Chronic obstructive pulmonary disease, unspecified: Secondary | ICD-10-CM | POA: Insufficient documentation

## 2016-04-08 NOTE — Patient Instructions (Signed)
It was a pleasure taking care of you today!  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  We will get a chest CT scan in May and follow-up will be after that. Call the office if you're more symptomatic prior to that so that   we can see her sooner   Return to clinic in May 2018

## 2016-04-08 NOTE — Assessment & Plan Note (Signed)
Not a significant smoker and with good fxnal capacity. Chest ct scan with COPD changes. Observe for now.

## 2016-04-08 NOTE — Progress Notes (Addendum)
Subjective:    Patient ID: Richard [name], male    DOB: 1934-07-10, 81 y.o.   MRN: [id number]  HPI   This is the case of Richard [name], 81 y.o. Male, who was referred by Dr. Lavone Orn  in consultation regarding abnormal chest ct scan.   As you very well know, patient has a 15 PY smoking history, quit in 1965, not been diagnosed with copd or asthma, had LUL lobectomy for Lung CA in 07/2008.  He had chest ct scan in 2010 and several others since that time until 03/2016.  I reviewed all chest ct scans with the patient and his wife.  The earliest chest ct scan showed some nodular infiltrates over at the RLL.  Prior to chest ct scan in 03/2016, he had a chest ct scan in 08/2013.  Chest ct scan in 4/ 2015 showed slight progression of micronodular infiltrates compared to previous ct scan. He had a CXR this year after he had cough/bronchitis.  CXR showed increase infiltrate at the R lung and that prompted the chest ct scan.  Chest ct scan in 03/2016 showed extensive tree in bud opacities with associated clustered peripheral nodular foci of consolidation and mild bronchiectasis.   Currently, pt states he denies fevers, SOB, cough. He has had anorexia since 2010 and has gotten worse.  He  has been losing weight since LUL lobectomy 2/2 poor appetite.  He has lost 30 lbs over 7 yrs. He has lost 3 lbs this year. Other than the weight loss and anorexia, patient states he is doing well.   Patient and his wife live in [place] half the time as well as in [place].  They plan on going to [place] in December for 2 months.   (-) h/o PTB. (-) molds, stagnant water, chemical exposure. He has clean houses. Clean AC filters.   Review of Systems  Constitutional: Positive for appetite change and unexpected weight change. Negative for fever.  HENT: Negative.  Negative for congestion, dental problem, ear pain, nosebleeds, postnasal drip, rhinorrhea, sinus pressure, sneezing, sore throat and trouble swallowing.   Eyes:  Negative.  Negative for redness and itching.  Respiratory: Positive for shortness of breath. Negative for cough, chest tightness and wheezing.   Cardiovascular: Negative.  Negative for palpitations and leg swelling.  Gastrointestinal: Negative.  Negative for nausea and vomiting.  Endocrine: Negative.   Genitourinary: Negative.  Negative for dysuria.  Musculoskeletal: Negative.  Negative for joint swelling.  Skin: Negative.  Negative for rash.  Allergic/Immunologic: Negative.   Neurological: Negative.  Negative for headaches.  Hematological: Bruises/bleeds easily.  Psychiatric/Behavioral: Negative.  Negative for dysphoric mood. The patient is not nervous/anxious.   All other systems reviewed and are negative.  Past Medical History:  Diagnosis Date  . Coronary artery disease   . Coronary artery stenosis   . Hypercholesteremia   . Hypertension   . lung ca dx'd 07/2008   (-) DVT.   Family History  Problem Relation Age of Onset  . Healthy Mother   . Heart failure Father   . Rheumatic fever Father   . Healthy Sister   . Healthy Brother      Past Surgical History:  Procedure Laterality Date  . bilateral inguinal hernia repair    . CORNEAL TRANSPLANT    . Left video-assisted thoracic surgery, mini-  10/24/2008    Social History   Social History  . Marital status: Married    Spouse name: N/A  . Number of  children: N/A  . Years of education: N/A   Occupational History  . Not on file.   Social History Main Topics  . Smoking status: Former Smoker    Quit date: 05/10/1962  . Smokeless tobacco: Never Used  . Alcohol use Yes  . Drug use: No  . Sexual activity: Not on file   Other Topics Concern  . Not on file   Social History Narrative  . No narrative on file   Married, was a financial person. Occasional ETOH. Has been all over.   Allergies  Allergen Reactions  . Aspirin Hives     Outpatient Medications Prior to Visit  Medication Sig Dispense Refill  . amLODipine  (NORVASC) 5 MG tablet Take 1.5 tablets (7.5 mg total) by mouth daily. 135 tablet 3  . atorvastatin (LIPITOR) 80 MG tablet Take 1 tablet (80 mg total) by mouth daily. 90 tablet 3  . brimonidine (ALPHAGAN P) 0.1 % SOLN Place 2 drops into the left eye daily.     . clopidogrel (PLAVIX) 75 MG tablet TAKE 1 TABLET BY MOUTH EVERY DAY 90 tablet 2  . dorzolamide-timolol (COSOPT) 22.3-6.8 MG/ML ophthalmic solution Place 1 drop into both eyes 2 (two) times daily.     . Fluorometholone (FML OP) Place 1 drop into both eyes daily.     Marland Kitchen latanoprost (XALATAN) 0.005 % ophthalmic solution Place 1 drop into both eyes at bedtime.     . nitroGLYCERIN (NITROSTAT) 0.4 MG SL tablet Place 0.4 mg under the tongue every 5 (five) minutes as needed.       No facility-administered medications prior to visit.    No orders of the defined types were placed in this encounter.        Objective:   Physical Exam  Vitals:  Vitals:   04/08/16 1411  BP: 116/62  Pulse: 72  SpO2: 99%  Weight: 141 lb (64 kg)  Height: '5\' 9"'$  (1.753 m)    Constitutional/General:  Pleasant, well-nourished, well-developed, not in any distress,  Comfortably seating.  Well kempt  Body mass index is 20.82 kg/m. Wt Readings from Last 3 Encounters:  04/08/16 141 lb (64 kg)  12/30/15 139 lb 3.2 oz (63.1 kg)  09/24/14 148 lb 12.8 oz (67.5 kg)     HEENT: Pupils equal and reactive to light and accommodation. Anicteric sclerae. Normal nasal mucosa.   No oral  lesions,  mouth clear,  oropharynx clear, no postnasal drip. (-) Oral thrush. No dental caries.  Airway - Mallampati class III  Neck: No masses. Midline trachea. No JVD, (-) LAD. (-) bruits appreciated.  Respiratory/Chest: Grossly normal chest. (-) deformity. (-) Accessory muscle use.  Symmetric expansion. (-) Tenderness on palpation.  Resonant on percussion.  Diminished BS on both lower lung zones. (-) wheezing, crackles, rhonchi. Clear breath sounds.  (-)  egophony  Cardiovascular: Regular rate and  rhythm, heart sounds normal, no murmur or gallops, no peripheral edema  Gastrointestinal:  Normal bowel sounds. Soft, non-tender. No hepatosplenomegaly.  (-) masses.   Musculoskeletal:  Normal muscle tone. Normal gait.   Extremities: Grossly normal. (-) clubbing, cyanosis.  (-) edema  Skin: (-) rash,lesions seen.   Neurological/Psychiatric : alert, oriented to time, place, person. Normal mood and affect          Assessment & Plan:  Abnormal CT scan, chest Pt with abnormal chest ct scan since 2010.  Specifically, he has nodular infiltrates worsening through time.  First ct scan was in 2010 when he was diagnosed with  LUL Lung CA for which he ended up getting lobectomy.  He had serial chest ct scans after that for 5 yrs which showed slow progression of nodular infiltrates.  He had a chest ct scan in 03/2016 which showed more nodular infiltrates in RUL/RML/RLL and some in LLL.   Pt is asymptomatic except for anorexia (since lung CA surgery in 2010) and weight loss associated with anorexia.   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  Most likely MAC.  I extensively d/w pt and wife re: diagnosis of MAC as well as treatment for MAC or NTM.  He will need diagnostic bronchoscopy.  Once we have cultures, he will need to be on at least 3 meds (Rifampin, Ethambutol, Azithromycin) at least 3 times a week (since it is mostly nodular) for at least 1-2 years.    Pt did not appear to be too keen as far as being on meds as the meds also have side effects.  Pt and wife also travel between [place] and [place].   As he is not too symptomatic right now, we decided to hold off on bronchoscopy and treating MAC.  I advised them to give me a call if he gets more symptomatic (fever, cough, more weight loss, generalized weakness).  At that time, he will need a bronchoscopy and be started on meds.   We will plan on repeating a  chest ct scan in 6 months.  Obviously, if he has more infiltrates, that will probably convince the pt to be started on treatment for MAC.   COPD (chronic obstructive pulmonary disease) (HCC) Not a significant smoker and with good fxnal capacity. Chest ct scan with COPD changes. Observe for now.    CARCINOMA, LUNG, NONSMALL CELL S/P LUL lobectomy in 2010 for NSCLCA.    I personally reviewed previous images (Chest Xray, Chest Ct scan) done on this patient. I reviewed the reports on the images as well.   Thank you very much for letting me participate in this patient's care. Please do not hesitate to give me a call if you have any questions or concerns regarding the treatment plan.   Patient will follow up with me in 6 months    J. Shirl Harris, MD 04/08/2016   7:27 PM [facility] Pager: [phone] Office: [phone], Fax: [phone]

## 2016-04-08 NOTE — Assessment & Plan Note (Addendum)
Pt with abnormal chest ct scan since 2010.  Specifically, he has nodular infiltrates worsening through time.  First ct scan was in 2010 when he was diagnosed with LUL Lung CA for which he ended up getting lobectomy.  He had serial chest ct scans after that for 5 yrs which showed slow progression of nodular infiltrates.  He had a chest ct scan in 03/2016 which showed more nodular infiltrates in RUL/RML/RLL and some in LLL.   Pt is asymptomatic except for anorexia (since lung CA surgery in 2010) and weight loss associated with anorexia.   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  Most likely MAC.  I extensively d/w pt and wife re: diagnosis of MAC as well as treatment for MAC or NTM.  He will need diagnostic bronchoscopy.  Once we have cultures, he will need to be on at least 3 meds (Rifampin, Ethambutol, Azithromycin) at least 3 times a week (since it is mostly nodular) for at least 1-2 years.    Pt did not appear to be too keen as far as being on meds as the meds also have side effects.  Pt and wife also travel between [place] and [place].   As he is not too symptomatic right now, we decided to hold off on bronchoscopy and treating MAC.  I advised them to give me a call if he gets more symptomatic (fever, cough, more weight loss, generalized weakness).  At that time, he will need a bronchoscopy and be started on meds.   We will plan on repeating a chest ct scan in 6 months.  Obviously, if he has more infiltrates, that will probably convince the pt to be started on treatment for MAC.

## 2016-04-08 NOTE — Assessment & Plan Note (Signed)
S/P LUL lobectomy in 2010 for [place].

## 2016-04-12 DIAGNOSIS — H4089 Other specified glaucoma: Secondary | ICD-10-CM | POA: Diagnosis not present

## 2016-04-27 ENCOUNTER — Other Ambulatory Visit: Payer: Self-pay | Admitting: Interventional Cardiology

## 2016-05-06 ENCOUNTER — Telehealth: Payer: Self-pay | Admitting: Family Medicine

## 2016-05-06 ENCOUNTER — Telehealth: Payer: Self-pay | Admitting: Interventional Cardiology

## 2016-05-06 NOTE — Telephone Encounter (Signed)
error 

## 2016-05-06 NOTE — Telephone Encounter (Signed)
New message       *STAT* If patient is at the pharmacy, call can be transferred to refill team.   1. Which medications need to be refilled? (please list name of each medication and dose if known) atorvastatin '80mg'$  2. Which pharmacy/location (including street and city if local pharmacy) is medication to be sent to?  [facility] at [address] 3. Do they need a 30 day or 90 day supply? 90 day supply

## 2016-05-06 NOTE — Telephone Encounter (Signed)
atorvastatin (LIPITOR) 80 MG tablet  Medication  Date: 02/06/2016 Department: [facility] Ordering/Authorizing: Belva Crome, MD  Order Providers   Prescribing Provider Encounter Provider  Belva Crome, MD Belva Crome, MD  Medication Detail    Disp Refills Start End   atorvastatin (LIPITOR) 80 MG tablet 90 tablet 3 02/06/2016 05/06/2016   Sig - Route: Take 1 tablet (80 mg total) by mouth daily. - Oral   Notes to Pharmacy: Place on hold please. Pt has plenty at home. He will call when he needs refill.   E-Prescribing Status: Receipt confirmed by pharmacy (02/06/2016 12:13 PM EDT)   Pharmacy   [id number] - [place], [address] AT [address]   pt has refills of atorvastatin at pharmacy, informed him of this.

## 2016-06-05 DIAGNOSIS — R05 Cough: Secondary | ICD-10-CM | POA: Diagnosis not present

## 2016-06-05 DIAGNOSIS — R509 Fever, unspecified: Secondary | ICD-10-CM | POA: Diagnosis not present

## 2016-06-05 DIAGNOSIS — J181 Lobar pneumonia, unspecified organism: Secondary | ICD-10-CM | POA: Diagnosis not present

## 2016-06-07 DIAGNOSIS — R05 Cough: Secondary | ICD-10-CM | POA: Diagnosis not present

## 2016-06-07 DIAGNOSIS — J181 Lobar pneumonia, unspecified organism: Secondary | ICD-10-CM | POA: Diagnosis not present

## 2016-06-07 DIAGNOSIS — R0902 Hypoxemia: Secondary | ICD-10-CM | POA: Diagnosis not present

## 2016-06-11 DIAGNOSIS — R0902 Hypoxemia: Secondary | ICD-10-CM | POA: Diagnosis not present

## 2016-06-11 DIAGNOSIS — R05 Cough: Secondary | ICD-10-CM | POA: Diagnosis not present

## 2016-06-11 DIAGNOSIS — J181 Lobar pneumonia, unspecified organism: Secondary | ICD-10-CM | POA: Diagnosis not present

## 2016-07-27 ENCOUNTER — Other Ambulatory Visit: Payer: Self-pay | Admitting: Interventional Cardiology

## 2016-09-01 DIAGNOSIS — H1851 Endothelial corneal dystrophy: Secondary | ICD-10-CM | POA: Diagnosis not present

## 2016-10-06 ENCOUNTER — Other Ambulatory Visit: Payer: Medicare Other

## 2016-10-12 DIAGNOSIS — H4089 Other specified glaucoma: Secondary | ICD-10-CM | POA: Diagnosis not present

## 2016-10-12 DIAGNOSIS — H401112 Primary open-angle glaucoma, right eye, moderate stage: Secondary | ICD-10-CM | POA: Diagnosis not present

## 2016-10-19 ENCOUNTER — Other Ambulatory Visit: Payer: Self-pay | Admitting: Internal Medicine

## 2016-10-19 DIAGNOSIS — R9389 Abnormal findings on diagnostic imaging of other specified body structures: Secondary | ICD-10-CM

## 2016-10-20 ENCOUNTER — Ambulatory Visit (INDEPENDENT_AMBULATORY_CARE_PROVIDER_SITE_OTHER)
Admission: RE | Admit: 2016-10-20 | Discharge: 2016-10-20 | Disposition: A | Discharge status: Home / Self Care | Payer: Medicare Other | Source: Ambulatory Visit | Attending: Internal Medicine | Admitting: Internal Medicine

## 2016-10-20 ENCOUNTER — Inpatient Hospital Stay: Admission: RE | Admit: 2016-10-20 | Payer: Medicare Other | Source: Ambulatory Visit

## 2016-10-20 DIAGNOSIS — R938 Abnormal findings on diagnostic imaging of other specified body structures: Secondary | ICD-10-CM

## 2016-10-20 DIAGNOSIS — R918 Other nonspecific abnormal finding of lung field: Secondary | ICD-10-CM | POA: Diagnosis not present

## 2016-10-20 DIAGNOSIS — R9389 Abnormal findings on diagnostic imaging of other specified body structures: Secondary | ICD-10-CM

## 2016-10-21 NOTE — Progress Notes (Signed)
Spoke with pt and notified of results per Dr. Melvyn Novas. Pt verbalized understanding and denied any questions. Pt already set to see MW on 10/26/16

## 2016-10-25 ENCOUNTER — Ambulatory Visit: Payer: Medicare Other | Admitting: Pulmonary Disease

## 2016-10-26 ENCOUNTER — Other Ambulatory Visit (INDEPENDENT_AMBULATORY_CARE_PROVIDER_SITE_OTHER): Payer: Medicare Other

## 2016-10-26 ENCOUNTER — Encounter: Payer: Self-pay | Admitting: Internal Medicine

## 2016-10-26 ENCOUNTER — Ambulatory Visit (INDEPENDENT_AMBULATORY_CARE_PROVIDER_SITE_OTHER): Payer: Medicare Other | Admitting: Internal Medicine

## 2016-10-26 ENCOUNTER — Ambulatory Visit (INDEPENDENT_AMBULATORY_CARE_PROVIDER_SITE_OTHER)
Admission: RE | Admit: 2016-10-26 | Discharge: 2016-10-26 | Disposition: A | Discharge status: Home / Self Care | Payer: Medicare Other | Source: Ambulatory Visit | Attending: Internal Medicine | Admitting: Internal Medicine

## 2016-10-26 VITALS — BP 118/64 | HR 65 | Ht 69.0 in | Wt 139.0 lb

## 2016-10-26 DIAGNOSIS — R938 Abnormal findings on diagnostic imaging of other specified body structures: Secondary | ICD-10-CM

## 2016-10-26 DIAGNOSIS — R9389 Abnormal findings on diagnostic imaging of other specified body structures: Secondary | ICD-10-CM

## 2016-10-26 DIAGNOSIS — J984 Other disorders of lung: Secondary | ICD-10-CM | POA: Diagnosis not present

## 2016-10-26 LAB — CBC WITH DIFFERENTIAL/PLATELET
BASOS PCT: 0.8 % (ref 0.0–3.0)
Basophils Absolute: 0.1 10*3/uL (ref 0.0–0.1)
EOS ABS: 0.3 10*3/uL (ref 0.0–0.7)
Eosinophils Relative: 4 % (ref 0.0–5.0)
HCT: 38.1 % — ABNORMAL LOW (ref 39.0–52.0)
Hemoglobin: 12.3 g/dL — ABNORMAL LOW (ref 13.0–17.0)
Lymphocytes Relative: 10.4 % — ABNORMAL LOW (ref 12.0–46.0)
Lymphs Abs: 0.8 10*3/uL (ref 0.7–4.0)
MCHC: 32.3 g/dL (ref 30.0–36.0)
MCV: 89.9 fl (ref 78.0–100.0)
MONO ABS: 1.1 10*3/uL — AB (ref 0.1–1.0)
Monocytes Relative: 14.2 % — ABNORMAL HIGH (ref 3.0–12.0)
NEUTROS ABS: 5.6 10*3/uL (ref 1.4–7.7)
NEUTROS PCT: 70.6 % (ref 43.0–77.0)
PLATELETS: 349 10*3/uL (ref 150.0–400.0)
RBC: 4.23 Mil/uL (ref 4.22–5.81)
RDW: 15.8 % — AB (ref 11.5–15.5)
WBC: 7.9 10*3/uL (ref 4.0–10.5)

## 2016-10-26 LAB — SEDIMENTATION RATE: SED RATE: 86 mm/h — AB (ref 0–20)

## 2016-10-26 NOTE — Assessment & Plan Note (Addendum)
This is an extremely common benign condition in the elderly and does not warrant aggressive eval/ rx at this point unless there is a clinical correlation suggesting unaddressed pulmonary infection (purulent sputum, night sweats, unintended wt loss, doe) or evolution of  obvious changes on plain cxr (as opposed to serial CT, which is way over sensitive to make clinical decisions re intervention and treatment in the elderly, who tend to tolerate both dx and treatment poorly) .    Discussed in detail all the  indications, usual  risks and alternatives  relative to the benefits with patient who agrees to proceed with conservative f/u q 3 m, sooner if any symptoms suggestive of chronic pulmonary infection develop   I had an extended discussion with the patient reviewing all relevant studies completed to date and  lasting 25 minutes of a 40  minute transition of care visit to establish with me for first time  re  non-specific but potentially very serious refractory respiratory symptoms of uncertain and potentially multiple  etiologies.   Each maintenance medication was reviewed in detail including most importantly the difference between maintenance and prns and under what circumstances the prns are to be triggered using an action plan format that is not reflected in the computer generated alphabetically organized AVS.    Please see AVS for specific instructions unique to this office visit that I personally wrote and verbalized to the the pt in detail and then reviewed with pt  by my nurse highlighting any changes in therapy/plan of care  recommended at today's visit.

## 2016-10-26 NOTE — Progress Notes (Signed)
Subjective:    Patient ID: Richard [name], male    DOB: Apr 10, 1935,    MRN: [id number]    Brief patient profile: This is the case of Richard [name], 81 yowm , who was referred by Dr. Lavone Orn  in consultation regarding abnormal chest ct scan c/w MAI and unexplained wt loss  With a 15 PY smoking history, quit in 1965, not been diagnosed with copd or asthma, had LUL lobectomy for Lung CA in 07/2008 by Arlyce Dice s adjuvant rx and released to f/u by Mohammed p 5 y neg surveillance       10/26/2016  f/u ov/Amairani Shuey re: transition of care/ pt new to me  Chief Complaint  Patient presents with  . Follow-up    Breathing good.   no cough/ no sweats/ no fever Appetite poor since original surgery/ baseline wt 160-170   Not limited by breathing from desired activities    No obvious day to day or daytime variability or assoc excess/ purulent sputum or mucus plugs or hemoptysis or cp or chest tightness, subjective wheeze or overt sinus or hb symptoms. No unusual exp hx or h/o childhood pna/ asthma or knowledge of premature birth.  Sleeping ok without nocturnal  or early am exacerbation  of respiratory  c/o's or need for noct saba. Also denies any obvious fluctuation of symptoms with weather or environmental changes or other aggravating or alleviating factors except as outlined above   Current Medications, Allergies, Complete Past Medical History, Past Surgical History, Family History, and Social History were reviewed in [employer] record.  ROS  The following are not active complaints unless bolded sore throat, dysphagia, dental problems, itching, sneezing,  nasal congestion or excess/ purulent secretions, ear ache,   fever, chills, sweats, unintended wt loss, classically pleuritic or exertional cp,  orthopnea pnd or leg swelling, presyncope, palpitations, abdominal pain, anorexia, nausea, vomiting, diarrhea  or change in bowel or bladder habits, change in stools or urine,  dysuria,hematuria,  rash, arthralgias, visual complaints, headache, numbness, weakness or ataxia or problems with walking or coordination,  change in mood/affect or memory.             Objective:   Physical Exam   pleasant talkative amb wm nad     10/26/2016        139   04/08/16 141 lb (64 kg)  12/30/15 139 lb 3.2 oz (63.1 kg)  09/24/14 148 lb 12.8 oz (67.5 kg)     HEENT: nl dentition, turbinates bilaterally, and oropharynx. Nl external ear canals without cough reflex   NECK :  without JVD/Nodes/TM/ nl carotid upstrokes bilaterally   LUNGS: no acc muscle use,  Nl contour chest which is clear to A and P bilaterally without cough on insp or exp maneuvers   CV:  RRR  no s3 or murmur or increase in P2, and no edema   ABD:  soft and nontender with nl inspiratory excursion in the supine position. No bruits or organomegaly appreciated, bowel sounds nl  MS:  Nl gait/ ext warm without deformities, calf tenderness, cyanosis or clubbing No obvious joint restrictions   SKIN: warm and dry without lesions    NEURO:  alert, approp, nl sensorium with  no motor or cerebellar deficits apparent.       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 No definite active process is seen currently.    Labs ordered 10/26/2016     Lab Results  Component Value Date   ESRSEDRATE 86 (H) 10/26/2016     EOS                         0.3                                                                   10/26/2016     Assessment & Plan:

## 2016-10-26 NOTE — Patient Instructions (Addendum)
Please remember to go to the lab and x-ray department downstairs in the basement  for your tests - we will call you with the results when they are available.     Please schedule a follow up visit in 6  months but call sooner if needed for persistent cough/ unexplained fever/ sweats or shortness of breath.

## 2016-10-27 ENCOUNTER — Telehealth: Payer: Self-pay | Admitting: Internal Medicine

## 2016-10-27 NOTE — Telephone Encounter (Signed)
Notes recorded by Tanda Rockers, MD on 10/26/2016 at 4:52 PM EDT Call pt: Reviewed cxr and no acute change so no change in recommendations made at ov  Notes recorded by Tanda Rockers, MD on 10/27/2016 at 5:45 AM EDT Call patient : Studies are non diagnostic - after review of all the data would rec f/u ov in 3 m rather than 6 until we get a better handle on where this problem is headed   Spoke with pt in regards to results. Pt is requesting a call from MW directly after 1:00 on 10/28/16. Apt has not been made as of yet for 39mo ROV.  MW please advise. Thanks.

## 2016-10-27 NOTE — Progress Notes (Signed)
LMTCB

## 2016-10-28 NOTE — Telephone Encounter (Signed)
Reviewed labs/ nothing further needed

## 2016-11-08 ENCOUNTER — Other Ambulatory Visit: Payer: Self-pay | Admitting: Interventional Cardiology

## 2016-11-11 ENCOUNTER — Telehealth: Payer: Self-pay | Admitting: Interventional Cardiology

## 2016-11-11 MED ORDER — CLOPIDOGREL BISULFATE 75 MG PO TABS: 75.0000 mg | ORAL_TABLET | Freq: Every day | ORAL | 0 refills | Status: DC

## 2016-11-11 NOTE — Telephone Encounter (Signed)
Pt's medication was sent to pt's pharmacy as requested. Confirmation received.  °

## 2016-11-11 NOTE — Telephone Encounter (Signed)
°*  STAT* If patient is at the pharmacy, call can be transferred to refill team.   1. Which medications need to be refilled? (please list name of each medication and dose if known) Plavix 75mg  ( Needs a New Prescription sent in for 90day supply)     2. Which pharmacy/location (including street and city if local pharmacy) is medication to be sent to?[facility] on [address] and [address]    3. Do they need a 30 day or 90 day supply? [place]

## 2016-11-11 NOTE — Telephone Encounter (Signed)
°*  STAT* If patient is at the pharmacy, call can be transferred to refill team.   1. Which medications need to be refilled? (please list name of each medication and dose if known) Clopidgrel 75 mg  2. Which pharmacy/location (including street and city if local pharmacy) is medication to be sent to? [facility] [place], [address] AT [address] [place]  3. Do they need a 30 day or 90 day supply? 90  Patient states that the prescription called in for 30 days was incorrect and that it should be a 90 day supply

## 2016-11-19 DIAGNOSIS — R630 Anorexia: Secondary | ICD-10-CM | POA: Diagnosis not present

## 2016-11-19 DIAGNOSIS — R918 Other nonspecific abnormal finding of lung field: Secondary | ICD-10-CM | POA: Diagnosis not present

## 2016-11-19 DIAGNOSIS — Z85118 Personal history of other malignant neoplasm of bronchus and lung: Secondary | ICD-10-CM | POA: Diagnosis not present

## 2017-01-26 ENCOUNTER — Other Ambulatory Visit: Payer: Self-pay | Admitting: Interventional Cardiology

## 2017-02-02 ENCOUNTER — Encounter: Payer: Self-pay | Admitting: Interventional Cardiology

## 2017-02-10 ENCOUNTER — Encounter: Payer: Self-pay | Admitting: Internal Medicine

## 2017-02-10 ENCOUNTER — Ambulatory Visit (INDEPENDENT_AMBULATORY_CARE_PROVIDER_SITE_OTHER)
Admission: RE | Admit: 2017-02-10 | Discharge: 2017-02-10 | Disposition: A | Discharge status: Home / Self Care | Payer: Medicare Other | Source: Ambulatory Visit | Attending: Internal Medicine | Admitting: Internal Medicine

## 2017-02-10 ENCOUNTER — Ambulatory Visit (INDEPENDENT_AMBULATORY_CARE_PROVIDER_SITE_OTHER): Payer: Medicare Other | Admitting: Internal Medicine

## 2017-02-10 ENCOUNTER — Other Ambulatory Visit (INDEPENDENT_AMBULATORY_CARE_PROVIDER_SITE_OTHER): Payer: Medicare Other

## 2017-02-10 VITALS — BP 104/64 | HR 64 | Ht 69.0 in | Wt 137.4 lb

## 2017-02-10 DIAGNOSIS — R0609 Other forms of dyspnea: Secondary | ICD-10-CM

## 2017-02-10 DIAGNOSIS — R9389 Abnormal findings on diagnostic imaging of other specified body structures: Secondary | ICD-10-CM | POA: Diagnosis not present

## 2017-02-10 DIAGNOSIS — I2510 Atherosclerotic heart disease of native coronary artery without angina pectoris: Secondary | ICD-10-CM

## 2017-02-10 DIAGNOSIS — R0600 Dyspnea, unspecified: Secondary | ICD-10-CM | POA: Diagnosis not present

## 2017-02-10 LAB — CBC WITH DIFFERENTIAL/PLATELET
BASOS PCT: 1 % (ref 0.0–3.0)
Basophils Absolute: 0.1 10*3/uL (ref 0.0–0.1)
EOS ABS: 0.4 10*3/uL (ref 0.0–0.7)
Eosinophils Relative: 4.9 % (ref 0.0–5.0)
HEMATOCRIT: 42.3 % (ref 39.0–52.0)
Hemoglobin: 13.9 g/dL (ref 13.0–17.0)
LYMPHS ABS: 0.6 10*3/uL — AB (ref 0.7–4.0)
MCHC: 32.9 g/dL (ref 30.0–36.0)
MCV: 90.9 fl (ref 78.0–100.0)
Monocytes Absolute: 1.3 10*3/uL — ABNORMAL HIGH (ref 0.1–1.0)
Monocytes Relative: 15.7 % — ABNORMAL HIGH (ref 3.0–12.0)
NEUTROS ABS: 6 10*3/uL (ref 1.4–7.7)
NEUTROS PCT: 70.9 % (ref 43.0–77.0)
PLATELETS: 326 10*3/uL (ref 150.0–400.0)
RBC: 4.65 Mil/uL (ref 4.22–5.81)
RDW: 15.8 % — AB (ref 11.5–15.5)
WBC: 8.5 10*3/uL (ref 4.0–10.5)

## 2017-02-10 LAB — BASIC METABOLIC PANEL
BUN: 12 mg/dL (ref 6–23)
CHLORIDE: 104 meq/L (ref 96–112)
CO2: 23 meq/L (ref 19–32)
CREATININE: 0.89 mg/dL (ref 0.40–1.50)
Calcium: 9 mg/dL (ref 8.4–10.5)
GFR: 87.39 mL/min (ref >60.00)
GLUCOSE: 98 mg/dL (ref 70–99)
Potassium: 4.1 mEq/L (ref 3.5–5.1)
Sodium: 138 mEq/L (ref 135–145)

## 2017-02-10 LAB — SEDIMENTATION RATE: Sed Rate: 75 mm/hr — ABNORMAL HIGH (ref 0–20)

## 2017-02-10 LAB — TSH: TSH: 4.04 u[IU]/mL (ref 0.35–4.50)

## 2017-02-10 NOTE — Patient Instructions (Signed)
Please remember to go to the lab and x-ray department downstairs in the basement  for your tests - we will call you with the results when they are available.      Please schedule a follow up visit in 6  months but call sooner if needed  

## 2017-02-10 NOTE — Progress Notes (Signed)
Spoke with pt and notified of results per Dr. Wert. Pt verbalized understanding and denied any questions. 

## 2017-02-10 NOTE — Progress Notes (Signed)
Subjective:    Patient ID: Richard [name], male    DOB: [birth year]    MRN: [id number]    Brief patient profile: 72 yowm , who was referred by Dr. Lavone Orn  in consultation by DeDios 04/08/16  regarding abnormal chest ct scan c/w MAI and unexplained wt loss  With a 15 PY smoking history, quit in 1965, not been diagnosed with copd or asthma, had LUL lobectomy for Lung CA in 07/2008 by Arlyce Dice s adjuvant rx and released to f/u by Mohammed p 5 y neg surveillance  rec  q 6 m f/u      History of Present Illness  10/26/2016  f/u ov/Cyanne Delmar re: transition of care/ pt new to me  Chief Complaint  Patient presents with  . Follow-up    Breathing good.   no cough/ no sweats/ no fever Appetite poor since original surgery/ baseline wt 160-170  Not limited by breathing from desired activities   rec F/u q 3 m     02/10/2017  f/u ov/Dai Mcadams re:  ? MAI Chief Complaint  Patient presents with  . Follow-up    4 month follow up - breathing is at baseline, no new complaints  spent 2 months in [place] at 10,000 summer 2018 no change in acitivity tolerance / sats fine when checks  No cough/congestion   No obvious day to day or daytime variability or assoc excess/ purulent sputum or mucus plugs or hemoptysis or cp or chest tightness, subjective wheeze or overt sinus or hb symptoms. No unusual exp hx or h/o childhood pna/ asthma or knowledge of premature birth.  Sleeping ok flat without nocturnal  or early am exacerbation  of respiratory  c/o's or need for noct saba. Also denies any obvious fluctuation of symptoms with weather or environmental changes or other aggravating or alleviating factors except as outlined above   Current Allergies, Complete Past Medical History, Past Surgical History, Family History, and Social History were reviewed in [employer] record.  ROS  The following are not active complaints unless bolded Hoarseness, sore throat, dysphagia, dental problems, itching,  sneezing,  nasal congestion or discharge of excess mucus or purulent secretions, ear ache,   fever, chills, sweats, unintended wt loss or wt gain, classically pleuritic or exertional cp,  orthopnea pnd or leg swelling, presyncope, palpitations, abdominal pain, anorexia, nausea, vomiting, diarrhea  or change in bowel habits or change in bladder habits, change in stools or change in urine, dysuria, hematuria,  rash, arthralgias, visual complaints, headache, numbness, weakness or ataxia or problems with walking or coordination,  change in mood/affect or memory.        Current Meds  Medication Sig  . amLODipine (NORVASC) 5 MG tablet Take 1.5 tablets (7.5 mg total) by mouth daily.  Marland Kitchen atorvastatin (LIPITOR) 80 MG tablet TAKE 1 TABLET EVERY DAY  . brimonidine (ALPHAGAN P) 0.1 % SOLN Place 2 drops into the left eye daily.   . clopidogrel (PLAVIX) 75 MG tablet Take 1 tablet (75 mg total) by mouth daily.  . dorzolamide-timolol (COSOPT) 22.3-6.8 MG/ML ophthalmic solution Place 1 drop into both eyes 2 (two) times daily.   . Fluorometholone (FML OP) Place 1 drop into both eyes daily.   Marland Kitchen latanoprost (XALATAN) 0.005 % ophthalmic solution Place 1 drop into both eyes at bedtime.   . nitroGLYCERIN (NITROSTAT) 0.4 MG SL tablet Place 0.4 mg under the tongue every 5 (five) minutes as needed.  Objective:   Physical Exam   Pleasant talkative amb wm nad   02/10/2017        137  10/26/2016        139   04/08/16 141 lb (64 kg)  12/30/15 139 lb 3.2 oz (63.1 kg)  09/24/14 148 lb 12.8 oz (67.5 kg)     HEENT: nl dentition, turbinates bilaterally, and oropharynx. Nl external ear canals without cough reflex   NECK :  without JVD/Nodes/TM/ nl carotid upstrokes bilaterally   LUNGS: no acc muscle use,  Nl contour chest which is clear to A and P bilaterally without cough on insp or exp maneuvers   CV:  RRR  no s3 or murmur or increase in P2, and no edema   ABD:  soft and  nontender with nl inspiratory excursion in the supine position. No bruits or organomegaly appreciated, bowel sounds nl  MS:  Nl gait/ ext warm without deformities, calf tenderness, cyanosis or clubbing No obvious joint restrictions   SKIN: warm and dry without lesions    NEURO:  alert, approp, nl sensorium with  no motor or cerebellar deficits apparent.      CXR PA and Lateral:   02/10/2017 :    I personally reviewed images and agree with radiology impression as follows:    Nodular and reticular densities are noted laterally in right upper lobe which correspond to abnormalities noted on prior CT scan.:    My impression:  Using apples to apples comparison, nodular changes were much more conspicuous on plain cxr from 03/16/16 than they are now and were clearly bilateral then    Labs ordered/ reviewed:      Chemistry      Component Value Date/Time   NA 138 02/10/2017 0910   NA 142 09/04/2013 0812   K 4.1 02/10/2017 0910   K 4.4 09/04/2013 0812   CL 104 02/10/2017 0910   CL 105 09/04/2012 0813   CO2 23 02/10/2017 0910   CO2 26 09/04/2013 0812   BUN 12 02/10/2017 0910   BUN 11.7 09/04/2013 0812   CREATININE 0.89 02/10/2017 0910   CREATININE 0.8 09/04/2013 0812      Component Value Date/Time   CALCIUM 9.0 02/10/2017 0910   CALCIUM 9.1 09/04/2013 0812   ALKPHOS 119 (H) 02/11/2017 1046   ALKPHOS 110 09/04/2013 0812   AST 17 02/11/2017 1046   AST 16 09/04/2013 0812   ALT 10 02/11/2017 1046   ALT 7 09/04/2013 0812   BILITOT 0.5 02/11/2017 1046   BILITOT 0.49 09/04/2013 0812        Lab Results  Component Value Date   WBC 8.5 02/10/2017   HGB 13.9 02/10/2017   HCT 42.3 02/10/2017   MCV 90.9 02/10/2017   PLT 326.0 02/10/2017       EOS                       0.4                                                                   02/10/2017       Lab Results  Component Value Date   TSH 4.04 02/10/2017          Lab Results  Component Value Date   ESRSEDRATE 75 (H)  02/10/2017   ESRSEDRATE 86 (H) 10/26/2016            Assessment & Plan:

## 2017-02-11 ENCOUNTER — Encounter: Payer: Self-pay | Admitting: Interventional Cardiology

## 2017-02-11 ENCOUNTER — Ambulatory Visit (INDEPENDENT_AMBULATORY_CARE_PROVIDER_SITE_OTHER): Payer: Medicare Other | Admitting: Interventional Cardiology

## 2017-02-11 VITALS — BP 110/64 | HR 54 | Ht 69.0 in | Wt 138.4 lb

## 2017-02-11 DIAGNOSIS — I495 Sick sinus syndrome: Secondary | ICD-10-CM

## 2017-02-11 DIAGNOSIS — I2510 Atherosclerotic heart disease of native coronary artery without angina pectoris: Secondary | ICD-10-CM | POA: Diagnosis not present

## 2017-02-11 DIAGNOSIS — E785 Hyperlipidemia, unspecified: Secondary | ICD-10-CM

## 2017-02-11 DIAGNOSIS — I10 Essential (primary) hypertension: Secondary | ICD-10-CM | POA: Diagnosis not present

## 2017-02-11 DIAGNOSIS — I4510 Unspecified right bundle-branch block: Secondary | ICD-10-CM | POA: Diagnosis not present

## 2017-02-11 LAB — HEPATIC FUNCTION PANEL
ALT: 10 IU/L (ref 0–44)
AST: 17 IU/L (ref 0–40)
Albumin: 3.8 g/dL (ref 3.5–4.7)
Alkaline Phosphatase: 119 IU/L — ABNORMAL HIGH (ref 39–117)
BILIRUBIN TOTAL: 0.5 mg/dL (ref 0.0–1.2)
Bilirubin, Direct: 0.13 mg/dL (ref 0.00–0.40)
TOTAL PROTEIN: 6.7 g/dL (ref 6.0–8.5)

## 2017-02-11 LAB — LIPID PANEL
CHOLESTEROL TOTAL: 165 mg/dL (ref 100–199)
Chol/HDL Ratio: 2.3 ratio (ref 0.0–5.0)
HDL: 72 mg/dL (ref >39)
LDL Calculated: 85 mg/dL (ref 0–99)
TRIGLYCERIDES: 41 mg/dL (ref 0–149)
VLDL Cholesterol Cal: 8 mg/dL (ref 5–40)

## 2017-02-11 NOTE — Progress Notes (Signed)
Cardiology Office Note    Date:  02/11/2017   ID:  Richard [name], DOB [birth year], MRN [id number]  PCP:  Lavone Orn, MD  Cardiologist: Sinclair Grooms, MD   Chief Complaint  Patient presents with  . Coronary Artery Disease    History of Present Illness:  Richard [name] is a 82 y.o. male follow-up of coronary artery disease with known chronic total occlusion of the right coronary collateralized from the left and moderate diagonal disease in 2003, sinus node dysfunction, right bundle branch block, hypertension, and fatigue.   He denies angina. He is physically active. He has not had syncope. Denies palpitations. No transient neurological complaints. Has known chronic occlusion of the right coronary supplied by collaterals. No syncope or near-syncope. He denies orthopnea.   Past Medical History:  Diagnosis Date  . Coronary artery disease   . Coronary artery stenosis   . Hypercholesteremia   . Hypertension   . lung ca dx'd 07/2008    Past Surgical History:  Procedure Laterality Date  . bilateral inguinal hernia repair    . CORNEAL TRANSPLANT    . Left video-assisted thoracic surgery, mini-  10/24/2008    Current Medications: Outpatient Medications Prior to Visit  Medication Sig Dispense Refill  . amLODipine (NORVASC) 5 MG tablet Take 1.5 tablets (7.5 mg total) by mouth daily. 135 tablet 3  . atorvastatin (LIPITOR) 80 MG tablet TAKE 1 TABLET EVERY DAY 90 tablet 0  . brimonidine (ALPHAGAN P) 0.1 % SOLN Place 2 drops into the left eye daily.     . clopidogrel (PLAVIX) 75 MG tablet Take 1 tablet (75 mg total) by mouth daily. 90 tablet 0  . dorzolamide-timolol (COSOPT) 22.3-6.8 MG/ML ophthalmic solution Place 1 drop into both eyes 2 (two) times daily.     . Fluorometholone (FML OP) Place 1 drop into both eyes daily.     Marland Kitchen latanoprost (XALATAN) 0.005 % ophthalmic solution Place 1 drop into both eyes at bedtime.     . nitroGLYCERIN (NITROSTAT) 0.4 MG SL tablet Place 0.4 mg under  the tongue every 5 (five) minutes as needed.       No facility-administered medications prior to visit.      Allergies:   Aspirin   Social History   Social History  . Marital status: Married    Spouse name: N/A  . Number of children: N/A  . Years of education: N/A   Social History Main Topics  . Smoking status: Former Smoker    Quit date: 05/10/1962  . Smokeless tobacco: Never Used  . Alcohol use Yes  . Drug use: No  . Sexual activity: Not Asked   Other Topics Concern  . None   Social History Narrative  . None     Family History:  The patient's family history includes Healthy in his brother, mother, and sister; Heart failure in his father; Rheumatic fever in his father.   ROS:   Please see the history of present illness.    Cough, gradual unexplained weight loss. Abnormal chest x-ray. Decreased appetite.  All other systems reviewed and are negative.   PHYSICAL EXAM:   VS:  BP 110/64 (BP Location: Left Arm)   Pulse (!) 54   Ht 5\' 9"  (1.753 m)   Wt 138 lb 6.4 oz (62.8 kg)   BMI 20.44 kg/m    GEN: Well nourished, well developed, in no acute distress  HEENT: normal  Neck: no JVD, carotid bruits, or masses Cardiac: RRR;  no murmurs, rubs, or gallops,no edema  Respiratory:  clear to auscultation bilaterally, normal work of breathing GI: soft, nontender, nondistended, + BS MS: no deformity or atrophy  Skin: warm and dry, no rash Neuro:  Alert and Oriented x 3, Strength and sensation are intact Psych: euthymic mood, full affect  Wt Readings from Last 3 Encounters:  02/11/17 138 lb 6.4 oz (62.8 kg)  02/10/17 137 lb 6.4 oz (62.3 kg)  10/26/16 139 lb (63 kg)      Studies/Labs Reviewed:   EKG:  EKG  Sinus bradycardia at 54 bpm right bundle branch block, inferior T-wave abnormality, nonspecific T wave abnormality, and when compared to the prior tracing from August 2017, the heart rate is slower.  Recent Labs: 02/10/2017: BUN 12; Creatinine, Ser 0.89; Hemoglobin  13.9; Platelets 326.0; Potassium 4.1; Sodium 138; TSH 4.04   Lipid Panel    Component Value Date/Time   CHOL 161 12/30/2015 1023   TRIG 44 12/30/2015 1023   HDL 72 12/30/2015 1023   CHOLHDL 2.2 12/30/2015 1023   VLDL 9 12/30/2015 1023   LDLCALC 80 12/30/2015 1023    Additional studies/ records that were reviewed today include:  No new cardiac data    ASSESSMENT:    1. Coronary artery disease involving native coronary artery of native heart without angina pectoris   2. Sinoatrial node dysfunction (HCC)   3. Hyperlipidemia LDL goal <70   4. Essential hypertension   5. Right bundle branch block      PLAN:  In order of problems listed above:  1. Aerobic activity is encouraged. He is known to have chronically occluded right coronary. He denies angina. 2. No symptoms. Heart rate is 56 bpm. He has conduction abnormality. Monitor for development of excessive bradycardia requiring pacing. 3. Liver and lipid panel will be obtained today. 4. Well controlled on the current regimen. 5. Unchanged  Clinical follow-up in one year. Liver and lipid panel today. Monitor heart rate and for symptoms of bradycardia  Medication Adjustments/Labs and Tests Ordered: Current medicines are reviewed at length with the patient today.  Concerns regarding medicines are outlined above.  Medication changes, Labs and Tests ordered today are listed in the Patient Instructions below. Patient Instructions  Medication Instructions:  Your physician recommends that you continue on your current medications as directed. Please refer to the Current Medication list given to you today.   Labwork: Lipid and liver today  Testing/Procedures: None  Follow-Up: Your physician wants you to follow-up in: 1 year with Dr. Tamala Julian.  You will receive a reminder letter in the mail two months in advance. If you don't receive a letter, please call our office to schedule the follow-up appointment.   Any Other Special  Instructions Will Be Listed Below (If Applicable).     If you need a refill on your cardiac medications before your next appointment, please call your pharmacy.      Signed, Sinclair Grooms, MD  02/11/2017 10:34 AM    [facility] [place], [place], [place]  [address] Phone: [phone]; Fax: [phone]

## 2017-02-11 NOTE — Patient Instructions (Signed)
Medication Instructions:  Your physician recommends that you continue on your current medications as directed. Please refer to the Current Medication list given to you today.   Labwork: Lipid and liver today  Testing/Procedures: None  Follow-Up: Your physician wants you to follow-up in: 1 year with Dr. Tamala Julian.  You will receive a reminder letter in the mail two months in advance. If you don't receive a letter, please call our office to schedule the follow-up appointment.   Any Other Special Instructions Will Be Listed Below (If Applicable).     If you need a refill on your cardiac medications before your next appointment, please call your pharmacy.

## 2017-02-11 NOTE — Progress Notes (Signed)
ATC x 2 line was busy 

## 2017-02-13 ENCOUNTER — Encounter: Payer: Self-pay | Admitting: Internal Medicine

## 2017-02-13 NOTE — Assessment & Plan Note (Signed)
CT chest 6/131/9  1. 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 Overall, there has been mild improvement in the right upper and middle lobe involvement, although there are 2 enlarging focal components in the right upper lobe as well as new clustered nodularity in the left lower lobe. Given the patient's history, continued follow-up should be considered. - ESR 10/26/2016  = 86 with Eos 0.3  Vs   On 02/10/2017  ESR 75 with Eos 0.4    On plain film comparison clearly trending toward improvement vs last year though low grade MAI certainly not excluded here - the issue is the clinical relevance esp with high esr and trend toward wt loss but I can't be convinced today that pursuing bal or empirical abx x 2 years if we found MAI would have any positive impact on his health  Discussed in detail all the  indications, usual  risks and alternatives  relative to the benefits with patient/wife who agree  to proceed with conservative f/u as outlined  = 6 m f/u ov with cxr - call sooner if needed   I had an extended discussion with the patient reviewing all relevant studies completed to date and  lasting 15 to 20 minutes of a 25 minute visit    Each maintenance medication was reviewed in detail including most importantly the difference between maintenance and prns and under what circumstances the prns are to be triggered using an action plan format that is not reflected in the computer generated alphabetically organized AVS.    Please see AVS for specific instructions unique to this visit that I personally wrote and verbalized to the the pt in detail and then reviewed with pt  by my nurse highlighting any  changes in therapy recommended at today's visit to their plan of care.

## 2017-02-13 NOTE — Assessment & Plan Note (Signed)
Not limited by breathing from desired activities  / remarkably active at altitude for his age, no w/u needed

## 2017-02-14 ENCOUNTER — Telehealth: Payer: Self-pay

## 2017-02-14 NOTE — Progress Notes (Signed)
Spoke with the pt's spouse and notified of results and she verbalized understanding and will inform the pt

## 2017-02-14 NOTE — Telephone Encounter (Signed)
spoke with patient about recent lab work. patient verbalized understanding. results sent to PCP. patient requested copy mailed to him. copy mailed out.

## 2017-02-25 ENCOUNTER — Other Ambulatory Visit: Payer: Self-pay | Admitting: Interventional Cardiology

## 2017-03-23 DIAGNOSIS — D126 Benign neoplasm of colon, unspecified: Secondary | ICD-10-CM | POA: Diagnosis not present

## 2017-03-23 DIAGNOSIS — Z0000 Encounter for general adult medical examination without abnormal findings: Secondary | ICD-10-CM | POA: Diagnosis not present

## 2017-03-23 DIAGNOSIS — I10 Essential (primary) hypertension: Secondary | ICD-10-CM | POA: Diagnosis not present

## 2017-03-23 DIAGNOSIS — Z1389 Encounter for screening for other disorder: Secondary | ICD-10-CM | POA: Diagnosis not present

## 2017-03-23 DIAGNOSIS — R630 Anorexia: Secondary | ICD-10-CM | POA: Diagnosis not present

## 2017-03-23 DIAGNOSIS — E7800 Pure hypercholesterolemia, unspecified: Secondary | ICD-10-CM | POA: Diagnosis not present

## 2017-03-23 DIAGNOSIS — I2510 Atherosclerotic heart disease of native coronary artery without angina pectoris: Secondary | ICD-10-CM | POA: Diagnosis not present

## 2017-03-23 DIAGNOSIS — R9389 Abnormal findings on diagnostic imaging of other specified body structures: Secondary | ICD-10-CM | POA: Diagnosis not present

## 2017-03-23 DIAGNOSIS — Z8546 Personal history of malignant neoplasm of prostate: Secondary | ICD-10-CM | POA: Diagnosis not present

## 2017-04-12 ENCOUNTER — Ambulatory Visit: Payer: Medicare Other | Admitting: Internal Medicine

## 2017-04-14 ENCOUNTER — Other Ambulatory Visit: Payer: Self-pay | Admitting: Interventional Cardiology

## 2017-04-19 DIAGNOSIS — Z947 Corneal transplant status: Secondary | ICD-10-CM | POA: Diagnosis not present

## 2017-04-19 DIAGNOSIS — H547 Unspecified visual loss: Secondary | ICD-10-CM | POA: Diagnosis not present

## 2017-04-19 DIAGNOSIS — H4089 Other specified glaucoma: Secondary | ICD-10-CM | POA: Diagnosis not present

## 2017-04-19 DIAGNOSIS — H26493 Other secondary cataract, bilateral: Secondary | ICD-10-CM | POA: Diagnosis not present

## 2017-04-19 DIAGNOSIS — H401112 Primary open-angle glaucoma, right eye, moderate stage: Secondary | ICD-10-CM | POA: Diagnosis not present

## 2017-04-26 DIAGNOSIS — H401112 Primary open-angle glaucoma, right eye, moderate stage: Secondary | ICD-10-CM | POA: Diagnosis not present

## 2017-04-26 DIAGNOSIS — H4089 Other specified glaucoma: Secondary | ICD-10-CM | POA: Diagnosis not present

## 2017-08-16 DIAGNOSIS — T8131XA Disruption of external operation (surgical) wound, not elsewhere classified, initial encounter: Secondary | ICD-10-CM | POA: Diagnosis not present

## 2017-08-23 ENCOUNTER — Ambulatory Visit (INDEPENDENT_AMBULATORY_CARE_PROVIDER_SITE_OTHER)
Admission: RE | Admit: 2017-08-23 | Discharge: 2017-08-23 | Disposition: A | Discharge status: Home / Self Care | Payer: Medicare Other | Source: Ambulatory Visit | Attending: Internal Medicine | Admitting: Internal Medicine

## 2017-08-23 ENCOUNTER — Encounter: Payer: Self-pay | Admitting: Internal Medicine

## 2017-08-23 ENCOUNTER — Ambulatory Visit (INDEPENDENT_AMBULATORY_CARE_PROVIDER_SITE_OTHER): Payer: Medicare Other | Admitting: Internal Medicine

## 2017-08-23 VITALS — BP 114/74 | HR 83 | Ht 69.0 in | Wt 136.4 lb

## 2017-08-23 DIAGNOSIS — R9389 Abnormal findings on diagnostic imaging of other specified body structures: Secondary | ICD-10-CM

## 2017-08-23 DIAGNOSIS — J984 Other disorders of lung: Secondary | ICD-10-CM | POA: Diagnosis not present

## 2017-08-23 NOTE — Progress Notes (Signed)
Subjective:   Patient ID: Richard [name], male    DOB: 16-May-1934    MRN: [id number]    Brief patient profile: 69 yowm , who was referred by Dr. Lavone Orn  in consultation by DeDios 04/08/16  regarding abnormal chest ct scan c/w MAI and unexplained wt loss  With a 15 PY smoking history, quit in 1965, not been diagnosed with copd or asthma, had LUL lobectomy for Lung CA in 07/2008 by Arlyce Dice s adjuvant rx and released to f/u by Mohammed p 5 y neg surveillance  rec  q 6 m f/u      History of Present Illness  10/26/2016  f/u ov/Luci Bellucci re: transition of care/ pt new to me  Chief Complaint  Patient presents with  . Follow-up    Breathing good.   no cough/ no sweats/ no fever Appetite poor since original surgery/ baseline wt 160-170  Not limited by breathing from desired activities   rec F/u q 3 m     08/23/2017  f/u ov/Elene Downum re:  ? MAI  Chief Complaint  Patient presents with  . Follow-up    6 month follow per patient. Per patient, denies any breathing issues.   Dyspnea:  Not limited by breathing from desired activities  / able to climb steps in [place] stopping one flight Cough: none Sleep: ok    No obvious day to day or daytime variability or assoc excess/ purulent sputum or mucus plugs or hemoptysis or cp or chest tightness, subjective wheeze or overt sinus or hb symptoms. No unusual exposure hx or h/o childhood pna/ asthma or knowledge of premature birth.  Sleeping  ok  without nocturnal  or early am exacerbation  of respiratory  c/o's or need for noct saba. Also denies any obvious fluctuation of symptoms with weather or environmental changes or other aggravating or alleviating factors except as outlined above   Current Allergies, Complete Past Medical History, Past Surgical History, Family History, and Social History were reviewed in [employer] record.  ROS  The following are not active complaints unless bolded Hoarseness, sore throat, dysphagia,  dental problems, itching, sneezing,  nasal congestion or discharge of excess mucus or purulent secretions, ear ache,   fever, chills, sweats, unintended wt loss or wt gain, classically pleuritic or exertional cp,  orthopnea pnd or arm/hand swelling  or leg swelling, presyncope, palpitations, abdominal pain, anorexia, nausea, vomiting, diarrhea  or change in bowel habits or change in bladder habits, change in stools or change in urine, dysuria, hematuria,  rash, arthralgias, visual complaints, headache, numbness, weakness or ataxia or problems with walking or coordination,  change in mood or  memory.        Current Meds  Medication Sig  . amLODipine (NORVASC) 5 MG tablet TAKE 1 AND 1/2 TABLETS(7.5 MG) BY MOUTH DAILY  . atorvastatin (LIPITOR) 80 MG tablet Take 1 tablet (80 mg total) by mouth daily at 6 PM. (Patient taking differently: Take 80 mg by mouth daily. )  . brimonidine (ALPHAGAN P) 0.1 % SOLN Place 2 drops into the left eye daily.   . clopidogrel (PLAVIX) 75 MG tablet TAKE 1 TABLET BY MOUTH DAILY  . dorzolamide-timolol (COSOPT) 22.3-6.8 MG/ML ophthalmic solution Place 1 drop into both eyes 2 (two) times daily.   . Fluorometholone (FML OP) Place 1 drop into both eyes daily.   Marland Kitchen latanoprost (XALATAN) 0.005 % ophthalmic solution Place 1 drop into both eyes at bedtime.   . nitroGLYCERIN (NITROSTAT) 0.4  MG SL tablet Place 0.4 mg under the tongue every 5 (five) minutes as needed.                          Objective:   Physical Exam  amb pleasant wm nad    08/23/2017        136  02/10/2017        137  10/26/2016        139   04/08/16 141 lb (64 kg)  12/30/15 139 lb 3.2 oz (63.1 kg)  09/24/14 148 lb 12.8 oz (67.5 kg)   Vital signs reviewed - Note on arrival 02 sats  99% on RA      HEENT: nl dentition, turbinates bilaterally, and oropharynx. Nl external ear canals without cough reflex   NECK :  without JVD/Nodes/TM/ nl carotid upstrokes bilaterally   LUNGS: no acc muscle use,   Nl contour chest which is clear to A and P bilaterally without cough on insp or exp maneuvers   CV:  RRR  no s3 or murmur or increase in P2, and no edema   ABD:  soft and nontender with nl inspiratory excursion in the supine position. No bruits or organomegaly appreciated, bowel sounds nl  MS:  Nl gait/ ext warm without deformities, calf tenderness, cyanosis or clubbing No obvious joint restrictions   SKIN: warm and dry without lesions    NEURO:  alert, approp, nl sensorium with  no motor or cerebellar deficits apparent.                 CXR PA and Lateral:   08/23/2017 :    I personally reviewed images and agree with radiology impression as follows:   Peripheral nodular densities in the right upper lobe are again noted, grossly stable since prior chest x-ray. These would be better evaluated and followed with chest CT          Assessment & Plan:

## 2017-08-23 NOTE — Patient Instructions (Signed)
Please remember to go to the  x-ray department downstairs in the basement  for your tests - we will call you with the results when they are available.      Please schedule a follow up visit in  6 months but call sooner if needed. 

## 2017-08-24 ENCOUNTER — Encounter: Payer: Self-pay | Admitting: Internal Medicine

## 2017-08-24 NOTE — Progress Notes (Signed)
Left detailed msg ok per [facility]

## 2017-08-24 NOTE — Assessment & Plan Note (Signed)
CT chest 6/13 /18 1. 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 Overall, there has been mild improvement in the right upper and middle lobe involvement, although there are 2 enlarging focal components in the right upper lobe as well as new clustered nodularity in the left lower lobe. Given the patient's history, continued follow-up should be considered. - ESR 10/26/2016  = 86 with Eos 0.3  Vs   On 02/10/2017  ESR 75 with Eos 0.4   Continues to have no clinical symptoms to suggest these ct findings have any important impact on M or M and there has been no plain cxr evidence of progression over last year so f/u can be in 6 months then yearly but certainly sooner if any unexplained or progrressive resp symptoms do develop which I was assured was not the case based on today's eval  Discussed in detail all the  indications, usual  risks and alternatives  relative to the benefits with patient who agrees to proceed with conservative f/u as outlined   I had an extended discussion with the patient reviewing all relevant studies completed to date and  lasting 15 to 20 minutes of a 25 minute visit    Each maintenance medication was reviewed in detail including most importantly the difference between maintenance and prns and under what circumstances the prns are to be triggered using an action plan format that is not reflected in the computer generated alphabetically organized AVS.    Please see AVS for specific instructions unique to this visit that I personally wrote and verbalized to the the pt in detail and then reviewed with pt  by my nurse highlighting any  changes in therapy recommended at today's visit to their plan of care.

## 2017-08-31 DIAGNOSIS — H1851 Endothelial corneal dystrophy: Secondary | ICD-10-CM | POA: Diagnosis not present

## 2017-09-27 DIAGNOSIS — H4089 Other specified glaucoma: Secondary | ICD-10-CM | POA: Diagnosis not present

## 2017-09-27 DIAGNOSIS — H401112 Primary open-angle glaucoma, right eye, moderate stage: Secondary | ICD-10-CM | POA: Diagnosis not present

## 2017-09-27 DIAGNOSIS — T8131XA Disruption of external operation (surgical) wound, not elsewhere classified, initial encounter: Secondary | ICD-10-CM | POA: Diagnosis not present

## 2017-12-27 DIAGNOSIS — H18892 Other specified disorders of cornea, left eye: Secondary | ICD-10-CM | POA: Diagnosis not present

## 2017-12-27 DIAGNOSIS — T1502XA Foreign body in cornea, left eye, initial encounter: Secondary | ICD-10-CM | POA: Diagnosis not present

## 2017-12-27 DIAGNOSIS — H1851 Endothelial corneal dystrophy: Secondary | ICD-10-CM | POA: Diagnosis not present

## 2018-02-22 ENCOUNTER — Encounter: Payer: Self-pay | Admitting: Internal Medicine

## 2018-02-22 ENCOUNTER — Ambulatory Visit (INDEPENDENT_AMBULATORY_CARE_PROVIDER_SITE_OTHER)
Admission: RE | Admit: 2018-02-22 | Discharge: 2018-02-22 | Disposition: A | Discharge status: Home / Self Care | Payer: Medicare Other | Source: Ambulatory Visit | Attending: Internal Medicine | Admitting: Internal Medicine

## 2018-02-22 ENCOUNTER — Other Ambulatory Visit (INDEPENDENT_AMBULATORY_CARE_PROVIDER_SITE_OTHER): Payer: Medicare Other

## 2018-02-22 ENCOUNTER — Ambulatory Visit (INDEPENDENT_AMBULATORY_CARE_PROVIDER_SITE_OTHER): Payer: Medicare Other | Admitting: Internal Medicine

## 2018-02-22 VITALS — BP 118/60 | HR 68 | Ht 68.0 in | Wt 135.0 lb

## 2018-02-22 DIAGNOSIS — R9389 Abnormal findings on diagnostic imaging of other specified body structures: Secondary | ICD-10-CM

## 2018-02-22 DIAGNOSIS — J449 Chronic obstructive pulmonary disease, unspecified: Secondary | ICD-10-CM | POA: Diagnosis not present

## 2018-02-22 LAB — CBC WITH DIFFERENTIAL/PLATELET
BASOS PCT: 1 % (ref 0.0–3.0)
Basophils Absolute: 0.1 10*3/uL (ref 0.0–0.1)
EOS PCT: 2.3 % (ref 0.0–5.0)
Eosinophils Absolute: 0.2 10*3/uL (ref 0.0–0.7)
HCT: 38.3 % — ABNORMAL LOW (ref 39.0–52.0)
Hemoglobin: 12.8 g/dL — ABNORMAL LOW (ref 13.0–17.0)
LYMPHS ABS: 0.6 10*3/uL — AB (ref 0.7–4.0)
Lymphocytes Relative: 7.3 % — ABNORMAL LOW (ref 12.0–46.0)
MCHC: 33.5 g/dL (ref 30.0–36.0)
MCV: 90.5 fl (ref 78.0–100.0)
MONO ABS: 1.1 10*3/uL — AB (ref 0.1–1.0)
Monocytes Relative: 14.1 % — ABNORMAL HIGH (ref 3.0–12.0)
NEUTROS ABS: 6.1 10*3/uL (ref 1.4–7.7)
NEUTROS PCT: 75.3 % (ref 43.0–77.0)
Platelets: 334 10*3/uL (ref 150.0–400.0)
RBC: 4.23 Mil/uL (ref 4.22–5.81)
RDW: 15 % (ref 11.5–15.5)
WBC: 8 10*3/uL (ref 4.0–10.5)

## 2018-02-22 LAB — SEDIMENTATION RATE: Sed Rate: 65 mm/hr — ABNORMAL HIGH (ref 0–20)

## 2018-02-22 NOTE — Patient Instructions (Addendum)
Please remember to go to the lab and x-ray department downstairs in the basement  for your tests - we will call you with the results when they are available.  [facility] is a good choice or [facility] but not both      Please schedule a follow up visit in12  months but call sooner if needed

## 2018-02-22 NOTE — Progress Notes (Signed)
Subjective:   Patient ID: Richard [name], male    DOB: Oct 28, 1934    MRN: [id number]    Brief patient profile: 38 yowm , who was referred by Dr. Lavone Orn  in consultation by DeDios 04/08/16  regarding abnormal chest ct scan c/w MAI and unexplained wt loss  With a 15 PY smoking history, quit in 1965, not been diagnosed with copd or asthma, had LUL lobectomy for Lung CA in 07/2008 by Arlyce Dice s adjuvant rx and released to f/u by Mohammed p 5 y neg surveillance  rec  q 6 m f/u      History of Present Illness  10/26/2016  f/u ov/Estefanie Cornforth re: transition of care/ pt new to me  Chief Complaint  Patient presents with  . Follow-up    Breathing good.   no cough/ no sweats/ no fever Appetite poor since original surgery/ baseline wt 160-170  Not limited by breathing from desired activities   rec F/u q 3 m     08/23/2017  f/u ov/Kion Huntsberry re:  ? MAI  Chief Complaint  Patient presents with  . Follow-up    6 month follow per patient. Per patient, denies any breathing issues.   Dyspnea:  Not limited by breathing from desired activities  / able to climb steps in [place] stopping one flight Cough: none Sleep: ok rec No change rx     02/22/2018  f/u ov/Bryan Omura re: ? MAI  Chief Complaint  Patient presents with  . Follow-up    6 month follow up. States his breathing has been the same since his last visit. Denies any changes.    Dyspnea:  Just moved furniture from [place] where sold his house/ self propelled mower x one hour/ puts out 60 bales pine straw q spring and rakes up leaves q fall  Cough: none Sleeping: flat/ one pillow SABA use: none    No obvious day to day or daytime variability or assoc excess/ purulent sputum or mucus plugs or hemoptysis or cp or chest tightness, subjective wheeze or overt sinus or hb symptoms.   Sleeping as above  without nocturnal  or early am exacerbation  of respiratory  c/o's or need for noct saba. Also denies any obvious fluctuation of symptoms with weather  or environmental changes or other aggravating or alleviating factors except as outlined above   No unusual exposure hx or h/o childhood pna/ asthma or knowledge of premature birth.  Current Allergies, Complete Past Medical History, Past Surgical History, Family History, and Social History were reviewed in [employer] record.  ROS  The following are not active complaints unless bolded Hoarseness, sore throat, dysphagia, dental problems, itching, sneezing,  nasal congestion or discharge of excess mucus or purulent secretions, ear ache,   fever, chills, sweats, unintended wt loss or wt gain, classically pleuritic or exertional cp,  orthopnea pnd or arm/hand swelling  or leg swelling, presyncope, palpitations, abdominal pain, anorexia, nausea, vomiting, diarrhea  or change in bowel habits or change in bladder habits, change in stools or change in urine, dysuria, hematuria,  rash, arthralgias, visual complaints, headache, numbness, weakness or ataxia or problems with walking or coordination,  change in mood or  memory.        Current Meds  Medication Sig  . amLODipine (NORVASC) 5 MG tablet TAKE 1 AND 1/2 TABLETS(7.5 MG) BY MOUTH DAILY  . atorvastatin (LIPITOR) 80 MG tablet Take 1 tablet (80 mg total) by mouth daily at 6 PM. (Patient taking  differently: Take 80 mg by mouth daily. )  . brimonidine (ALPHAGAN P) 0.1 % SOLN Place 2 drops into the left eye daily.   . clopidogrel (PLAVIX) 75 MG tablet TAKE 1 TABLET BY MOUTH DAILY  . dorzolamide-timolol (COSOPT) 22.3-6.8 MG/ML ophthalmic solution Place 1 drop into both eyes 2 (two) times daily.   . Fluorometholone (FML OP) Place 1 drop into both eyes daily.   Marland Kitchen latanoprost (XALATAN) 0.005 % ophthalmic solution Place 1 drop into both eyes at bedtime.   Mckinley Jewel Dimesylate (RHOPRESSA) 0.02 % SOLN Apply 1 drop to eye daily.  . nitroGLYCERIN (NITROSTAT) 0.4 MG SL tablet Place 0.4 mg under the tongue every 5 (five) minutes as needed.                  Objective:   Physical Exam  amb pleasant wm nad   02/22/2018      135  08/23/2017        136  02/10/2017        137  10/26/2016        139   04/08/16 141 lb (64 kg)  12/30/15 139 lb 3.2 oz (63.1 kg)  09/24/14 148 lb 12.8 oz (67.5 kg)        HEENT: nl dentition, turbinates bilaterally, and oropharynx. Nl external ear canals without cough reflex   NECK :  without JVD/Nodes/TM/ nl carotid upstrokes bilaterally   LUNGS: no acc muscle use,  Nl contour chest which is clear to A and P bilaterally without cough on insp or exp maneuvers   CV:  RRR  no s3 or murmur or increase in P2, and no edema   ABD:  soft and nontender with nl inspiratory excursion in the supine position. No bruits or organomegaly appreciated, bowel sounds nl  MS:  Nl gait/ ext warm without deformities, calf tenderness, cyanosis or clubbing No obvious joint restrictions   SKIN: warm and dry without lesions    NEURO:  alert, approp, nl sensorium with  no motor or cerebellar deficits apparent.         CXR PA and Lateral:   02/22/2018 :    I personally reviewed images and agree with radiology impression as follows:    COPD changes with chronic accentuation of markings in the periphery of the RIGHT upper lobe corresponding to suspected atypical mycobacterial infection on prior CT. No new abnormalities.     Lab Results  Component Value Date   ESRSEDRATE 65 (H) 02/22/2018   ESRSEDRATE 75 (H) 02/10/2017   ESRSEDRATE 86 (H) 10/26/2016    Lab Results  Component Value Date   WBC 8.0 02/22/2018   HGB 12.8 (L) 02/22/2018   HCT 38.3 (L) 02/22/2018   MCV 90.5 02/22/2018   PLT 334.0 02/22/2018       EOS                                                               0.2                                    02/22/2018      Assessment & Plan:

## 2018-02-23 NOTE — Progress Notes (Signed)
ATC, NA no VM

## 2018-02-23 NOTE — Progress Notes (Signed)
ATC, NA and no VM  

## 2018-02-24 ENCOUNTER — Encounter: Payer: Self-pay | Admitting: Internal Medicine

## 2018-02-24 NOTE — Assessment & Plan Note (Addendum)
CT chest 6/13 /18 1. 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 Overall, there has been mild improvement in the right upper and middle lobe involvement, although there are 2 enlarging focal components in the right upper lobe as well as new clustered nodularity in the left lower lobe. Given the patient's history, continued follow-up should be considered. - ESR 10/26/2016  = 86 with Eos 0.3  Vs   On 02/10/2017  ESR 75 with Eos 0.4  - ESR 02/22/2018 = 65   Reviewed again with pt: This is an extremely common benign condition in the elderly and does not warrant aggressive eval/ rx at this point unless there is a clinical correlation suggesting unaddressed pulmonary infection (purulent sputum, night sweats, unintended wt loss, doe) or evolution of  obvious changes on plain cxr (as opposed to serial CT, which is way over sensitive to make clinical decisions re intervention and treatment in the elderly, who tend to tolerate both dx and treatment poorly) .    Richard [name] has no symptoms nor progression on plain cxr so f/u q 12 m appropriate at this point - call sooner if new resp symtoms or wt loss, night sweats and in meantime gen medical rx per PCP with one multivit per day rec in addition to flu vaccination assuming already completed pneumoccal vaccinations approp for age.   Discussed in detail all the  indications, usual  risks and alternatives  relative to the benefits with patient who agrees to proceed with conservative f/u as outlined     I had an extended discussion with the patient reviewing all relevant studies completed to date and  lasting 15 to 20 minutes of a 25 minute visit    Each maintenance medication was reviewed in detail including most importantly the difference between maintenance and prns and under what circumstances the prns are to be triggered using an action plan format that is not reflected in the computer generated alphabetically organized AVS.     Please  see AVS for specific instructions unique to this visit that I personally wrote and verbalized to the the pt in detail and then reviewed with pt  by my nurse highlighting any  changes in therapy recommended at today's visit to their plan of care.

## 2018-03-01 NOTE — Progress Notes (Signed)
Cardiology Office Note:    Date:  03/02/2018   ID:  Richard [name], DOB 22-Apr-1935, MRN [id number]  PCP:  Lavone Orn, MD  Cardiologist:  Sinclair Grooms, MD   Referring MD: Lavone Orn, MD   Chief Complaint  Patient presents with  . Coronary Artery Disease    History of Present Illness:    Richard [name] is a 83 y.o. male with a hx of coronary artery disease with known chronic total occlusion of the right coronary collateralized from the left and moderate diagonal disease in 2003, sinus node dysfunction, right bundle branch block, hypertension, and fatigue.   coronary artery disease with known chronic total occlusion of the right coronary collateralized from the left and moderate diagonal disease in 2003, sinus node dysfunction, right bundle branch block, hypertension, and fatigue.    He is doing okay.  He still has concerns about weight loss.  They sold their place in [place] because he and his partner that on the house were both having difficulties with shortness of breath.  His partner was requiring oxygen.  At sea level he has no difficulty.  He has not had chest pain, orthopnea, PND, or ankle swelling.  No episodes of syncope.  He has not needed to use nitroglycerin.  He still exercises.     Past Medical History:  Diagnosis Date  . Coronary artery disease   . Coronary artery stenosis   . Hypercholesteremia   . Hypertension   . lung ca dx'd 07/2008    Past Surgical History:  Procedure Laterality Date  . bilateral inguinal hernia repair    . CORNEAL TRANSPLANT    . Left video-assisted thoracic surgery, mini-  10/24/2008    Current Medications: Current Meds  Medication Sig  . amLODipine (NORVASC) 5 MG tablet Take 1.5 tablets (7.5 mg total) by mouth daily.  Marland Kitchen atorvastatin (LIPITOR) 80 MG tablet Take 1 tablet (80 mg total) by mouth daily at 6 PM. (Patient taking differently: Take 80 mg by mouth daily. )  . brimonidine (ALPHAGAN P) 0.1 % SOLN Place 2 drops into the  left eye daily.   . clopidogrel (PLAVIX) 75 MG tablet Take 1 tablet (75 mg total) by mouth daily.  . dorzolamide-timolol (COSOPT) 22.3-6.8 MG/ML ophthalmic solution Place 1 drop into both eyes 2 (two) times daily.   . Fluorometholone (FML OP) Place 1 drop into both eyes daily.   Marland Kitchen latanoprost (XALATAN) 0.005 % ophthalmic solution Place 1 drop into both eyes at bedtime.   Mckinley Jewel Dimesylate (RHOPRESSA) 0.02 % SOLN Apply 1 drop to eye daily.  . nitroGLYCERIN (NITROSTAT) 0.4 MG SL tablet Place 0.4 mg under the tongue every 5 (five) minutes as needed.    . [DISCONTINUED] amLODipine (NORVASC) 5 MG tablet TAKE 1 AND 1/2 TABLETS(7.5 MG) BY MOUTH DAILY  . [DISCONTINUED] clopidogrel (PLAVIX) 75 MG tablet TAKE 1 TABLET BY MOUTH DAILY     Allergies:   Aspirin   Social History   Socioeconomic History  . Marital status: Married    Spouse name: Not on file  . Number of children: Not on file  . Years of education: Not on file  . Highest education level: Not on file  Occupational History  . Not on file  Social Needs  . Financial resource strain: Not on file  . Food insecurity:    Worry: Not on file    Inability: Not on file  . Transportation needs:    Medical: Not on file  Non-medical: Not on file  Tobacco Use  . Smoking status: Former Smoker    Last attempt to quit: 05/10/1962    Years since quitting: 55.8  . Smokeless tobacco: Never Used  Substance and Sexual Activity  . Alcohol use: Yes  . Drug use: No  . Sexual activity: Not on file  Lifestyle  . Physical activity:    Days per week: Not on file    Minutes per session: Not on file  . Stress: Not on file  Relationships  . Social connections:    Talks on phone: Not on file    Gets together: Not on file    Attends religious service: Not on file    Active member of club or organization: Not on file    Attends meetings of clubs or organizations: Not on file    Relationship status: Not on file  Other Topics Concern  . Not on  file  Social History Narrative  . Not on file     Family History: The patient's family history includes Healthy in his brother, mother, and sister; Heart failure in his father; Rheumatic fever in his father.  ROS:   Please see the history of present illness.    Hearing loss and change in appetite with certain foods is not having the natural taste.  Tomatoes are given him a particular problem.  All other systems reviewed and are negative.  EKGs/Labs/Other Studies Reviewed:    The following studies were reviewed today. He has had no recent imaging or functional studies other than electrocardiograms.  EKG:  EKG is  ordered today.  The ekg ordered today demonstrates demonstrates sinus rhythm, right bundle branch block, and nonspecific ST-T wave abnormality.  When compared to the prior tracing from February 11, 2017, no significant change has occurred.1  Recent Labs: 02/22/2018: Hemoglobin 12.8; Platelets 334.0  Recent Lipid Panel    Component Value Date/Time   CHOL 165 02/11/2017 1046   TRIG 41 02/11/2017 1046   HDL 72 02/11/2017 1046   CHOLHDL 2.3 02/11/2017 1046   CHOLHDL 2.2 12/30/2015 1023   VLDL 9 12/30/2015 1023   LDLCALC 85 02/11/2017 1046    Physical Exam:    VS:  BP 104/70   Pulse 63   Ht 5\' 10"  (1.778 m)   Wt 135 lb 3.2 oz (61.3 kg)   BMI 19.40 kg/m     Wt Readings from Last 3 Encounters:  03/02/18 135 lb 3.2 oz (61.3 kg)  02/22/18 135 lb (61.2 kg)  08/23/17 136 lb 6.4 oz (61.9 kg)     GEN: Quite slender, bordering on frail but, well developed in no acute distress HEENT: Normal NECK: No JVD. LYMPHATICS: No lymphadenopathy CARDIAC: RRR, no murmur, no gallop, no edema. VASCULAR: 2+ bilateral radial pulses.  No bruits. RESPIRATORY:  Clear to auscultation without rales, wheezing or rhonchi  ABDOMEN: Soft, non-tender, non-distended, No pulsatile mass, MUSCULOSKELETAL: No deformity  SKIN: Warm and dry NEUROLOGIC:  Alert and oriented x 3 PSYCHIATRIC:  Normal  affect   ASSESSMENT:    1. Coronary artery disease involving native coronary artery of native heart without angina pectoris   2. Sinoatrial node dysfunction (HCC)   3. Essential hypertension   4. Hyperlipidemia LDL goal <70   5. Right bundle branch block    PLAN:    In order of problems listed above:  1. No symptoms of ongoing ischemia.  Perhaps some of the exertional dyspnea at altitude and at sea level could be related to  ischemia.  At any rate, these complaints are stable now over several years and are not impacting quality of life.  Plan clinical observation with imaging/functional testing if traumatic change.  Will concentrate on secondary risk mitigation. 2. After discontinuation of beta-blocker therapy, sinus node function seems to be normal and there is no evidence of excessive bradycardia.  Plan no change in current course.  Monitor for future developments. 3. Target blood pressure is 130/80 mmHg.  He is tolerating the increased dose of amlodipine without difficulty. 4. LDL target remains 70.  Liver and lipid panel will be obtained today.  Last value was near target 1 year ago. 5. No change in conduction.  See comments above concerning sinoatrial node dysfunction.  I encouraged him to speak with his primary care physician about change in appetite and gradual decrease in weight.  We encouraged aerobic activity greater than 150 minutes/week, LDL less than 70, blood pressure 130/80 mmHg, blood testing to evaluate glycemic control, and encouraged him to call us if any progression of dyspnea, chest pain, or syncope/near syncope..  Greater than 50% of the time during this office visit was spent in education, counseling, and coordination of care related to underlying disease process and testing as outlined.    Medication Adjustments/Labs and Tests Ordered: Current medicines are reviewed at length with the patient today.  Concerns regarding medicines are outlined above.  Orders Placed  This Encounter  Procedures  . Lipid Profile  . Hepatic function panel  . Basic metabolic panel  . EKG 12-Lead   Meds ordered this encounter  Medications  . amLODipine (NORVASC) 5 MG tablet    Sig: Take 1.5 tablets (7.5 mg total) by mouth daily.    Dispense:  135 tablet    Refill:  3  . clopidogrel (PLAVIX) 75 MG tablet    Sig: Take 1 tablet (75 mg total) by mouth daily.    Dispense:  90 tablet    Refill:  3    Patient Instructions  Medication Instructions:  Your physician recommends that you continue on your current medications as directed. Please refer to the Current Medication list given to you today.  If you need a refill on your cardiac medications before your next appointment, please call your pharmacy.   Lab work: Your physician recommends that you return for lab work when you are fasting (Lipid, liver, and BMET)  If you have labs (blood work) drawn today and your tests are completely normal, you will receive your results only by: Marland Kitchen MyChart Message (if you have MyChart) OR . A paper copy in the mail If you have any lab test that is abnormal or we need to change your treatment, we will call you to review the results.  Testing/Procedures: None  Follow-Up: At [facility], you and your health needs are our priority.  As part of our continuing mission to provide you with exceptional heart care, we have created designated Provider Care Teams.  These Care Teams include your primary Cardiologist (physician) and Advanced Practice Providers (APPs -  Physician Assistants and Nurse Practitioners) who all work together to provide you with the care you need, when you need it. You will need a follow up appointment in 12 months.  Please call our office 2 months in advance to schedule this appointment.  You may see Sinclair Grooms, MD or one of the following Advanced Practice Providers on your designated Care Team:   Truitt Merle, NP Cecilie Kicks, NP . Kathyrn Drown, NP  Any  Other Special Instructions Will Be Listed Below (If Applicable).       Signed, Sinclair Grooms, MD  03/02/2018 12:51 PM    [facility]

## 2018-03-02 ENCOUNTER — Encounter: Payer: Self-pay | Admitting: Interventional Cardiology

## 2018-03-02 ENCOUNTER — Ambulatory Visit (INDEPENDENT_AMBULATORY_CARE_PROVIDER_SITE_OTHER): Payer: Medicare Other | Admitting: Interventional Cardiology

## 2018-03-02 VITALS — BP 104/70 | HR 63 | Ht 70.0 in | Wt 135.2 lb

## 2018-03-02 DIAGNOSIS — E785 Hyperlipidemia, unspecified: Secondary | ICD-10-CM

## 2018-03-02 DIAGNOSIS — I2510 Atherosclerotic heart disease of native coronary artery without angina pectoris: Secondary | ICD-10-CM | POA: Diagnosis not present

## 2018-03-02 DIAGNOSIS — I4510 Unspecified right bundle-branch block: Secondary | ICD-10-CM | POA: Diagnosis not present

## 2018-03-02 DIAGNOSIS — I495 Sick sinus syndrome: Secondary | ICD-10-CM

## 2018-03-02 DIAGNOSIS — I10 Essential (primary) hypertension: Secondary | ICD-10-CM

## 2018-03-02 MED ORDER — CLOPIDOGREL BISULFATE 75 MG PO TABS: 75.0000 mg | ORAL_TABLET | Freq: Every day | ORAL | 3 refills | Status: DC

## 2018-03-02 MED ORDER — AMLODIPINE BESYLATE 5 MG PO TABS: 7.5000 mg | ORAL_TABLET | Freq: Every day | ORAL | 3 refills | Status: DC

## 2018-03-02 NOTE — Patient Instructions (Signed)
Medication Instructions:  Your physician recommends that you continue on your current medications as directed. Please refer to the Current Medication list given to you today.  If you need a refill on your cardiac medications before your next appointment, please call your pharmacy.   Lab work: Your physician recommends that you return for lab work when you are fasting (Lipid, liver, and BMET)  If you have labs (blood work) drawn today and your tests are completely normal, you will receive your results only by: Marland Kitchen MyChart Message (if you have MyChart) OR . A paper copy in the mail If you have any lab test that is abnormal or we need to change your treatment, we will call you to review the results.  Testing/Procedures: None  Follow-Up: At [facility], you and your health needs are our priority.  As part of our continuing mission to provide you with exceptional heart care, we have created designated Provider Care Teams.  These Care Teams include your primary Cardiologist (physician) and Advanced Practice Providers (APPs -  Physician Assistants and Nurse Practitioners) who all work together to provide you with the care you need, when you need it. You will need a follow up appointment in 12 months.  Please call our office 2 months in advance to schedule this appointment.  You may see Sinclair Grooms, MD or one of the following Advanced Practice Providers on your designated Care Team:   Truitt Merle, NP Cecilie Kicks, NP . Kathyrn Drown, NP  Any Other Special Instructions Will Be Listed Below (If Applicable).

## 2018-03-08 ENCOUNTER — Other Ambulatory Visit: Payer: Medicare Other | Admitting: *Deleted

## 2018-03-08 DIAGNOSIS — I2510 Atherosclerotic heart disease of native coronary artery without angina pectoris: Secondary | ICD-10-CM | POA: Diagnosis not present

## 2018-03-08 DIAGNOSIS — E785 Hyperlipidemia, unspecified: Secondary | ICD-10-CM | POA: Diagnosis not present

## 2018-03-08 LAB — BASIC METABOLIC PANEL
BUN / CREAT RATIO: 16 (ref 10–24)
BUN: 15 mg/dL (ref 8–27)
CO2: 23 mmol/L (ref 20–29)
Calcium: 8.8 mg/dL (ref 8.6–10.2)
Chloride: 101 mmol/L (ref 96–106)
Creatinine, Ser: 0.92 mg/dL (ref 0.76–1.27)
GFR, EST AFRICAN AMERICAN: 90 mL/min/{1.73_m2} (ref >59)
GFR, EST NON AFRICAN AMERICAN: 78 mL/min/{1.73_m2} (ref >59)
Glucose: 95 mg/dL (ref 65–99)
POTASSIUM: 4.3 mmol/L (ref 3.5–5.2)
SODIUM: 141 mmol/L (ref 134–144)

## 2018-03-08 LAB — LIPID PANEL
CHOLESTEROL TOTAL: 178 mg/dL (ref 100–199)
Chol/HDL Ratio: 2.7 ratio (ref 0.0–5.0)
HDL: 66 mg/dL (ref >39)
LDL Calculated: 102 mg/dL — ABNORMAL HIGH (ref 0–99)
Triglycerides: 50 mg/dL (ref 0–149)
VLDL CHOLESTEROL CAL: 10 mg/dL (ref 5–40)

## 2018-03-08 LAB — HEPATIC FUNCTION PANEL
ALBUMIN: 3.9 g/dL (ref 3.5–4.7)
ALK PHOS: 108 IU/L (ref 39–117)
ALT: 9 IU/L (ref 0–44)
AST: 18 IU/L (ref 0–40)
Bilirubin Total: 0.4 mg/dL (ref 0.0–1.2)
Bilirubin, Direct: 0.14 mg/dL (ref 0.00–0.40)
Total Protein: 6.4 g/dL (ref 6.0–8.5)

## 2018-03-24 ENCOUNTER — Ambulatory Visit
Admission: RE | Admit: 2018-03-24 | Discharge: 2018-03-24 | Disposition: A | Discharge status: Home / Self Care | Payer: Medicare Other | Source: Ambulatory Visit | Attending: Internal Medicine | Admitting: Internal Medicine

## 2018-03-24 ENCOUNTER — Other Ambulatory Visit: Payer: Self-pay | Admitting: Internal Medicine

## 2018-03-24 DIAGNOSIS — E7800 Pure hypercholesterolemia, unspecified: Secondary | ICD-10-CM | POA: Diagnosis not present

## 2018-03-24 DIAGNOSIS — L989 Disorder of the skin and subcutaneous tissue, unspecified: Secondary | ICD-10-CM | POA: Diagnosis not present

## 2018-03-24 DIAGNOSIS — I10 Essential (primary) hypertension: Secondary | ICD-10-CM | POA: Diagnosis not present

## 2018-03-24 DIAGNOSIS — R2232 Localized swelling, mass and lump, left upper limb: Secondary | ICD-10-CM | POA: Diagnosis not present

## 2018-03-24 DIAGNOSIS — Z23 Encounter for immunization: Secondary | ICD-10-CM | POA: Diagnosis not present

## 2018-03-24 DIAGNOSIS — Z85118 Personal history of other malignant neoplasm of bronchus and lung: Secondary | ICD-10-CM | POA: Diagnosis not present

## 2018-03-24 DIAGNOSIS — Z8546 Personal history of malignant neoplasm of prostate: Secondary | ICD-10-CM | POA: Diagnosis not present

## 2018-03-24 DIAGNOSIS — I2510 Atherosclerotic heart disease of native coronary artery without angina pectoris: Secondary | ICD-10-CM | POA: Diagnosis not present

## 2018-03-24 DIAGNOSIS — Z1389 Encounter for screening for other disorder: Secondary | ICD-10-CM | POA: Diagnosis not present

## 2018-03-24 DIAGNOSIS — Z0000 Encounter for general adult medical examination without abnormal findings: Secondary | ICD-10-CM | POA: Diagnosis not present

## 2018-03-29 DIAGNOSIS — H1851 Endothelial corneal dystrophy: Secondary | ICD-10-CM | POA: Diagnosis not present

## 2018-04-11 DIAGNOSIS — H4089 Other specified glaucoma: Secondary | ICD-10-CM | POA: Diagnosis not present

## 2018-05-12 ENCOUNTER — Other Ambulatory Visit: Payer: Self-pay | Admitting: Interventional Cardiology

## 2018-06-12 ENCOUNTER — Telehealth: Payer: Self-pay

## 2018-06-12 NOTE — Telephone Encounter (Signed)
Pt called stating that his insurance wont cover the full amount of his prescription of amlodipine (90 days). In order to get more than 30 days he needs quantity limit exception form from his provider or get a different script to sub for amlodipine. Pharmacy is [facility]. Pt would like confirmation by having either you or Dr. Tamala Julian contact him at [phone]. Please address thank you.

## 2018-06-12 NOTE — Telephone Encounter (Signed)
Richard [name],  Have we received anything like a tier exception or quantity exception form for pt's Amlodipine?

## 2018-06-13 NOTE — Telephone Encounter (Signed)
**Note De-Identified Elton Catalano Obfuscation** I have not received anything concerning the pts Amlodipine.

## 2018-06-13 NOTE — Telephone Encounter (Signed)
**Note De-Identified Richard [name] Obfuscation** I have done a quantity exception through covermymeds on the pts Amlodipine. Key: [id number]

## 2018-06-13 NOTE — Telephone Encounter (Signed)
Spoke with rep from [facility] and they stated that they haven't filled Amlodipine since 2017.  She did a central search and advised me it was [facility] advising on the form.  Spoke with rep from [employer] and she spoke with Pharmacist.  Pharmacist told her that pt's insurance is now requiring a PA since he take 1.5 tablets of the Amlodipine.    Spoke with pt and he states he received a letter from [facility] stating Amlodipine is on his formulary but will require a PA since he is taking a dose not normally prescribed.  Advised pt I would send message to our PA nurse to see if we can get this taken care of.  Pt appreciative for call and all assistance.

## 2018-06-19 NOTE — Telephone Encounter (Signed)
Letter received from [facility] stating that they have approved the pts Amlodipine quantity exception. Approval good until 05/10/2019. I have notified the pts pharmacy.

## 2018-06-27 ENCOUNTER — Telehealth: Payer: Self-pay | Admitting: Interventional Cardiology

## 2018-06-27 NOTE — Telephone Encounter (Signed)
  Pt c/o medication issue:  1. Name of Medication: amLODipine (NORVASC) 5 MG tablet  2. How are you currently taking this medication (dosage and times per day)? Take 1.5 tablets (7.5 mg total) by mouth daily.  3. Are you having a reaction (difficulty breathing--STAT)? No  4. What is your medication issue? Patient states he has been discussing this issue with Maude Leriche. He would like to know where he stands at this point before his script runs out. He stated it was a long and complicated issue.

## 2018-06-27 NOTE — Telephone Encounter (Signed)
Spoke with pt and made him aware that [facility] had received approval for pt to have Amlodipine 1.5 tabs (7.5mg ) through the end of 2020.  Pt verbalized understanding and was appreciative for call.

## 2018-11-07 DIAGNOSIS — H401112 Primary open-angle glaucoma, right eye, moderate stage: Secondary | ICD-10-CM | POA: Diagnosis not present

## 2018-12-14 DIAGNOSIS — M7022 Olecranon bursitis, left elbow: Secondary | ICD-10-CM | POA: Diagnosis not present

## 2018-12-15 ENCOUNTER — Other Ambulatory Visit: Payer: Self-pay | Admitting: Interventional Cardiology

## 2018-12-25 DIAGNOSIS — L820 Inflamed seborrheic keratosis: Secondary | ICD-10-CM | POA: Diagnosis not present

## 2018-12-25 DIAGNOSIS — L570 Actinic keratosis: Secondary | ICD-10-CM | POA: Diagnosis not present

## 2018-12-25 DIAGNOSIS — Z85828 Personal history of other malignant neoplasm of skin: Secondary | ICD-10-CM | POA: Diagnosis not present

## 2018-12-25 DIAGNOSIS — C4441 Basal cell carcinoma of skin of scalp and neck: Secondary | ICD-10-CM | POA: Diagnosis not present

## 2018-12-25 DIAGNOSIS — L603 Nail dystrophy: Secondary | ICD-10-CM | POA: Diagnosis not present

## 2018-12-25 DIAGNOSIS — D485 Neoplasm of uncertain behavior of skin: Secondary | ICD-10-CM | POA: Diagnosis not present

## 2019-02-06 ENCOUNTER — Other Ambulatory Visit: Payer: Self-pay | Admitting: Interventional Cardiology

## 2019-02-13 DIAGNOSIS — Z85828 Personal history of other malignant neoplasm of skin: Secondary | ICD-10-CM | POA: Diagnosis not present

## 2019-02-13 DIAGNOSIS — L570 Actinic keratosis: Secondary | ICD-10-CM | POA: Diagnosis not present

## 2019-02-13 DIAGNOSIS — D1801 Hemangioma of skin and subcutaneous tissue: Secondary | ICD-10-CM | POA: Diagnosis not present

## 2019-02-27 ENCOUNTER — Ambulatory Visit (INDEPENDENT_AMBULATORY_CARE_PROVIDER_SITE_OTHER): Payer: Medicare Other | Admitting: Internal Medicine

## 2019-02-27 ENCOUNTER — Telehealth: Payer: Self-pay | Admitting: Internal Medicine

## 2019-02-27 ENCOUNTER — Ambulatory Visit (INDEPENDENT_AMBULATORY_CARE_PROVIDER_SITE_OTHER): Payer: Medicare Other

## 2019-02-27 ENCOUNTER — Encounter: Payer: Self-pay | Admitting: Internal Medicine

## 2019-02-27 ENCOUNTER — Other Ambulatory Visit: Payer: Self-pay

## 2019-02-27 DIAGNOSIS — J984 Other disorders of lung: Secondary | ICD-10-CM | POA: Diagnosis not present

## 2019-02-27 DIAGNOSIS — R0600 Dyspnea, unspecified: Secondary | ICD-10-CM

## 2019-02-27 DIAGNOSIS — R0609 Other forms of dyspnea: Secondary | ICD-10-CM | POA: Diagnosis not present

## 2019-02-27 DIAGNOSIS — R9389 Abnormal findings on diagnostic imaging of other specified body structures: Secondary | ICD-10-CM | POA: Diagnosis not present

## 2019-02-27 NOTE — Telephone Encounter (Signed)
Received call report from [place] at [facility]. Patient had chest xray today and they wanted to make sure that Dr. Melvyn Novas views the below impression.  Routing to Dr. Melvyn Novas    IMPRESSION: Vague somewhat nodular density in the right upper lobe which may correspond to a density seen on prior CT examination. Noncontrast CT of the chest is recommended for or correlation.  These results will be called to the ordering clinician or representative by the Radiologist Assistant, and communication documented in the PACS or zVision Dashboard.   Electronically Signed   By: Inez Catalina M.D.   On: 02/27/2019 11:21

## 2019-02-27 NOTE — Progress Notes (Signed)
Subjective:   Patient ID: Richard [name], male    DOB: Jun 21, 1934    MRN: [id number]    Brief patient profile: 51 yowm , who was referred by Dr. Lavone Orn  in consultation by DeDios 04/08/16  regarding abnormal chest ct scan c/w MAI and unexplained wt loss  With a 15 PY smoking history, quit in 1965, not been diagnosed with copd or asthma, had LUL lobectomy for Lung CA in 07/2008 by Arlyce Dice s adjuvant rx and released to f/u by Mohammed p 5 y neg surveillance  rec  q 6 m f/u      History of Present Illness  10/26/2016  f/u ov/Richard [name] re: transition of care/ pt new to me  Chief Complaint  Patient presents with  . Follow-up    Breathing good.   no cough/ no sweats/ no fever Appetite poor since original surgery/ baseline wt 160-170  Not limited by breathing from desired activities   rec F/u q 3 m     02/22/2018  f/u ov/Richard [name] re: ? MAI  Chief Complaint  Patient presents with  . Follow-up    6 month follow up. States his breathing has been the same since his last visit. Denies any changes.   Dyspnea:  Just moved furniture from [place] where sold his house/ self propelled mower x one hour/ puts out 60 bales pine straw q spring and rakes up leaves q fall  Cough: none Sleeping: flat/ one pillow SABA use: none  rec No change rx   02/27/2019  f/u ov/Richard [name] re: ? MAI  Chief Complaint  Patient presents with  . Follow-up    Doing well and no co's today.   Dyspnea:  yardwork ok / treadmill 30 min once a week tol ok  Cough: none Sleeping: able to lie flat fine  SABA use: no 02: none  Gaining wt now intentionally    No obvious day to day or daytime variability or assoc excess/ purulent sputum or mucus plugs or hemoptysis or cp or chest tightness, subjective wheeze or overt sinus or hb symptoms.   Sleeping  without nocturnal  or early am exacerbation  of respiratory  c/o's or need for noct saba. Also denies any obvious fluctuation of symptoms with weather or environmental changes or  other aggravating or alleviating factors except as outlined above   No unusual exposure hx or h/o childhood pna/ asthma or knowledge of premature birth.  Current Allergies, Complete Past Medical History, Past Surgical History, Family History, and Social History were reviewed in [employer] [facility] record.  ROS  The following are not active complaints unless bolded Hoarseness, sore throat, dysphagia, dental problems, itching, sneezing,  nasal congestion or discharge of excess mucus or purulent secretions, ear ache,   fever, chills, sweats, unintended wt loss or wt gain, classically pleuritic or exertional cp,  orthopnea pnd or arm/hand swelling  or leg swelling, presyncope, palpitations, abdominal pain, anorexia, nausea, vomiting, diarrhea  or change in bowel habits or change in bladder habits, change in stools or change in urine, dysuria, hematuria,  rash, arthralgias, visual complaints, headache, numbness, weakness or ataxia or problems with walking or coordination,  change in mood or  Memory.      Meds reviewed, no resp rx         Objective:   Physical Exam  Pleasant amb wm nad   02/27/2019       140  02/22/2018      135  08/23/2017  136  02/10/2017        137  10/26/2016        139   04/08/16 141 lb (64 kg)  12/30/15 139 lb 3.2 oz (63.1 kg)  09/24/14 148 lb 12.8 oz (67.5 kg)      HEENT : pt wearing mask not removed for exam due to covid -19 concerns.    NECK :  without JVD/Nodes/TM/ nl carotid upstrokes bilaterally   LUNGS: no acc muscle use,  Nl contour chest which is clear to A and P bilaterally without cough on insp or exp maneuvers   CV:  RRR  no s3 or murmur or increase in P2, and no edema   ABD:  soft and nontender with nl inspiratory excursion in the supine position. No bruits or organomegaly appreciated, bowel sounds nl  MS:  Nl gait/ ext warm without deformities, calf tenderness, cyanosis or clubbing No obvious joint restrictions   SKIN:  warm and dry without lesions    NEURO:  alert, approp, nl sensorium with  no motor or cerebellar deficits apparent.       CXR PA and Lateral:   02/27/2019 :    I personally reviewed images and agree with radiology impression as follows:   Vague somewhat nodular density in the right upper lobe which may correspond to a density seen on prior CT examination. Noncontrast CT of the chest is recommended for or correlation My review:  No def nodule but c/w slt increased markings        Assessment & Plan:

## 2019-02-27 NOTE — Patient Instructions (Addendum)
Please remember to go to the  x-ray department  for your tests - we will call you with the results when they are available     Please schedule a follow up visit in 12 months but call sooner if needed Late add:  F/u changed to 16 m with cxr, sooner if needed

## 2019-02-27 NOTE — Telephone Encounter (Signed)
Aware see result note

## 2019-02-28 ENCOUNTER — Encounter: Payer: Self-pay | Admitting: Internal Medicine

## 2019-02-28 NOTE — Progress Notes (Signed)
appt scheduled for 08/28/19

## 2019-02-28 NOTE — Assessment & Plan Note (Signed)
Resolved to his satisfaction, no further w/u needed.

## 2019-02-28 NOTE — Assessment & Plan Note (Addendum)
CT chest 6/13 /18 1. 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 Overall, there has been mild improvement in the right upper and middle lobe involvement, although there are 2 enlarging focal components in the right upper lobe as well as new clustered nodularity in the left lower lobe. Given the patient's history, continued follow-up should be considered. - ESR 10/26/2016  = 86 with Eos 0.3  Vs   On 02/10/2017  ESR 75 with Eos 0.4  - ESR 02/22/2018 = 65   This is an extremely common benign condition in the elderly and does not warrant aggressive eval/ rx at this point unless there is a clinical correlation suggesting unaddressed pulmonary infection (purulent sputum, night sweats, unintended wt loss, doe) or evolution of  obvious changes on plain cxr (as opposed to serial CT, which is way over sensitive to make clinical decisions re intervention and treatment in the elderly, who tend to tolerate both dx and treatment poorly) .   He is doing great now and cxr looks minimally changed but note radiology concerns so will bring him back in 3 m instead of yearly for f/u cxr, sooner if any new resp sympotms  Discussed in detail all the  indications, usual  risks and alternatives  relative to the benefits with patient who agrees only to  a 6 m f/u which is also very reasonable but I emphasized  that is contingent on continued clinical improvement.  I had an extended discussion with the patient reviewing all relevant studies completed to date and  lasting 15 to 20 minutes of a 25 minute visit    Each maintenance medication was reviewed in detail including most importantly the difference between maintenance and prns and under what circumstances the prns are to be triggered using an action plan format that is not reflected in the computer generated alphabetically organized AVS.     Please see AVS for specific instructions unique to this visit that I personally wrote and verbalized to  the the pt in detail and then reviewed with pt  by my nurse highlighting any  changes in therapy recommended at today's visit to their plan of care.

## 2019-03-07 ENCOUNTER — Other Ambulatory Visit: Payer: Self-pay | Admitting: Interventional Cardiology

## 2019-03-07 MED ORDER — AMLODIPINE BESYLATE 5 MG PO TABS: 5.0000 mg | ORAL_TABLET | Freq: Every day | ORAL | 0 refills | Status: DC

## 2019-03-07 MED ORDER — CLOPIDOGREL BISULFATE 75 MG PO TABS: 75.0000 mg | ORAL_TABLET | Freq: Every day | ORAL | 0 refills | Status: DC

## 2019-03-16 DIAGNOSIS — Z23 Encounter for immunization: Secondary | ICD-10-CM | POA: Diagnosis not present

## 2019-03-28 DIAGNOSIS — Z947 Corneal transplant status: Secondary | ICD-10-CM | POA: Diagnosis not present

## 2019-03-30 DIAGNOSIS — Z8546 Personal history of malignant neoplasm of prostate: Secondary | ICD-10-CM | POA: Diagnosis not present

## 2019-03-30 DIAGNOSIS — Z0000 Encounter for general adult medical examination without abnormal findings: Secondary | ICD-10-CM | POA: Diagnosis not present

## 2019-03-30 DIAGNOSIS — Z23 Encounter for immunization: Secondary | ICD-10-CM | POA: Diagnosis not present

## 2019-03-30 DIAGNOSIS — Z1389 Encounter for screening for other disorder: Secondary | ICD-10-CM | POA: Diagnosis not present

## 2019-03-30 DIAGNOSIS — I10 Essential (primary) hypertension: Secondary | ICD-10-CM | POA: Diagnosis not present

## 2019-03-30 DIAGNOSIS — I2510 Atherosclerotic heart disease of native coronary artery without angina pectoris: Secondary | ICD-10-CM | POA: Diagnosis not present

## 2019-03-30 DIAGNOSIS — E7800 Pure hypercholesterolemia, unspecified: Secondary | ICD-10-CM | POA: Diagnosis not present

## 2019-03-30 DIAGNOSIS — Z85118 Personal history of other malignant neoplasm of bronchus and lung: Secondary | ICD-10-CM | POA: Diagnosis not present

## 2019-04-03 DIAGNOSIS — Z8546 Personal history of malignant neoplasm of prostate: Secondary | ICD-10-CM | POA: Diagnosis not present

## 2019-04-03 DIAGNOSIS — E7800 Pure hypercholesterolemia, unspecified: Secondary | ICD-10-CM | POA: Diagnosis not present

## 2019-04-03 DIAGNOSIS — I10 Essential (primary) hypertension: Secondary | ICD-10-CM | POA: Diagnosis not present

## 2019-04-03 DIAGNOSIS — I2510 Atherosclerotic heart disease of native coronary artery without angina pectoris: Secondary | ICD-10-CM | POA: Diagnosis not present

## 2019-04-03 DIAGNOSIS — Z85118 Personal history of other malignant neoplasm of bronchus and lung: Secondary | ICD-10-CM | POA: Diagnosis not present

## 2019-04-11 NOTE — Progress Notes (Signed)
Cardiology Office Note:    Date:  04/13/2019   ID:  Richard [name], DOB [birth year], MRN [id number]  PCP:  Lavone Orn, MD  Cardiologist:  Sinclair Grooms, MD   Referring MD: Lavone Orn, MD   Chief Complaint  Patient presents with  . Coronary Artery Disease    History of Present Illness:    Richard [name] is a 84 y.o. male with a hx of coronary artery diseasewith known chronic total occlusion of the right coronary collateralized from the left and moderate diagonal disease in 2003, sinus node dysfunction, right bundle branch block, hypertension, and fatigue.    He is not experiencing angina or excessive dyspnea.  He remains active although is more sedentary than in prior years.  He denies orthopnea, PND, and syncope.  He does notice on occasion that he will have palpitations that lasts seconds before resolving.  No associated symptom otherwise.  He denies edema.  Past Medical History:  Diagnosis Date  . Coronary artery disease   . Coronary artery stenosis   . Hypercholesteremia   . Hypertension   . lung ca dx'd 07/2008    Past Surgical History:  Procedure Laterality Date  . bilateral inguinal hernia repair    . CORNEAL TRANSPLANT    . Left video-assisted thoracic surgery, mini-  10/24/2008    Current Medications: Current Meds  Medication Sig  . amLODipine (NORVASC) 5 MG tablet Take 1 tablet (5 mg total) by mouth daily. Please keep upcoming appt in December with Dr. Tamala Julian before anymore refills. Thank you  . atorvastatin (LIPITOR) 80 MG tablet Take 1 tablet (80 mg total) by mouth daily at 6 PM.  . brimonidine (ALPHAGAN P) 0.1 % SOLN Place 2 drops into the left eye daily.   . clopidogrel (PLAVIX) 75 MG tablet Take 1 tablet (75 mg total) by mouth daily. Please keep upcoming appt in December with Dr. Tamala Julian before anymore refills. Thank you  . dorzolamide-timolol (COSOPT) 22.3-6.8 MG/ML ophthalmic solution Place 1 drop into both eyes 2 (two) times daily.   .  Fluorometholone (FML OP) Place 1 drop into both eyes daily.   Marland Kitchen latanoprost (XALATAN) 0.005 % ophthalmic solution Place 1 drop into both eyes at bedtime.   . Multiple Vitamins-Minerals (CENTRUM ADULTS) TABS Take 1 tablet by mouth daily.  Mckinley Jewel Dimesylate (RHOPRESSA) 0.02 % SOLN Apply 1 drop to eye daily.  . nitroGLYCERIN (NITROSTAT) 0.4 MG SL tablet Place 0.4 mg under the tongue every 5 (five) minutes as needed.    . [DISCONTINUED] atorvastatin (LIPITOR) 80 MG tablet Take 1 tablet (80 mg total) by mouth daily at 6 PM. Pt needs to call and make appt before anymore refills. Thanks.     Allergies:   Aspirin   Social History   Socioeconomic History  . Marital status: Married    Spouse name: Not on file  . Number of children: Not on file  . Years of education: Not on file  . Highest education level: Not on file  Occupational History  . Not on file  Social Needs  . Financial resource strain: Not on file  . Food insecurity    Worry: Not on file    Inability: Not on file  . Transportation needs    Medical: Not on file    Non-medical: Not on file  Tobacco Use  . Smoking status: Former Smoker    Quit date: 05/10/1962    Years since quitting: 56.9  . Smokeless tobacco: Never Used  Substance and Sexual Activity  . Alcohol use: Yes  . Drug use: No  . Sexual activity: Not on file  Lifestyle  . Physical activity    Days per week: Not on file    Minutes per session: Not on file  . Stress: Not on file  Relationships  . Social Herbalist on phone: Not on file    Gets together: Not on file    Attends religious service: Not on file    Active member of club or organization: Not on file    Attends meetings of clubs or organizations: Not on file    Relationship status: Not on file  Other Topics Concern  . Not on file  Social History Narrative  . Not on file     Family History: The patient's family history includes Healthy in his brother, mother, and sister; Heart  failure in his father; Rheumatic fever in his father.  ROS:   Please see the history of present illness.    He mows his own grass.  He no longer travels to [place] in the wintertime.  He sold his property there.  The pandemic has restricted his exercise at [facility].  All other systems reviewed and are negative.  EKGs/Labs/Other Studies Reviewed:    The following studies were reviewed today: No new imaging  EKG:  EKG sinus bradycardia, right bundle branch block, nonspecific inferior T wave abnormality.  When compared to October 2019, no change has occurred.  Recent Labs: No results found for requested labs within last 8760 hours.  Recent Lipid Panel    Component Value Date/Time   CHOL 178 03/08/2018 0815   TRIG 50 03/08/2018 0815   HDL 66 03/08/2018 0815   CHOLHDL 2.7 03/08/2018 0815   CHOLHDL 2.2 12/30/2015 1023   VLDL 9 12/30/2015 1023   LDLCALC 102 (H) 03/08/2018 0815    Physical Exam:    VS:  BP (!) 102/48   Pulse (!) 58   Ht 5\' 9"  (1.753 m)   Wt 139 lb 12.8 oz (63.4 kg)   SpO2 96%   BMI 20.64 kg/m     Wt Readings from Last 3 Encounters:  04/13/19 139 lb 12.8 oz (63.4 kg)  02/27/19 140 lb 12.8 oz (63.9 kg)  03/02/18 135 lb 3.2 oz (61.3 kg)     GEN: Slender but healthy appearing. No acute distress HEENT: Normal NECK: No JVD. LYMPHATICS: No lymphadenopathy CARDIAC:  RRR without murmur, gallop, or edema. VASCULAR:  Normal Pulses. No bruits. RESPIRATORY:  Clear to auscultation without rales, wheezing or rhonchi  ABDOMEN: Soft, non-tender, non-distended, No pulsatile mass, MUSCULOSKELETAL: No deformity  SKIN: Warm and dry NEUROLOGIC:  Alert and oriented x 3 PSYCHIATRIC:  Normal affect   ASSESSMENT:    1. Coronary artery disease involving native coronary artery of native heart without angina pectoris   2. Sinoatrial node dysfunction (HCC)   3. Essential hypertension   4. Hyperlipidemia LDL goal <70   5. Right bundle branch block   6.  Educated about COVID-19 virus infection    PLAN:    In order of problems listed above:  1. Secondary prevention discussed in detail as outlined below 2. No bradycardia or syncope 3. Relatively low blood pressure.  At his current age target is now 140/80 mmHg.  I have advised him to decrease amlodipine to 5 mg/day rather than 7.5 mg/day. 4. LDL is above 100.  He is on max dose atorvastatin.  No further intervention  seems prudent at this time. 5. No change. 6. The 3W's is observed to avoid COVID-19 infection.  Overall education and awareness concerning primary/secondary risk prevention was discussed in detail: LDL less than 70, hemoglobin A1c less than 7, blood pressure target less than 130/80 mmHg, >150 minutes of moderate aerobic activity per week, avoidance of smoking, weight control (via diet and exercise), and continued surveillance/management of/for obstructive sleep apnea.  Greater than 50% of the time during this office visit was spent in education, counseling, and coordination of care related to underlying disease process and testing as outlined.    Medication Adjustments/Labs and Tests Ordered: Current medicines are reviewed at length with the patient today.  Concerns regarding medicines are outlined above.  Orders Placed This Encounter  Procedures  . EKG 12-Lead   Meds ordered this encounter  Medications  . atorvastatin (LIPITOR) 80 MG tablet    Sig: Take 1 tablet (80 mg total) by mouth daily at 6 PM.    Dispense:  90 tablet    Refill:  3    There are no Patient Instructions on file for this visit.   Signed, Sinclair Grooms, MD  04/13/2019 9:24 AM    [facility]

## 2019-04-13 ENCOUNTER — Encounter: Payer: Self-pay | Admitting: Interventional Cardiology

## 2019-04-13 ENCOUNTER — Other Ambulatory Visit: Payer: Self-pay

## 2019-04-13 ENCOUNTER — Ambulatory Visit (INDEPENDENT_AMBULATORY_CARE_PROVIDER_SITE_OTHER): Payer: Medicare Other | Admitting: Interventional Cardiology

## 2019-04-13 VITALS — BP 102/48 | HR 58 | Ht 69.0 in | Wt 139.8 lb

## 2019-04-13 DIAGNOSIS — I4510 Unspecified right bundle-branch block: Secondary | ICD-10-CM

## 2019-04-13 DIAGNOSIS — I10 Essential (primary) hypertension: Secondary | ICD-10-CM | POA: Diagnosis not present

## 2019-04-13 DIAGNOSIS — E785 Hyperlipidemia, unspecified: Secondary | ICD-10-CM

## 2019-04-13 DIAGNOSIS — Z7189 Other specified counseling: Secondary | ICD-10-CM

## 2019-04-13 DIAGNOSIS — I2510 Atherosclerotic heart disease of native coronary artery without angina pectoris: Secondary | ICD-10-CM

## 2019-04-13 DIAGNOSIS — I495 Sick sinus syndrome: Secondary | ICD-10-CM | POA: Diagnosis not present

## 2019-04-13 MED ORDER — ATORVASTATIN CALCIUM 80 MG PO TABS: 80.0000 mg | ORAL_TABLET | Freq: Every day | ORAL | 3 refills | Status: DC

## 2019-04-13 NOTE — Patient Instructions (Signed)
Medication Instructions:  1) DECREASE Amlodipine to 5mg  once daily  *If you need a refill on your cardiac medications before your next appointment, please call your pharmacy*  Lab Work: None If you have labs (blood work) drawn today and your tests are completely normal, you will receive your results only by: Marland Kitchen MyChart Message (if you have MyChart) OR . A paper copy in the mail If you have any lab test that is abnormal or we need to change your treatment, we will call you to review the results.  Testing/Procedures: None  Follow-Up: At [facility], you and your health needs are our priority.  As part of our continuing mission to provide you with exceptional heart care, we have created designated Provider Care Teams.  These Care Teams include your primary Cardiologist (physician) and Advanced Practice Providers (APPs -  Physician Assistants and Nurse Practitioners) who all work together to provide you with the care you need, when you need it.  Your next appointment:   12 month(s)  The format for your next appointment:   In Person  Provider:   You may see Sinclair Grooms, MD or one of the following Advanced Practice Providers on your designated Care Team:    Truitt Merle, NP  Cecilie Kicks, NP  Kathyrn Drown, NP   Other Instructions  Your goal blood pressure is 140/80 or less.  If you consistently see your blood pressure running 150/80 or higher, please contact the office.

## 2019-04-23 ENCOUNTER — Telehealth: Payer: Self-pay | Admitting: Interventional Cardiology

## 2019-04-23 NOTE — Telephone Encounter (Signed)
Patient called wanting to tell Dr.Smith he would like to take the COVID vaccine when it is given.

## 2019-04-24 NOTE — Telephone Encounter (Signed)
Spoke with pt and he just wanted to let us know, if we get the vaccine, he would like to get one.  Advised pt as of right now we do not know if we will be getting the vaccine and reaching out to PCP is probably best bet.  Pt states he has already done that and was just covering all bases by calling us.  Pt appreciative for call.

## 2019-05-23 ENCOUNTER — Other Ambulatory Visit: Payer: Self-pay | Admitting: Interventional Cardiology

## 2019-05-29 DIAGNOSIS — Z947 Corneal transplant status: Secondary | ICD-10-CM | POA: Diagnosis not present

## 2019-05-29 DIAGNOSIS — H4089 Other specified glaucoma: Secondary | ICD-10-CM | POA: Diagnosis not present

## 2019-05-29 DIAGNOSIS — H401112 Primary open-angle glaucoma, right eye, moderate stage: Secondary | ICD-10-CM | POA: Diagnosis not present

## 2019-05-30 ENCOUNTER — Other Ambulatory Visit: Payer: Medicare Other

## 2019-05-30 ENCOUNTER — Ambulatory Visit: Payer: Medicare Other | Attending: Internal Medicine

## 2019-05-30 DIAGNOSIS — Z23 Encounter for immunization: Secondary | ICD-10-CM | POA: Insufficient documentation

## 2019-05-30 NOTE — Progress Notes (Signed)
   Covid-19 Vaccination Clinic  Name:  Richard [name]    MRN: [id number] DOB: [birth year]  05/30/2019  Richard [name] was observed post Covid-19 immunization for 15 minutes without incidence. He was provided with Vaccine Information Sheet and instruction to access the V-Safe system.   Richard [name] was instructed to call 911 with any severe reactions post vaccine: Marland Kitchen Difficulty breathing  . Swelling of your face and throat  . A fast heartbeat  . A bad rash all over your body  . Dizziness and weakness    Immunizations Administered    Name Date Dose VIS Date Route   Pfizer COVID-19 Vaccine 05/30/2019  2:16 PM 0.3 mL 04/20/2019 Intramuscular   Manufacturer: [place]   Lot: [id number]   [place]: [id number]

## 2019-06-04 ENCOUNTER — Other Ambulatory Visit: Payer: Self-pay | Admitting: *Deleted

## 2019-06-04 MED ORDER — CLOPIDOGREL BISULFATE 75 MG PO TABS: 75.0000 mg | ORAL_TABLET | Freq: Every day | ORAL | 2 refills | Status: DC

## 2019-06-09 ENCOUNTER — Ambulatory Visit: Payer: Medicare Other

## 2019-06-19 ENCOUNTER — Ambulatory Visit: Payer: Medicare Other | Attending: Internal Medicine

## 2019-06-19 DIAGNOSIS — Z23 Encounter for immunization: Secondary | ICD-10-CM | POA: Insufficient documentation

## 2019-06-19 NOTE — Progress Notes (Signed)
   Covid-19 Vaccination Clinic  Name:  Richard [name]    MRN: [id number] DOB: 1935/03/01  06/19/2019  [name] was observed post Covid-19 immunization for 15 minutes without incidence. He was provided with Vaccine Information Sheet and instruction to access the V-Safe system.   [name] was instructed to call 911 with any severe reactions post vaccine: Marland Kitchen Difficulty breathing  . Swelling of your face and throat  . A fast heartbeat  . A bad rash all over your body  . Dizziness and weakness    Immunizations Administered    Name Date Dose VIS Date Route   Pfizer COVID-19 Vaccine 06/19/2019 12:08 PM 0.3 mL 04/20/2019 Intramuscular   Manufacturer: [place]   Lot: [id number]   [place]: [id number]

## 2019-07-26 DIAGNOSIS — Z947 Corneal transplant status: Secondary | ICD-10-CM | POA: Diagnosis not present

## 2019-07-26 DIAGNOSIS — T868409 Corneal transplant rejection, unspecified eye: Secondary | ICD-10-CM | POA: Diagnosis not present

## 2019-07-27 DIAGNOSIS — Z947 Corneal transplant status: Secondary | ICD-10-CM | POA: Diagnosis not present

## 2019-07-27 DIAGNOSIS — H5712 Ocular pain, left eye: Secondary | ICD-10-CM | POA: Diagnosis not present

## 2019-07-27 DIAGNOSIS — T868409 Corneal transplant rejection, unspecified eye: Secondary | ICD-10-CM | POA: Diagnosis not present

## 2019-07-30 DIAGNOSIS — H18232 Secondary corneal edema, left eye: Secondary | ICD-10-CM | POA: Diagnosis not present

## 2019-07-30 DIAGNOSIS — T1502XA Foreign body in cornea, left eye, initial encounter: Secondary | ICD-10-CM | POA: Diagnosis not present

## 2019-07-30 DIAGNOSIS — T868409 Corneal transplant rejection, unspecified eye: Secondary | ICD-10-CM | POA: Diagnosis not present

## 2019-08-01 DIAGNOSIS — H18519 Endothelial corneal dystrophy, unspecified eye: Secondary | ICD-10-CM | POA: Diagnosis not present

## 2019-08-28 ENCOUNTER — Ambulatory Visit: Payer: Medicare Other | Admitting: Internal Medicine

## 2019-09-04 ENCOUNTER — Other Ambulatory Visit: Payer: Self-pay

## 2019-09-04 DIAGNOSIS — H18519 Endothelial corneal dystrophy, unspecified eye: Secondary | ICD-10-CM | POA: Diagnosis not present

## 2019-09-04 DIAGNOSIS — Z947 Corneal transplant status: Secondary | ICD-10-CM | POA: Diagnosis not present

## 2019-09-04 DIAGNOSIS — H4089 Other specified glaucoma: Secondary | ICD-10-CM | POA: Diagnosis not present

## 2019-09-04 MED ORDER — AMLODIPINE BESYLATE 5 MG PO TABS: 5.0000 mg | ORAL_TABLET | Freq: Every day | ORAL | 2 refills | Status: DC

## 2019-09-20 ENCOUNTER — Other Ambulatory Visit: Payer: Self-pay | Admitting: Internal Medicine

## 2019-09-20 DIAGNOSIS — R9389 Abnormal findings on diagnostic imaging of other specified body structures: Secondary | ICD-10-CM

## 2019-09-21 ENCOUNTER — Ambulatory Visit (INDEPENDENT_AMBULATORY_CARE_PROVIDER_SITE_OTHER): Payer: Medicare Other | Admitting: Internal Medicine

## 2019-09-21 ENCOUNTER — Encounter: Payer: Self-pay | Admitting: Internal Medicine

## 2019-09-21 ENCOUNTER — Ambulatory Visit (INDEPENDENT_AMBULATORY_CARE_PROVIDER_SITE_OTHER): Payer: Medicare Other

## 2019-09-21 ENCOUNTER — Other Ambulatory Visit: Payer: Self-pay

## 2019-09-21 DIAGNOSIS — R9389 Abnormal findings on diagnostic imaging of other specified body structures: Secondary | ICD-10-CM

## 2019-09-21 DIAGNOSIS — R918 Other nonspecific abnormal finding of lung field: Secondary | ICD-10-CM | POA: Diagnosis not present

## 2019-09-21 NOTE — Progress Notes (Signed)
Subjective:   Patient ID: Richard [name], male    DOB: 09-Mar-1935    MRN: [id number]    Brief patient profile: 70 yowm , who was referred by Dr. Lavone Orn  in consultation by DeDios 04/08/16  regarding abnormal chest ct scan c/w MAI and unexplained wt loss  With a 15 PY smoking history, quit in 1965, not been diagnosed with copd or asthma, had LUL lobectomy for Lung CA in 07/2008 by Arlyce Dice s adjuvant rx and released to f/u by Mohammed p 5 y neg surveillance  rec  q 6 m f/u      History of Present Illness  10/26/2016  f/u ov/Parminder Trapani re: transition of care/ pt new to me  Chief Complaint  Patient presents with  . Follow-up    Breathing good.   no cough/ no sweats/ no fever Appetite poor since original surgery/ baseline wt 160-170  Not limited by breathing from desired activities   rec F/u q 3 m        02/27/2019  f/u ov/Sabryn Preslar re: ? MAI  Chief Complaint  Patient presents with  . Follow-up    Doing well and no co's today.   Dyspnea:  yardwork ok / treadmill 30 min once a week tol ok  Cough: none Sleeping: able to lie flat fine  SABA use: no 02: none  Gaining wt now intentionally  rec F/u 6 m    09/21/2019  f/u ov/Kimiko Common re:  ? MAI / got covid  shots  Chief Complaint  Patient presents with  . Follow-up    Breathing is doing well. CXR repeated today.    Dyspnea:  Still doing yardwork Cough: none Sleeping: no resp symptoms SABA use: none 02: none     No obvious day to day or daytime variability or assoc excess/ purulent sputum or mucus plugs or hemoptysis or cp or chest tightness, subjective wheeze or overt sinus or hb symptoms.   Sleeping  without nocturnal  or early am exacerbation  of respiratory  c/o's or need for noct saba. Also denies any obvious fluctuation of symptoms with weather or environmental changes or other aggravating or alleviating factors except as outlined above   No unusual exposure hx or h/o childhood pna/ asthma or knowledge of premature  birth.  Current Allergies, Complete Past Medical History, Past Surgical History, Family History, and Social History were reviewed in [employer] record.  ROS  The following are not active complaints unless bolded Hoarseness, sore throat, dysphagia, dental problems, itching, sneezing,  nasal congestion or discharge of excess mucus or purulent secretions, ear ache,   fever, chills, sweats, unintended wt loss= stabilized/improved or wt gain, classically pleuritic or exertional cp,  orthopnea pnd or arm/hand swelling  or leg swelling, presyncope, palpitations, abdominal pain, anorexia, nausea, vomiting, diarrhea  or change in bowel habits or change in bladder habits, change in stools or change in urine, dysuria, hematuria,  rash, arthralgias, visual complaints, headache, numbness, weakness or ataxia or problems with walking or coordination,  change in mood or  memory.        Current Meds  Medication Sig  . amLODipine (NORVASC) 5 MG tablet Take 1 tablet (5 mg total) by mouth daily.  Marland Kitchen atorvastatin (LIPITOR) 80 MG tablet Take 1 tablet (80 mg total) by mouth daily at 6 PM.  . brimonidine (ALPHAGAN P) 0.1 % SOLN Place 2 drops into the left eye daily.   . clopidogrel (PLAVIX) 75 MG tablet Take 1 tablet (  75 mg total) by mouth daily.  . dorzolamide-timolol (COSOPT) 22.3-6.8 MG/ML ophthalmic solution Place 1 drop into both eyes 2 (two) times daily.   . Fluorometholone (FML OP) Place 1 drop into both eyes daily.   Marland Kitchen latanoprost (XALATAN) 0.005 % ophthalmic solution Place 1 drop into both eyes at bedtime.   . Multiple Vitamins-Minerals (CENTRUM ADULTS) TABS Take 1 tablet by mouth daily.  Mckinley Jewel Dimesylate (RHOPRESSA) 0.02 % SOLN Apply 1 drop to eye daily.  . nitroGLYCERIN (NITROSTAT) 0.4 MG SL tablet Place 0.4 mg under the tongue every 5 (five) minutes as needed.                   Objective:   Physical Exam    09/21/2019         142  02/27/2019       140  02/22/2018       135  08/23/2017        136  02/10/2017        137  10/26/2016        139   04/08/16 141 lb (64 kg)  12/30/15 139 lb 3.2 oz (63.1 kg)  09/24/14 148 lb 12.8 oz (67.5 kg)      Vital signs reviewed  09/21/2019  - Note at rest 02 sats  98% on RA  Robust amb wm nad   HEENT : pt wearing mask not removed for exam due to covid -19 concerns.    NECK :  without JVD/Nodes/TM/ nl carotid upstrokes bilaterally   LUNGS: no acc muscle use,  Nl contour chest which is clear to A and P bilaterally without cough on insp or exp maneuvers   CV:  RRR  no s3 or murmur or increase in P2, and no edema   ABD:  soft and nontender with nl inspiratory excursion in the supine position. No bruits or organomegaly appreciated, bowel sounds nl  MS:  Nl gait/ ext warm without deformities, calf tenderness, cyanosis or clubbing No obvious joint restrictions   SKIN: warm and dry without lesions    NEURO:  alert, approp, nl sensorium with  no motor or cerebellar deficits apparent.    CXR PA and Lateral:   09/21/2019 :    I personally reviewed images and agree with radiology impression as follows:  No significant change since October of last year. Pulmonary scarring. Multiple small nodular shadows within both lungs. No evidence of progression or new abnormality.              Assessment & Plan:

## 2019-09-21 NOTE — Patient Instructions (Signed)
No change in recommendations   Please schedule a follow up visit in 12 months but call sooner if needed with cxr on return

## 2019-09-23 ENCOUNTER — Encounter: Payer: Self-pay | Admitting: Internal Medicine

## 2019-09-23 NOTE — Assessment & Plan Note (Signed)
CT chest 6/13 /18 1. 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 Overall, there has been mild improvement in the right upper and middle lobe involvement, although there are 2 enlarging focal components in the right upper lobe as well as new clustered nodularity in the left lower lobe. Given the patient's history, continued follow-up should be considered. - ESR 10/26/2016  = 86 with Eos 0.3  Vs   On 02/10/2017  ESR 75 with Eos 0.4  - ESR 02/22/2018 = 65  - 09/21/2019 cxr s changes from priors/ wt loss improved   Again, This is an extremely common benign condition in the elderly and does not warrant aggressive eval/ rx at this point unless there is a clinical correlation suggesting unaddressed pulmonary infection (purulent sputum, night sweats, unintended wt loss, doe) or evolution of  obvious changes on plain cxr (as opposed to serial CT, which is way over sensitive to make clinical decisions re intervention and treatment in the elderly, who tend to tolerate both dx and treatment poorly) .   rec yearly cxr's - call sooner if needed          Each maintenance medication was reviewed in detail including emphasizing most importantly the difference between maintenance and prns and under what circumstances the prns are to be triggered using an action plan format where appropriate.  Total time for H and P, chart review, counseling,   and generating customized AVS unique to this office visit / charting = 20 min

## 2019-09-24 NOTE — Progress Notes (Signed)
Spoke with pt and notified of results per Dr. Wert. Pt verbalized understanding and denied any questions. 

## 2019-11-22 DIAGNOSIS — I2510 Atherosclerotic heart disease of native coronary artery without angina pectoris: Secondary | ICD-10-CM | POA: Diagnosis not present

## 2019-11-22 DIAGNOSIS — E7800 Pure hypercholesterolemia, unspecified: Secondary | ICD-10-CM | POA: Diagnosis not present

## 2019-11-22 DIAGNOSIS — Z85118 Personal history of other malignant neoplasm of bronchus and lung: Secondary | ICD-10-CM | POA: Diagnosis not present

## 2019-11-22 DIAGNOSIS — Z8546 Personal history of malignant neoplasm of prostate: Secondary | ICD-10-CM | POA: Diagnosis not present

## 2019-11-22 DIAGNOSIS — I10 Essential (primary) hypertension: Secondary | ICD-10-CM | POA: Diagnosis not present

## 2019-12-07 ENCOUNTER — Other Ambulatory Visit: Payer: Self-pay

## 2019-12-07 ENCOUNTER — Encounter (HOSPITAL_COMMUNITY): Payer: Self-pay

## 2019-12-07 ENCOUNTER — Ambulatory Visit (HOSPITAL_COMMUNITY)
Admission: EM | Admit: 2019-12-07 | Discharge: 2019-12-07 | Disposition: A | Discharge status: Home / Self Care | Payer: Medicare Other | Attending: Emergency Medicine | Admitting: Emergency Medicine

## 2019-12-07 DIAGNOSIS — Z20822 Contact with and (suspected) exposure to covid-19: Secondary | ICD-10-CM | POA: Insufficient documentation

## 2019-12-07 LAB — SARS CORONAVIRUS 2 (TAT 6-24 HRS): SARS Coronavirus 2: NEGATIVE

## 2019-12-07 NOTE — ED Triage Notes (Signed)
Pt states he was exposed to COVID positive person last Friday. Pt states he has chronic congestion/runny nose 2/2 allergies that have not increased in severity. Chronic/intermittent diarrhea that has not changed. Denies other URI sx, cough, SOB, body aches, fever, chills.

## 2019-12-07 NOTE — Discharge Instructions (Signed)
We will notify of you any positive findings or if any changes to treatment are needed. If normal or otherwise without concern to your results, we will not call you. Please log on to your MyChart to review your results if interested in so.

## 2019-12-08 NOTE — ED Provider Notes (Signed)
[place]    CSN: [id number] Arrival date & time: 12/07/19  0805      History   Chief Complaint Chief Complaint  Patient presents with  . covid exposure    HPI Richard [name] is a 85 y.o. male.   Magdalene Patricia presents with requests for covid-19 screening. His son just tested positive, who had been to visit for the weekend. Around his son last on 7/26, just before he tested positive. Denies any symptoms and feels well. Has been vaccinated.    ROS per HPI, negative if not otherwise mentioned.      Past Medical History:  Diagnosis Date  . Coronary artery disease   . Coronary artery stenosis   . Hypercholesteremia   . Hypertension   . lung ca dx'd 07/2008    Patient Active Problem List   Diagnosis Date Noted  . DOE (dyspnea on exertion) 02/10/2017  . Abnormal CT scan, chest 04/08/2016  . COPD (chronic obstructive pulmonary disease) ([place]) 04/08/2016  . CAD (coronary artery disease), native coronary artery 08/30/2013  . Sinoatrial node dysfunction ([place]) 08/30/2013  . Right bundle branch block 08/30/2013  . Essential hypertension 08/30/2013  . COUGH 11/12/2008  . HYPOXEMIA 11/12/2008  . CARCINOMA, LUNG, NONSMALL CELL 11/08/2008  . Hyperlipidemia LDL goal <70 11/08/2008    Past Surgical History:  Procedure Laterality Date  . bilateral inguinal hernia repair    . CORNEAL TRANSPLANT    . Left video-assisted thoracic surgery, mini-  10/24/2008       Home Medications    Prior to Admission medications   Medication Sig Start Date End Date Taking? Authorizing Provider  amLODipine (NORVASC) 5 MG tablet Take 1 tablet (5 mg total) by mouth daily. 09/04/19  Yes Belva Crome, MD  atorvastatin (LIPITOR) 80 MG tablet Take 1 tablet (80 mg total) by mouth daily at 6 PM. 05/23/19  Yes Belva Crome, MD  clopidogrel (PLAVIX) 75 MG tablet Take 1 tablet (75 mg total) by mouth daily. 06/04/19  Yes Belva Crome, MD  brimonidine (ALPHAGAN P) 0.1 % SOLN Place 2 drops  into the left eye daily.     [provider]  dorzolamide-timolol (COSOPT) 22.3-6.8 MG/ML ophthalmic solution Place 1 drop into both eyes 2 (two) times daily.     [provider]  Fluorometholone (FML OP) Place 1 drop into both eyes daily.     [provider]  latanoprost (XALATAN) 0.005 % ophthalmic solution Place 1 drop into both eyes at bedtime.  06/30/11   [provider]  Multiple Vitamins-Minerals (CENTRUM ADULTS) TABS Take 1 tablet by mouth daily.    [provider]  Netarsudil Dimesylate (RHOPRESSA) 0.02 % SOLN Apply 1 drop to eye daily. 04/19/17   [provider]  nitroGLYCERIN (NITROSTAT) 0.4 MG SL tablet Place 0.4 mg under the tongue every 5 (five) minutes as needed.      [provider]    Family History Family History  Problem Relation Age of Onset  . Healthy Mother   . Heart failure Father   . Rheumatic fever Father   . Healthy Sister   . Healthy Brother     Social History Social History   Tobacco Use  . Smoking status: Former Smoker    Quit date: 05/10/1962    Years since quitting: 57.6  . Smokeless tobacco: Never Used  Vaping Use  . Vaping Use: Never used  Substance Use Topics  . Alcohol use: Yes  .  Drug use: No     Allergies   Aspirin   Review of Systems Review of Systems   Physical Exam Triage Vital Signs ED Triage Vitals  Enc Vitals Group     BP 12/07/19 0840 (!) 142/57     Pulse Rate 12/07/19 0840 66     Resp 12/07/19 0840 18     Temp 12/07/19 0839 98.5 F (36.9 C)     Temp Source 12/07/19 0839 Oral     SpO2 12/07/19 0840 100 %     Weight --      Height --      Head Circumference --      Peak Flow --      Pain Score 12/07/19 0840 0     Pain Loc --      Pain Edu? --      Excl. in [place]? --    No data found.  Updated Vital Signs BP (!) 142/57 (BP Location: Left Arm)   Pulse 66   Temp 98.5 F (36.9 C) (Oral)   Resp 18   SpO2 100%   Visual Acuity Right Eye Distance:     Left Eye Distance:   Bilateral Distance:    Right Eye Near:   Left Eye Near:    Bilateral Near:     Physical Exam Constitutional:      Appearance: He is well-developed.  Cardiovascular:     Rate and Rhythm: Normal rate.  Pulmonary:     Effort: Pulmonary effort is normal.  Skin:    General: Skin is warm and dry.  Neurological:     Mental Status: He is alert and oriented to person, place, and time.      UC Treatments / Results  Labs (all labs ordered are listed, but only abnormal results are displayed) Labs Reviewed  SARS CORONAVIRUS 2 (TAT 6-24 HRS)    EKG   Radiology No results found.  Procedures Procedures (including critical care time)  Medications Ordered in UC Medications - No data to display  Initial Impression / Assessment and Plan / UC Course  I have reviewed the triage vital signs and the nursing notes.  Pertinent labs & imaging results that were available during my care of the patient were reviewed by me and considered in my medical decision making (see chart for details).     Will call if positive. covid screening for exposure, no symptoms.  Final Clinical Impressions(s) / UC Diagnoses   Final diagnoses:  Exposure to COVID-19 virus  Encounter for laboratory testing for COVID-19 virus     Discharge Instructions     We will notify of you any positive findings or if any changes to treatment are needed. If normal or otherwise without concern to your results, we will not call you. Please log on to your MyChart to review your results if interested in so.     ED Prescriptions    None     PDMP not reviewed this encounter.   Zigmund Gottron, NP 12/08/19 [phone]

## 2020-01-10 DIAGNOSIS — H18519 Endothelial corneal dystrophy, unspecified eye: Secondary | ICD-10-CM | POA: Diagnosis not present

## 2020-01-10 DIAGNOSIS — H4051X1 Glaucoma secondary to other eye disorders, right eye, mild stage: Secondary | ICD-10-CM | POA: Diagnosis not present

## 2020-01-10 DIAGNOSIS — Z947 Corneal transplant status: Secondary | ICD-10-CM | POA: Diagnosis not present

## 2020-01-10 DIAGNOSIS — H1820 Unspecified corneal edema: Secondary | ICD-10-CM | POA: Diagnosis not present

## 2020-01-10 DIAGNOSIS — H401122 Primary open-angle glaucoma, left eye, moderate stage: Secondary | ICD-10-CM | POA: Diagnosis not present

## 2020-01-18 DIAGNOSIS — Z23 Encounter for immunization: Secondary | ICD-10-CM | POA: Diagnosis not present

## 2020-02-07 DIAGNOSIS — E7800 Pure hypercholesterolemia, unspecified: Secondary | ICD-10-CM | POA: Diagnosis not present

## 2020-02-07 DIAGNOSIS — Z85118 Personal history of other malignant neoplasm of bronchus and lung: Secondary | ICD-10-CM | POA: Diagnosis not present

## 2020-02-07 DIAGNOSIS — I2510 Atherosclerotic heart disease of native coronary artery without angina pectoris: Secondary | ICD-10-CM | POA: Diagnosis not present

## 2020-02-07 DIAGNOSIS — Z8546 Personal history of malignant neoplasm of prostate: Secondary | ICD-10-CM | POA: Diagnosis not present

## 2020-02-07 DIAGNOSIS — I10 Essential (primary) hypertension: Secondary | ICD-10-CM | POA: Diagnosis not present

## 2020-02-28 ENCOUNTER — Other Ambulatory Visit: Payer: Self-pay | Admitting: Interventional Cardiology

## 2020-02-29 ENCOUNTER — Ambulatory Visit: Payer: Medicare Other | Admitting: Internal Medicine

## 2020-03-04 ENCOUNTER — Other Ambulatory Visit: Payer: Self-pay

## 2020-03-04 ENCOUNTER — Ambulatory Visit (INDEPENDENT_AMBULATORY_CARE_PROVIDER_SITE_OTHER): Payer: Medicare Other | Admitting: Internal Medicine

## 2020-03-04 ENCOUNTER — Ambulatory Visit (INDEPENDENT_AMBULATORY_CARE_PROVIDER_SITE_OTHER): Payer: Medicare Other

## 2020-03-04 ENCOUNTER — Encounter: Payer: Self-pay | Admitting: Internal Medicine

## 2020-03-04 DIAGNOSIS — R9389 Abnormal findings on diagnostic imaging of other specified body structures: Secondary | ICD-10-CM | POA: Diagnosis not present

## 2020-03-04 DIAGNOSIS — R918 Other nonspecific abnormal finding of lung field: Secondary | ICD-10-CM | POA: Diagnosis not present

## 2020-03-04 DIAGNOSIS — M47814 Spondylosis without myelopathy or radiculopathy, thoracic region: Secondary | ICD-10-CM | POA: Diagnosis not present

## 2020-03-04 DIAGNOSIS — D71 Functional disorders of polymorphonuclear neutrophils: Secondary | ICD-10-CM | POA: Diagnosis not present

## 2020-03-04 NOTE — Progress Notes (Signed)
Subjective:   Patient ID: Richard [name], male    DOB: December 29, 1934    MRN: [id number]    Brief patient profile: 45 yowm , who was referred by Dr. Lavone Orn  in consultation by DeDios 04/08/16  regarding abnormal chest ct scan c/w MAI and unexplained wt loss  With a 15 PY smoking history, quit in 1965, not been diagnosed with copd or asthma, had LUL lobectomy for Lung CA in 07/2008 by Arlyce Dice s adjuvant rx and released to f/u by Mohammed p 5 y neg surveillance  rec  q 6 m f/u      History of Present Illness  10/26/2016  f/u ov/Richard [name] re: transition of care/ pt new to me  Chief Complaint  Patient presents with  . Follow-up    Breathing good.   no cough/ no sweats/ no fever Appetite poor since original surgery/ baseline wt 160-170  Not limited by breathing from desired activities   rec F/u q 3 m        02/27/2019  f/u ov/Richard [name] re: ? MAI  Chief Complaint  Patient presents with  . Follow-up    Doing well and no co's today.   Dyspnea:  yardwork ok / treadmill 30 min once a week tol ok  Cough: none Sleeping: able to lie flat fine  SABA use: no 02: none  Gaining wt now intentionally  rec F/u 6 m    09/21/2019  f/u ov/Richard [name] re:  ? MAI / got covid  shots  Chief Complaint  Patient presents with  . Follow-up    Breathing is doing well. CXR repeated today.    Dyspnea:  Still doing yardwork Cough: none Sleeping: no resp symptoms SABA use: none 02: none  rec No change rx    03/04/2020  f/u ov/Richard [name] re:  ? MAI  Chief Complaint  Patient presents with  . Follow-up    DOE, Lung nodule, doing well, no new issues   Dyspnea:  Not limited by breathing from desired activities  / yardwork ok but  no powerwalking  Cough: none Sleeping: bed is flat / one pillow SABA use: none 02: none    No obvious day to day or daytime variability or assoc excess/ purulent sputum or mucus plugs or hemoptysis or cp or chest tightness, subjective wheeze or overt sinus or hb symptoms.   Sleeping  as above without nocturnal  or early am exacerbation  of respiratory  c/o's or need for noct saba. Also denies any obvious fluctuation of symptoms with weather or environmental changes or other aggravating or alleviating factors except as outlined above   No unusual exposure hx or h/o childhood pna/ asthma or knowledge of premature birth.  Current Allergies, Complete Past Medical History, Past Surgical History, Family History, and Social History were reviewed in [employer] record.  ROS  The following are not active complaints unless bolded Hoarseness, sore throat, dysphagia, dental problems, itching, sneezing,  nasal congestion or discharge of excess mucus or purulent secretions, ear ache,   fever, chills, sweats, unintended wt loss or wt gain, classically pleuritic or exertional cp,  orthopnea pnd or arm/hand swelling  or leg swelling, presyncope, palpitations, abdominal pain, anorexia, nausea, vomiting, diarrhea  or change in bowel habits or change in bladder habits, change in stools or change in urine, dysuria, hematuria,  rash, arthralgias, visual complaints, headache, numbness, weakness or ataxia or problems with walking or coordination,  change in mood or  Memory.    Current  Meds  Medication Sig  . amLODipine (NORVASC) 5 MG tablet Take 1 tablet (5 mg total) by mouth daily.  Marland Kitchen atorvastatin (LIPITOR) 80 MG tablet Take 1 tablet (80 mg total) by mouth daily at 6 PM.  . brimonidine (ALPHAGAN P) 0.1 % SOLN Place 2 drops into the left eye daily.   . clopidogrel (PLAVIX) 75 MG tablet Take 1 tablet (75 mg total) by mouth daily. Please keep upcoming appt in January 2022 with Dr. Tamala Julian for future refills. Thank you  . dorzolamide-timolol (COSOPT) 22.3-6.8 MG/ML ophthalmic solution Place 1 drop into both eyes 2 (two) times daily.   . Fluorometholone (FML OP) Place 1 drop into both eyes daily.   Marland Kitchen latanoprost (XALATAN) 0.005 % ophthalmic solution Place 1 drop into both eyes at  bedtime.   . Multiple Vitamins-Minerals (CENTRUM ADULTS) TABS Take 1 tablet by mouth daily.  Mckinley Jewel Dimesylate (RHOPRESSA) 0.02 % SOLN Apply 1 drop to eye daily. Apply to left eye at bedtime  . nitroGLYCERIN (NITROSTAT) 0.4 MG SL tablet Place 0.4 mg under the tongue every 5 (five) minutes as needed.                     Objective:   Physical Exam    03/04/2020       138  09/21/2019         142  02/27/2019       140  02/22/2018      135  08/23/2017        136  02/10/2017        137  10/26/2016        139   04/08/16 141 lb (64 kg)  12/30/15 139 lb 3.2 oz (63.1 kg)  09/24/14 148 lb 12.8 oz (67.5 kg)   Vital signs reviewed  03/04/2020  - Note at rest 02 sats  97% on RA  slt hoarse pleasant amb wm nad    HEENT : pt wearing mask not removed for exam due to covid -19 concerns.    NECK :  without JVD/Nodes/TM/ nl carotid upstrokes bilaterally   LUNGS: no acc muscle use,  Nl contour chest which is clear to A and P bilaterally without cough on insp or exp maneuvers   CV:  RRR  no s3 or murmur or increase in P2, and no edema   ABD:  soft and nontender with nl inspiratory excursion in the supine position. No bruits or organomegaly appreciated, bowel sounds nl  MS:  Nl gait/ ext warm without deformities, calf tenderness, cyanosis or clubbing No obvious joint restrictions   SKIN: warm and dry without lesions    NEURO:  alert, approp, nl sensorium with  no motor or cerebellar deficits apparent.            CXR PA and Lateral:   03/04/2020 :    I personally reviewed images and   impression as follows:   Minimal change non specific markings vs priors           Assessment & Plan:

## 2020-03-04 NOTE — Patient Instructions (Signed)
Please remember to go to the x-ray department   for your tests - we will call you with the results when they are available.     Please schedule a follow up visit in  12 months but call sooner if needed.    

## 2020-03-05 ENCOUNTER — Encounter: Payer: Self-pay | Admitting: Internal Medicine

## 2020-03-05 NOTE — Assessment & Plan Note (Signed)
CT chest 6/13 /18 1. 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 Overall, there has been mild improvement in the right upper and middle lobe involvement, although there are 2 enlarging focal components in the right upper lobe as well as new clustered nodularity in the left lower lobe. Given the patient's history, continued follow-up should be considered. - ESR 10/26/2016  = 86 with Eos 0.3  Vs   On 02/10/2017  ESR 75 with Eos 0.4  - ESR 02/22/2018 = 65  - 09/21/2019 cxr s changes from priors/ wt loss improved   His poor appetite and wt loss have varied but his ex tol remains baseline and he has no cough so I'm inclined to follow him conservatively with other option to go ahead with BAL to see if colonized with MAI as I suspect.  This is an extremely common benign condition in the elderly and does not warrant aggressive eval/ rx at this point unless there is a clinical correlation suggesting unaddressed pulmonary infection (purulent sputum, night sweats, unintended wt loss, doe) or evolution of  obvious changes on plain cxr (as opposed to serial CT, which is way over sensitive to make clinical decisions re intervention and treatment in the elderly, who tend to tolerate both dx and treatment poorly) .   Discussed in detail all the  indications, usual  risks and alternatives  relative to the benefits with patient who agrees to proceed with conservative f/u as outlined           Each maintenance medication was reviewed in detail including emphasizing most importantly the difference between maintenance and prns and under what circumstances the prns are to be triggered using an action plan format where appropriate.  Total time for H and P, chart review, counseling, teaching device and generating customized AVS unique to this office visit / charting = 15 min

## 2020-04-10 DIAGNOSIS — I10 Essential (primary) hypertension: Secondary | ICD-10-CM | POA: Diagnosis not present

## 2020-04-10 DIAGNOSIS — A310 Pulmonary mycobacterial infection: Secondary | ICD-10-CM | POA: Diagnosis not present

## 2020-04-10 DIAGNOSIS — E7800 Pure hypercholesterolemia, unspecified: Secondary | ICD-10-CM | POA: Diagnosis not present

## 2020-04-10 DIAGNOSIS — Z0000 Encounter for general adult medical examination without abnormal findings: Secondary | ICD-10-CM | POA: Diagnosis not present

## 2020-04-10 DIAGNOSIS — H9313 Tinnitus, bilateral: Secondary | ICD-10-CM | POA: Diagnosis not present

## 2020-04-10 DIAGNOSIS — Z85118 Personal history of other malignant neoplasm of bronchus and lung: Secondary | ICD-10-CM | POA: Diagnosis not present

## 2020-04-10 DIAGNOSIS — Z1389 Encounter for screening for other disorder: Secondary | ICD-10-CM | POA: Diagnosis not present

## 2020-04-10 DIAGNOSIS — I2510 Atherosclerotic heart disease of native coronary artery without angina pectoris: Secondary | ICD-10-CM | POA: Diagnosis not present

## 2020-04-10 DIAGNOSIS — H6123 Impacted cerumen, bilateral: Secondary | ICD-10-CM | POA: Diagnosis not present

## 2020-04-11 DIAGNOSIS — Z947 Corneal transplant status: Secondary | ICD-10-CM | POA: Diagnosis not present

## 2020-04-11 DIAGNOSIS — Z961 Presence of intraocular lens: Secondary | ICD-10-CM | POA: Diagnosis not present

## 2020-04-11 DIAGNOSIS — H401134 Primary open-angle glaucoma, bilateral, indeterminate stage: Secondary | ICD-10-CM | POA: Diagnosis not present

## 2020-05-19 DIAGNOSIS — I10 Essential (primary) hypertension: Secondary | ICD-10-CM | POA: Diagnosis not present

## 2020-05-19 DIAGNOSIS — Z8546 Personal history of malignant neoplasm of prostate: Secondary | ICD-10-CM | POA: Diagnosis not present

## 2020-05-19 DIAGNOSIS — Z85118 Personal history of other malignant neoplasm of bronchus and lung: Secondary | ICD-10-CM | POA: Diagnosis not present

## 2020-05-19 DIAGNOSIS — E7800 Pure hypercholesterolemia, unspecified: Secondary | ICD-10-CM | POA: Diagnosis not present

## 2020-05-19 DIAGNOSIS — I2510 Atherosclerotic heart disease of native coronary artery without angina pectoris: Secondary | ICD-10-CM | POA: Diagnosis not present

## 2020-05-19 NOTE — Progress Notes (Signed)
Cardiology Office Note:    Date:  05/23/2020   ID:  Richard [name], DOB 1934/11/17, MRN [id number]  PCP:  Lavone Orn, MD  Cardiologist:  Sinclair Grooms, MD   Referring MD: Lavone Orn, MD   Chief Complaint  Patient presents with  . Coronary Artery Disease    History of Present Illness:    Richard [name] is a 86 y.o. male with a hx of coronary artery diseasewith known chronic total occlusion of the right coronary collateralized from the left and moderate diagonal disease in 2003, sinus node dysfunction, right bundle branch block, hypertension, and fatigue  He is not as active as before.  He does not go to his gym anymore because of the pandemic.  He has been practicing medication.  He is fully vaccinated and boosted.  Denies angina, claudication, transient neurological symptoms, shortness of breath, edema, and orthopnea.  Past Medical History:  Diagnosis Date  . Coronary artery disease   . Coronary artery stenosis   . Hypercholesteremia   . Hypertension   . lung ca dx'd 07/2008    Past Surgical History:  Procedure Laterality Date  . bilateral inguinal hernia repair    . CORNEAL TRANSPLANT    . Left video-assisted thoracic surgery, mini-  10/24/2008    Current Medications: Current Meds  Medication Sig  . amLODipine (NORVASC) 5 MG tablet Take 1 tablet (5 mg total) by mouth daily.  Marland Kitchen atorvastatin (LIPITOR) 80 MG tablet Take 1 tablet (80 mg total) by mouth daily at 6 PM.  . brimonidine (ALPHAGAN P) 0.1 % SOLN Place 2 drops into the left eye daily.   . clopidogrel (PLAVIX) 75 MG tablet Take 1 tablet (75 mg total) by mouth daily. Please keep upcoming appt in January 2022 with Dr. Tamala Julian for future refills. Thank you  . dorzolamide-timolol (COSOPT) 22.3-6.8 MG/ML ophthalmic solution Place 1 drop into both eyes 2 (two) times daily.   . Fluorometholone (FML OP) Place 1 drop into both eyes daily.  Marland Kitchen latanoprost (XALATAN) 0.005 % ophthalmic solution Place 1 drop into both eyes  at bedtime.   . Multiple Vitamins-Minerals (CENTRUM ADULTS) TABS Take 1 tablet by mouth daily.  . Netarsudil Dimesylate 0.02 % SOLN Apply 1 drop to eye daily. Apply to left eye at bedtime  . nitroGLYCERIN (NITROSTAT) 0.4 MG SL tablet Place 0.4 mg under the tongue every 5 (five) minutes as needed.     Allergies:   Aspirin   Social History   Socioeconomic History  . Marital status: Married    Spouse name: Not on file  . Number of children: Not on file  . Years of education: Not on file  . Highest education level: Not on file  Occupational History  . Not on file  Tobacco Use  . Smoking status: Former Smoker    Quit date: 05/10/1962    Years since quitting: 58.0  . Smokeless tobacco: Never Used  Vaping Use  . Vaping Use: Never used  Substance and Sexual Activity  . Alcohol use: Yes  . Drug use: No  . Sexual activity: Not on file  Other Topics Concern  . Not on file  Social History Narrative  . Not on file   Social Determinants of Health   Financial Resource Strain: Not on file  Food Insecurity: Not on file  Transportation Needs: Not on file  Physical Activity: Not on file  Stress: Not on file  Social Connections: Not on file     Family  History: The patient's family history includes Healthy in his brother, mother, and sister; Heart failure in his father; Rheumatic fever in his father.  ROS:   Please see the history of present illness.    Posey Pronto is not as good as it used to be.  Has become a little more sedentary but still does all of his yard work.  All other systems reviewed and are negative.  EKGs/Labs/Other Studies Reviewed:    The following studies were reviewed today: No new laboratory data.  We will repeat today.  EKG:  EKG sinus bradycardia, right bundle, and when compared to 04/16/2019, no changes noted.  Recent Labs: No results found for requested labs within last 8760 hours.  Recent Lipid Panel    Component Value Date/Time   CHOL 178 03/08/2018 0815    TRIG 50 03/08/2018 0815   HDL 66 03/08/2018 0815   CHOLHDL 2.7 03/08/2018 0815   CHOLHDL 2.2 12/30/2015 1023   VLDL 9 12/30/2015 1023   LDLCALC 102 (H) 03/08/2018 0815    Physical Exam:    VS:  BP 128/62   Pulse (!) 53   Ht 5\' 10"  (1.778 m)   Wt 141 lb (64 kg)   SpO2 99%   BMI 20.23 kg/m     Wt Readings from Last 3 Encounters:  05/23/20 141 lb (64 kg)  03/04/20 138 lb 12.8 oz (63 kg)  09/21/19 142 lb 12.8 oz (64.8 kg)     GEN: Frail in appearance although ambulates without difficulty.. No acute distress HEENT: Normal NECK: No JVD. LYMPHATICS: No lymphadenopathy CARDIAC: No murmur. RRR no gallop, or edema. VASCULAR:  Normal Pulses. No bruits. RESPIRATORY:  Clear to auscultation without rales, wheezing or rhonchi  ABDOMEN: Soft, non-tender, non-distended, No pulsatile mass, MUSCULOSKELETAL: No deformity  SKIN: Warm and dry NEUROLOGIC:  Alert and oriented x 3 PSYCHIATRIC:  Normal affect   ASSESSMENT:    1. Coronary artery disease involving native coronary artery of native heart without angina pectoris   2. Sinoatrial node dysfunction (HCC)   3. Essential hypertension   4. Hyperlipidemia LDL goal <70   5. Right bundle branch block   6. Educated about COVID-19 virus infection    PLAN:    In order of problems listed above:  1. Prevention reviewed.  I did encourage him to increase physical activity.  It is not the safest thing for him to be back in the gym during the search. 2. Heart rate is doing well.  Continue to monitor.  He has stable sinus bradycardia at 53 bpm. 3. Excellent blood pressure.  Target 140/80 mmHg or less. 4. LDL was checked last month.  It was slightly elevated..  Continue Lipitor 80 mg/day.  Encouraged him to cut back on carbohydrates in his diet.  Hemoglobin A1c will be checked today. 5. No change 6. Vaccinated, boosted, and practicing medication responsibly.  Overall education and awareness concerning primary/secondary risk prevention was  discussed in detail: LDL less than 70, hemoglobin A1c less than 7, blood pressure target less than 130/80 mmHg, >150 minutes of moderate aerobic activity per week, avoidance of smoking, weight control (via diet and exercise), and continued surveillance/management of/for obstructive sleep apnea.    Medication Adjustments/Labs and Tests Ordered: Current medicines are reviewed at length with the patient today.  Concerns regarding medicines are outlined above.  Orders Placed This Encounter  Procedures  . EKG 12-Lead   No orders of the defined types were placed in this encounter.   There are no  Patient Instructions on file for this visit.   Signed, Sinclair Grooms, MD  05/23/2020 9:59 AM    [facility]

## 2020-05-23 ENCOUNTER — Ambulatory Visit (INDEPENDENT_AMBULATORY_CARE_PROVIDER_SITE_OTHER): Payer: Medicare Other | Admitting: Interventional Cardiology

## 2020-05-23 ENCOUNTER — Other Ambulatory Visit: Payer: Self-pay

## 2020-05-23 ENCOUNTER — Encounter: Payer: Self-pay | Admitting: Interventional Cardiology

## 2020-05-23 VITALS — BP 128/62 | HR 53 | Ht 70.0 in | Wt 141.0 lb

## 2020-05-23 DIAGNOSIS — Z131 Encounter for screening for diabetes mellitus: Secondary | ICD-10-CM | POA: Diagnosis not present

## 2020-05-23 DIAGNOSIS — I2510 Atherosclerotic heart disease of native coronary artery without angina pectoris: Secondary | ICD-10-CM | POA: Diagnosis not present

## 2020-05-23 DIAGNOSIS — I495 Sick sinus syndrome: Secondary | ICD-10-CM | POA: Diagnosis not present

## 2020-05-23 DIAGNOSIS — E785 Hyperlipidemia, unspecified: Secondary | ICD-10-CM

## 2020-05-23 DIAGNOSIS — Z7189 Other specified counseling: Secondary | ICD-10-CM | POA: Diagnosis not present

## 2020-05-23 DIAGNOSIS — I4510 Unspecified right bundle-branch block: Secondary | ICD-10-CM | POA: Diagnosis not present

## 2020-05-23 DIAGNOSIS — I10 Essential (primary) hypertension: Secondary | ICD-10-CM | POA: Diagnosis not present

## 2020-05-23 NOTE — Patient Instructions (Signed)
Medication Instructions:  Your physician recommends that you continue on your current medications as directed. Please refer to the Current Medication list given to you today.  *If you need a refill on your cardiac medications before your next appointment, please call your pharmacy*   Lab Work: A1C today  If you have labs (blood work) drawn today and your tests are completely normal, you will receive your results only by: Marland Kitchen MyChart Message (if you have MyChart) OR . A paper copy in the mail If you have any lab test that is abnormal or we need to change your treatment, we will call you to review the results.   Testing/Procedures: None   Follow-Up: At [facility], you and your health needs are our priority.  As part of our continuing mission to provide you with exceptional heart care, we have created designated Provider Care Teams.  These Care Teams include your primary Cardiologist (physician) and Advanced Practice Providers (APPs -  Physician Assistants and Nurse Practitioners) who all work together to provide you with the care you need, when you need it.  We recommend signing up for the patient portal called "MyChart".  Sign up information is provided on this After Visit Summary.  MyChart is used to connect with patients for Virtual Visits (Telemedicine).  Patients are able to view lab/test results, encounter notes, upcoming appointments, etc.  Non-urgent messages can be sent to your provider as well.   To learn more about what you can do with MyChart, go to [email or web address].    Your next appointment:   1 year(s)  The format for your next appointment:   In Person  Provider:   You may see Sinclair Grooms, MD or one of the following Advanced Practice Providers on your designated Care Team:    Cecilie Kicks, NP  Kathyrn Drown, NP    Other Instructions

## 2020-05-24 LAB — HEMOGLOBIN A1C
Est. average glucose Bld gHb Est-mCnc: 134 mg/dL
Hgb A1c MFr Bld: 6.3 % — ABNORMAL HIGH (ref 4.8–5.6)

## 2020-05-28 ENCOUNTER — Other Ambulatory Visit: Payer: Self-pay | Admitting: Interventional Cardiology

## 2020-05-29 ENCOUNTER — Other Ambulatory Visit: Payer: Self-pay | Admitting: Interventional Cardiology

## 2020-07-19 ENCOUNTER — Other Ambulatory Visit: Payer: Self-pay

## 2020-07-19 ENCOUNTER — Observation Stay (HOSPITAL_COMMUNITY): Payer: Medicare Other

## 2020-07-19 ENCOUNTER — Observation Stay (HOSPITAL_COMMUNITY)
Admission: EM | Admit: 2020-07-19 | Discharge: 2020-07-20 | Disposition: A | Discharge status: Home / Self Care | Payer: Medicare Other | Attending: Family Medicine | Admitting: Family Medicine

## 2020-07-19 ENCOUNTER — Emergency Department (HOSPITAL_COMMUNITY): Payer: Medicare Other

## 2020-07-19 DIAGNOSIS — I639 Cerebral infarction, unspecified: Secondary | ICD-10-CM

## 2020-07-19 DIAGNOSIS — I491 Atrial premature depolarization: Secondary | ICD-10-CM | POA: Diagnosis not present

## 2020-07-19 DIAGNOSIS — R531 Weakness: Secondary | ICD-10-CM | POA: Diagnosis not present

## 2020-07-19 DIAGNOSIS — I6350 Cerebral infarction due to unspecified occlusion or stenosis of unspecified cerebral artery: Secondary | ICD-10-CM

## 2020-07-19 DIAGNOSIS — Z87891 Personal history of nicotine dependence: Secondary | ICD-10-CM | POA: Diagnosis not present

## 2020-07-19 DIAGNOSIS — J449 Chronic obstructive pulmonary disease, unspecified: Secondary | ICD-10-CM | POA: Diagnosis not present

## 2020-07-19 DIAGNOSIS — I10 Essential (primary) hypertension: Secondary | ICD-10-CM | POA: Insufficient documentation

## 2020-07-19 DIAGNOSIS — Z79899 Other long term (current) drug therapy: Secondary | ICD-10-CM | POA: Insufficient documentation

## 2020-07-19 DIAGNOSIS — R42 Dizziness and giddiness: Secondary | ICD-10-CM | POA: Diagnosis not present

## 2020-07-19 DIAGNOSIS — Z20822 Contact with and (suspected) exposure to covid-19: Secondary | ICD-10-CM | POA: Diagnosis not present

## 2020-07-19 DIAGNOSIS — G459 Transient cerebral ischemic attack, unspecified: Principal | ICD-10-CM

## 2020-07-19 DIAGNOSIS — I2510 Atherosclerotic heart disease of native coronary artery without angina pectoris: Secondary | ICD-10-CM | POA: Insufficient documentation

## 2020-07-19 DIAGNOSIS — R471 Dysarthria and anarthria: Secondary | ICD-10-CM | POA: Diagnosis not present

## 2020-07-19 DIAGNOSIS — E119 Type 2 diabetes mellitus without complications: Secondary | ICD-10-CM | POA: Insufficient documentation

## 2020-07-19 DIAGNOSIS — R001 Bradycardia, unspecified: Secondary | ICD-10-CM | POA: Diagnosis not present

## 2020-07-19 DIAGNOSIS — Z85118 Personal history of other malignant neoplasm of bronchus and lung: Secondary | ICD-10-CM | POA: Insufficient documentation

## 2020-07-19 DIAGNOSIS — R4781 Slurred speech: Secondary | ICD-10-CM | POA: Diagnosis not present

## 2020-07-19 DIAGNOSIS — R29810 Facial weakness: Secondary | ICD-10-CM | POA: Diagnosis not present

## 2020-07-19 HISTORY — DX: Cerebral infarction, unspecified: I63.9

## 2020-07-19 LAB — CBC WITH DIFFERENTIAL/PLATELET
Abs Immature Granulocytes: 0.04 10*3/uL (ref 0.00–0.07)
Basophils Absolute: 0.1 10*3/uL (ref 0.0–0.1)
Basophils Relative: 1 %
Eosinophils Absolute: 0.2 10*3/uL (ref 0.0–0.5)
Eosinophils Relative: 2 %
HCT: 37.6 % — ABNORMAL LOW (ref 39.0–52.0)
Hemoglobin: 12.3 g/dL — ABNORMAL LOW (ref 13.0–17.0)
Immature Granulocytes: 0 %
Lymphocytes Relative: 10 %
Lymphs Abs: 0.9 10*3/uL (ref 0.7–4.0)
MCH: 29.6 pg (ref 26.0–34.0)
MCHC: 32.7 g/dL (ref 30.0–36.0)
MCV: 90.4 fL (ref 80.0–100.0)
Monocytes Absolute: 1.3 10*3/uL — ABNORMAL HIGH (ref 0.1–1.0)
Monocytes Relative: 14 %
Neutro Abs: 6.6 10*3/uL (ref 1.7–7.7)
Neutrophils Relative %: 73 %
Platelets: 358 10*3/uL (ref 150–400)
RBC: 4.16 MIL/uL — ABNORMAL LOW (ref 4.22–5.81)
RDW: 15.1 % (ref 11.5–15.5)
WBC: 9.1 10*3/uL (ref 4.0–10.5)
nRBC: 0 % (ref 0.0–0.2)

## 2020-07-19 LAB — BASIC METABOLIC PANEL
Anion gap: 9 (ref 5–15)
BUN: 19 mg/dL (ref 8–23)
CO2: 22 mmol/L (ref 22–32)
Calcium: 8.4 mg/dL — ABNORMAL LOW (ref 8.9–10.3)
Chloride: 105 mmol/L (ref 98–111)
Creatinine, Ser: 1.07 mg/dL (ref 0.61–1.24)
GFR, Estimated: >60 mL/min (ref >60)
Glucose, Bld: 93 mg/dL (ref 70–99)
Potassium: 5 mmol/L (ref 3.5–5.1)
Sodium: 136 mmol/L (ref 135–145)

## 2020-07-19 LAB — SARS CORONAVIRUS 2 (TAT 6-24 HRS): SARS Coronavirus 2: NEGATIVE

## 2020-07-19 MED ORDER — ACETAMINOPHEN 325 MG PO TABS: 650.0000 mg | ORAL_TABLET | ORAL | Status: DC | PRN

## 2020-07-19 MED ORDER — ACETAMINOPHEN 650 MG RE SUPP: 650.0000 mg | RECTAL | Status: DC | PRN

## 2020-07-19 MED ORDER — STROKE: EARLY STAGES OF RECOVERY BOOK
Freq: Once | Status: AC
  Filled 2020-07-19: qty 1

## 2020-07-19 MED ORDER — HYDRALAZINE HCL 25 MG PO TABS: 25.0000 mg | ORAL_TABLET | Freq: Four times a day (QID) | ORAL | Status: DC | PRN

## 2020-07-19 MED ORDER — LORAZEPAM 0.5 MG PO TABS: 0.5000 mg | ORAL_TABLET | ORAL | Status: DC | PRN

## 2020-07-19 MED ORDER — SENNOSIDES-DOCUSATE SODIUM 8.6-50 MG PO TABS: 1.0000 | ORAL_TABLET | Freq: Every evening | ORAL | Status: DC | PRN

## 2020-07-19 MED ORDER — ATORVASTATIN CALCIUM 80 MG PO TABS: 80.0000 mg | ORAL_TABLET | Freq: Every day | ORAL | Status: DC

## 2020-07-19 MED ORDER — NETARSUDIL DIMESYLATE 0.02 % OP SOLN
1.0000 [drp] | Freq: Every day | OPHTHALMIC | Status: DC
  Administered 2020-07-19: 1 [drp] via OPHTHALMIC
  Filled 2020-07-19: qty 1

## 2020-07-19 MED ORDER — ATORVASTATIN CALCIUM 80 MG PO TABS
80.0000 mg | ORAL_TABLET | Freq: Every day | ORAL | Status: DC
  Administered 2020-07-19: 80 mg via ORAL
  Filled 2020-07-19 (×2): qty 1

## 2020-07-19 MED ORDER — FLUOROMETHOLONE 0.1 % OP SUSP
1.0000 [drp] | Freq: Every day | OPHTHALMIC | Status: DC
  Administered 2020-07-20: 1 [drp] via OPHTHALMIC
  Filled 2020-07-19: qty 5

## 2020-07-19 MED ORDER — CLOPIDOGREL BISULFATE 75 MG PO TABS: 75.0000 mg | ORAL_TABLET | Freq: Every day | ORAL | Status: DC

## 2020-07-19 MED ORDER — ACETAMINOPHEN 160 MG/5ML PO SOLN: 650.0000 mg | ORAL | Status: DC | PRN

## 2020-07-19 MED ORDER — LATANOPROST 0.005 % OP SOLN
1.0000 [drp] | Freq: Every day | OPHTHALMIC | Status: DC
  Administered 2020-07-19: 1 [drp] via OPHTHALMIC
  Filled 2020-07-19: qty 2.5

## 2020-07-19 MED ORDER — BRIMONIDINE TARTRATE 0.15 % OP SOLN: 1.0000 [drp] | Freq: Every day | OPHTHALMIC | Status: DC

## 2020-07-19 MED ORDER — CLOPIDOGREL BISULFATE 75 MG PO TABS
75.0000 mg | ORAL_TABLET | Freq: Every day | ORAL | Status: DC
  Administered 2020-07-19: 75 mg via ORAL
  Filled 2020-07-19 (×2): qty 1

## 2020-07-19 MED ORDER — DORZOLAMIDE HCL-TIMOLOL MAL 2-0.5 % OP SOLN
1.0000 [drp] | Freq: Two times a day (BID) | OPHTHALMIC | Status: DC
  Administered 2020-07-19 – 2020-07-20 (×2): 1 [drp] via OPHTHALMIC
  Filled 2020-07-19: qty 10

## 2020-07-19 MED ORDER — BRIMONIDINE TARTRATE 0.15 % OP SOLN
1.0000 [drp] | Freq: Every day | OPHTHALMIC | Status: DC
  Administered 2020-07-20: 1 [drp] via OPHTHALMIC
  Filled 2020-07-19: qty 5

## 2020-07-19 MED ORDER — ENOXAPARIN SODIUM 40 MG/0.4ML ~~LOC~~ SOLN
40.0000 mg | SUBCUTANEOUS | Status: DC
  Administered 2020-07-19: 40 mg via SUBCUTANEOUS
  Filled 2020-07-19: qty 0.4

## 2020-07-19 NOTE — ED Notes (Signed)
Returned to ED from MRI.

## 2020-07-19 NOTE — ED Notes (Signed)
Admitting at bedside 

## 2020-07-19 NOTE — ED Notes (Signed)
Phone report called to [facility], all questions answered.

## 2020-07-19 NOTE — ED Notes (Signed)
Patient transported to MRI 

## 2020-07-19 NOTE — ED Notes (Signed)
Patient reading paper, spouse at bedside.

## 2020-07-19 NOTE — Progress Notes (Signed)
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  D/W patient and wife about the MRI result. Patient reported developed hives in the past when taking ASA. Will continue current Plavix therapy.

## 2020-07-19 NOTE — H&P (Signed)
History and Physical    DOROTHY LANDGREBE PPI:[id number] DOB: October 19, 1934 DOA: 07/19/2020  PCP: Lavone Orn, MD (Confirm with patient/family/NH records and if not entered, this has to be entered at [facility] point of entry) Patient coming from: Home  I have personally briefly reviewed patient's old medical records in [place]  Chief Complaint: Slurred speech and arm sluggish  HPI: Richard [name] is a 86 y.o. male with medical history significant of CAD on Plavix, HTN, HLD, remote history of lung CA status post RUL lobectomy in 2010 and status post radiation therapy, now in remission, presented with sudden onset of speech problem and balance issue.  Patient was driving on the hilly road in [place] this morning, suddenly started to feel "hands are heavy and sluggish", had to pull over and switched driver place his wife, at that time wife noticed the patient's speech was slow and slurred.  Patient then felt very tired and slept during the rest of the trip for about an hour and half.  When he arrived at home, he felt he has recovered from the episode but then his neighbor came in and he was showing a box to the neighbor and again he felt his left hand very sluggish and speech became slurred again.  Denies any trouble understanding or form sentences, denied any numbness of any of the limbs.  No headache no blurry vision.  No fever chills.. ED Course: CT head negative for acute findings.  Review of Systems: As per HPI otherwise 14 point review of systems negative.    Past Medical History:  Diagnosis Date  . Coronary artery disease   . Coronary artery stenosis   . Hypercholesteremia   . Hypertension   . lung ca dx'd 07/2008    Past Surgical History:  Procedure Laterality Date  . bilateral inguinal hernia repair    . CORNEAL TRANSPLANT    . Left video-assisted thoracic surgery, mini-  10/24/2008     reports that he quit smoking about 58 years ago. He has never used smokeless tobacco. He reports  current alcohol use. He reports that he does not use drugs.  Allergies  Allergen Reactions  . Aspirin Hives    Family History  Problem Relation Age of Onset  . Healthy Mother   . Heart failure Father   . Rheumatic fever Father   . Healthy Sister   . Healthy Brother     Prior to Admission medications   Medication Sig Start Date End Date Taking? Authorizing Provider  amLODipine (NORVASC) 5 MG tablet TAKE 1 TABLET(5 MG) BY MOUTH DAILY 05/29/20   Belva Crome, MD  atorvastatin (LIPITOR) 80 MG tablet TAKE 1 TABLET EVERY DAY AT 6 PM. 05/28/20   Belva Crome, MD  brimonidine (ALPHAGAN P) 0.1 % SOLN Place 2 drops into the left eye daily.     [provider]  clopidogrel (PLAVIX) 75 MG tablet Take 1 tablet (75 mg total) by mouth daily. 05/29/20   Belva Crome, MD  dorzolamide-timolol (COSOPT) 22.3-6.8 MG/ML ophthalmic solution Place 1 drop into both eyes 2 (two) times daily.     [provider]  Fluorometholone (FML OP) Place 1 drop into both eyes daily.    [provider]  latanoprost (XALATAN) 0.005 % ophthalmic solution Place 1 drop into both eyes at bedtime.  06/30/11   [provider]  Multiple Vitamins-Minerals (CENTRUM ADULTS) TABS Take 1 tablet by mouth daily.    [provider]  Netarsudil  Dimesylate 0.02 % SOLN Apply 1 drop to eye daily. Apply to left eye at bedtime 04/19/17   [provider]  nitroGLYCERIN (NITROSTAT) 0.4 MG SL tablet Place 0.4 mg under the tongue every 5 (five) minutes as needed.    [provider]    Physical Exam: Vitals:   07/19/20 1401 07/19/20 1402 07/19/20 1515 07/19/20 1615  BP:  (!) 151/81 (!) 166/82 (!) 154/72  Pulse: 68  80 60  Resp: (!) 22  12 (!) 23  Temp:  98.6 F (37 C)    TempSrc:      SpO2: 99%  100% 100%    Constitutional: NAD, calm, comfortable Vitals:   07/19/20 1401 07/19/20 1402 07/19/20 1515 07/19/20 1615  BP:  (!) 151/81 (!) 166/82 (!) 154/72  Pulse: 68  80 60   Resp: (!) 22  12 (!) 23  Temp:  98.6 F (37 C)    TempSrc:      SpO2: 99%  100% 100%   Eyes: PERRL, lids and conjunctivae normal ENMT: Mucous membranes are moist. Posterior pharynx clear of any exudate or lesions.Normal dentition.  Neck: normal, supple, no masses, no thyromegaly Respiratory: clear to auscultation bilaterally, no wheezing, no crackles. Normal respiratory effort. No accessory muscle use.  Cardiovascular: Regular rate and rhythm, no murmurs / rubs / gallops. No extremity edema. 2+ pedal pulses. No carotid bruits.  Abdomen: no tenderness, no masses palpated. No hepatosplenomegaly. Bowel sounds positive.  Musculoskeletal: no clubbing / cyanosis. No joint deformity upper and lower extremities. Good ROM, no contractures. Normal muscle tone.  Skin: no rashes, lesions, ulcers. No induration Neurologic: CN 2-12 grossly intact. Sensation intact, DTR normal. Strength 5/5 in all 4.  Psychiatric: Normal judgment and insight. Alert and oriented x 3. Normal mood.     Labs on Admission: I have personally reviewed following labs and imaging studies  CBC: Recent Labs  Lab 07/19/20 1424  WBC 9.1  NEUTROABS 6.6  HGB 12.3*  HCT 37.6*  MCV 90.4  PLT 025   Basic Metabolic Panel: Recent Labs  Lab 07/19/20 1424  NA 136  K 5.0  CL 105  CO2 22  GLUCOSE 93  BUN 19  CREATININE 1.07  CALCIUM 8.4*   GFR: CrCl cannot be calculated (Unknown ideal weight.). Liver Function Tests: No results for input(s): AST, ALT, ALKPHOS, BILITOT, PROT, ALBUMIN in the last 168 hours. No results for input(s): LIPASE, AMYLASE in the last 168 hours. No results for input(s): AMMONIA in the last 168 hours. Coagulation Profile: No results for input(s): INR, PROTIME in the last 168 hours. Cardiac Enzymes: No results for input(s): CKTOTAL, CKMB, CKMBINDEX, TROPONINI in the last 168 hours. BNP (last 3 results) No results for input(s): PROBNP in the last 8760 hours. HbA1C: No results for input(s):  HGBA1C in the last 72 hours. CBG: No results for input(s): GLUCAP in the last 168 hours. Lipid Profile: No results for input(s): CHOL, HDL, LDLCALC, TRIG, CHOLHDL, LDLDIRECT in the last 72 hours. Thyroid Function Tests: No results for input(s): TSH, T4TOTAL, FREET4, T3FREE, THYROIDAB in the last 72 hours. Anemia Panel: No results for input(s): VITAMINB12, FOLATE, FERRITIN, TIBC, IRON, RETICCTPCT in the last 72 hours. Urine analysis:    Component Value Date/Time   COLORURINE YELLOW 10/22/2008 1230   APPEARANCEUR CLEAR 10/22/2008 1230   LABSPEC 1.025 10/22/2008 1230   PHURINE 7.0 10/22/2008 1230   GLUCOSEU NEGATIVE 10/22/2008 1230   HGBUR NEGATIVE 10/22/2008 [place] 10/22/2008 1230   KETONESUR NEGATIVE  10/22/2008 1230   PROTEINUR NEGATIVE 10/22/2008 1230   UROBILINOGEN 1.0 10/22/2008 1230   NITRITE NEGATIVE 10/22/2008 1230   LEUKOCYTESUR  10/22/2008 1230    NEGATIVE MICROSCOPIC NOT DONE ON URINES WITH NEGATIVE PROTEIN, BLOOD, LEUKOCYTES, NITRITE, OR GLUCOSE <1000 mg/dL.    Radiological Exams on Admission: CT Head Wo Contrast  Result Date: 07/19/2020 CLINICAL DATA:  Slurred speech and left arm weakness. EXAM: CT HEAD WITHOUT CONTRAST TECHNIQUE: Contiguous axial images were obtained from the base of the skull through the vertex without intravenous contrast. COMPARISON:  None. FINDINGS: Brain: There is mild cerebral atrophy with widening of the extra-axial spaces and ventricular dilatation. There are areas of decreased attenuation within the white matter tracts of the supratentorial brain, consistent with microvascular disease changes. Small, bilateral chronic basal ganglia lacunar infarcts are seen. Vascular: No hyperdense vessel or unexpected calcification. Skull: Normal. Negative for fracture or focal lesion. Sinuses/Orbits: No acute finding. Other: None. IMPRESSION: Generalized cerebral atrophy without acute intracranial pathology. Electronically Signed   By: Virgina Norfolk M.D.   On: 07/19/2020 14:54    EKG: Independently reviewed.  Chronic RBBB, no acute ST-T changes  Assessment/Plan Active Problems:   TIA (transient ischemic attack)  (please populate well all problems here in Problem List. (For example, if patient is on BP meds at home and you resume or decide to hold them, it is a problem that needs to be her. Same for CAD, COPD, HLD and so on)  Dysarthria and ataxia -Suspect TIA -MRI and MRA -Echocardiogram -Allow permissive hypertension, hold home BP meds, as needed hydralazine for now. -Continue Plavix, patient is allergic to aspirin.  HTN -As above  HLD -Continue high-dose statin  CAD -No acute issue   DVT prophylaxis: Lovenox  code Status: Full code Family Communication:Wife at bedside Disposition Plan: Expect less than 2 midnight hospital stay, once stroke work-up done likely can be discharged home tomorrow. Consults called: Neuro Admission status: Tele obs   Lequita Halt MD [facility] Pager [phone]  07/19/2020, 4:33 PM

## 2020-07-19 NOTE — ED Triage Notes (Addendum)
Pt BIBA from home due to slurred speech and slight left arm weakness that started at 0930 this AM while patient was traveling from [place]. Denies stroke or TIA hx. GCS 15. Slurred speech assessed. Pt able to move all extremities against gravity. EMS CBG 116

## 2020-07-19 NOTE — ED Provider Notes (Signed)
[facility] EMERGENCY DEPARTMENT Provider Note   CSN: [id number] Arrival date & time: 07/19/20  1351     History Chief Complaint  Patient presents with  . Aphasia    Richard [name] is a 86 y.o. male.  Patient presents chief complaint of slurred speech.  He states that symptoms began around 10 AM today and lasted for about 15 minutes before resolving noticed by his wife.  And then again around 12:48 PM he had slurred speech again and an unsteady gait that lasted about 15 minutes and then has since resolved.  Patient otherwise denies any prior history of strokes or TIAs in the past.  Denies any fevers vomiting cough or diarrhea.  Denies headache chest pain or abdominal pain.  He states that he was visiting [place] and had driven back this morning prior to these episodes.  Denies any leg swelling or leg pain or shortness of breath or chest pain.        Past Medical History:  Diagnosis Date  . Coronary artery disease   . Coronary artery stenosis   . Hypercholesteremia   . Hypertension   . lung ca dx'd 07/2008    Patient Active Problem List   Diagnosis Date Noted  . DOE (dyspnea on exertion) 02/10/2017  . Abnormal CT scan, chest 04/08/2016  . COPD (chronic obstructive pulmonary disease) ([place]) 04/08/2016  . CAD (coronary artery disease), native coronary artery 08/30/2013  . Sinoatrial node dysfunction ([place]) 08/30/2013  . Right bundle branch block 08/30/2013  . Essential hypertension 08/30/2013  . COUGH 11/12/2008  . HYPOXEMIA 11/12/2008  . CARCINOMA, LUNG, NONSMALL CELL 11/08/2008  . Hyperlipidemia LDL goal <70 11/08/2008    Past Surgical History:  Procedure Laterality Date  . bilateral inguinal hernia repair    . CORNEAL TRANSPLANT    . Left video-assisted thoracic surgery, mini-  10/24/2008       Family History  Problem Relation Age of Onset  . Healthy Mother   . Heart failure Father   . Rheumatic fever Father   . Healthy Sister   . Healthy  Brother     Social History   Tobacco Use  . Smoking status: Former Smoker    Quit date: 05/10/1962    Years since quitting: 58.2  . Smokeless tobacco: Never Used  Vaping Use  . Vaping Use: Never used  Substance Use Topics  . Alcohol use: Yes  . Drug use: No    Home Medications Prior to Admission medications   Medication Sig Start Date End Date Taking? Authorizing Provider  amLODipine (NORVASC) 5 MG tablet TAKE 1 TABLET(5 MG) BY MOUTH DAILY 05/29/20   Belva Crome, MD  atorvastatin (LIPITOR) 80 MG tablet TAKE 1 TABLET EVERY DAY AT 6 PM. 05/28/20   Belva Crome, MD  brimonidine (ALPHAGAN P) 0.1 % SOLN Place 2 drops into the left eye daily.     [provider]  clopidogrel (PLAVIX) 75 MG tablet Take 1 tablet (75 mg total) by mouth daily. 05/29/20   Belva Crome, MD  dorzolamide-timolol (COSOPT) 22.3-6.8 MG/ML ophthalmic solution Place 1 drop into both eyes 2 (two) times daily.     [provider]  Fluorometholone (FML OP) Place 1 drop into both eyes daily.    [provider]  latanoprost (XALATAN) 0.005 % ophthalmic solution Place 1 drop into both eyes at bedtime.  06/30/11   [provider]  Multiple Vitamins-Minerals (CENTRUM ADULTS) TABS Take 1 tablet by mouth daily.  [provider]  Netarsudil Dimesylate 0.02 % SOLN Apply 1 drop to eye daily. Apply to left eye at bedtime 04/19/17   [provider]  nitroGLYCERIN (NITROSTAT) 0.4 MG SL tablet Place 0.4 mg under the tongue every 5 (five) minutes as needed.    [provider]    Allergies    Aspirin  Review of Systems   Review of Systems  Constitutional: Negative for fever.  HENT: Negative for ear pain and sore throat.   Eyes: Negative for pain.  Respiratory: Negative for cough.   Cardiovascular: Negative for chest pain.  Gastrointestinal: Negative for abdominal pain.  Genitourinary: Negative for flank pain.  Musculoskeletal: Negative for back pain.  Skin:  Negative for color change and rash.  Neurological: Negative for syncope.  All other systems reviewed and are negative.   Physical Exam Updated Vital Signs BP (!) 151/81   Pulse 68   Temp 98.6 F (37 C)   Resp (!) 22   SpO2 99%   Physical Exam Constitutional:      General: He is not in acute distress.    Appearance: He is well-developed.  HENT:     Head: Normocephalic.     Nose: Nose normal.  Eyes:     Extraocular Movements: Extraocular movements intact.  Cardiovascular:     Rate and Rhythm: Normal rate.  Pulmonary:     Effort: Pulmonary effort is normal.  Skin:    Coloration: Skin is not jaundiced.  Neurological:     General: No focal deficit present.     Mental Status: He is alert and oriented to person, place, and time. Mental status is at baseline.     Cranial Nerves: No cranial nerve deficit.     Motor: No weakness.     Gait: Gait normal.     Comments: 5/5 strength all extremities.  Cranial nerves II to XII intact.     ED Results / Procedures / Treatments   Labs (all labs ordered are listed, but only abnormal results are displayed) Labs Reviewed  SARS CORONAVIRUS 2 (TAT 6-24 HRS)  CBC WITH DIFFERENTIAL/PLATELET  BASIC METABOLIC PANEL    EKG None  Radiology No results found.  Procedures Procedures   Medications Ordered in ED Medications - No data to display  ED Course  I have reviewed the triage vital signs and the nursing notes.  Pertinent labs & imaging results that were available during my care of the patient were reviewed by me and considered in my medical decision making (see chart for details).    MDM Rules/Calculators/A&P                          Patient remains asymptomatic at this time.  Given his description concern for TIA.  Patient extensive cardiovascular disease and continues on Plavix. We will pursue blood test CT imaging and will likely need MRI and TIA work-up.   Final Clinical Impression(s) / ED Diagnoses Final diagnoses:   TIA (transient ischemic attack)    Rx / DC Orders ED Discharge Orders    None       Luna Fuse, MD 07/19/20 1415

## 2020-07-19 NOTE — ED Provider Notes (Signed)
Patient seen after prior ED provider.  Patient will require admission for further work-up of likely TIA.  Neurology is aware case and will consult.  Hospitalist service is aware of case and will evaluate for admission.  Patient and his wife are aware of plan of care.    Valarie Merino, MD 07/19/20 [phone]

## 2020-07-20 ENCOUNTER — Observation Stay (HOSPITAL_BASED_OUTPATIENT_CLINIC_OR_DEPARTMENT_OTHER): Payer: Medicare Other

## 2020-07-20 DIAGNOSIS — G459 Transient cerebral ischemic attack, unspecified: Secondary | ICD-10-CM

## 2020-07-20 DIAGNOSIS — I6312 Cerebral infarction due to embolism of basilar artery: Secondary | ICD-10-CM

## 2020-07-20 DIAGNOSIS — I6350 Cerebral infarction due to unspecified occlusion or stenosis of unspecified cerebral artery: Secondary | ICD-10-CM | POA: Diagnosis not present

## 2020-07-20 LAB — ECHOCARDIOGRAM COMPLETE
AR max vel: 2.65 cm2
AV Area VTI: 2.53 cm2
AV Area mean vel: 2.77 cm2
AV Mean grad: 3 mmHg
AV Peak grad: 5.7 mmHg
Ao pk vel: 1.19 m/s
Area-P 1/2: 2.55 cm2
MV VTI: 2.25 cm2
S' Lateral: 4.5 cm
Weight: 2067.03 oz

## 2020-07-20 LAB — LIPID PANEL
Cholesterol: 157 mg/dL (ref 0–200)
HDL: 58 mg/dL (ref >40)
LDL Cholesterol: 86 mg/dL (ref 0–99)
Total CHOL/HDL Ratio: 2.7 RATIO
Triglycerides: 66 mg/dL (ref <150)
VLDL: 13 mg/dL (ref 0–40)

## 2020-07-20 LAB — HEMOGLOBIN A1C
Hgb A1c MFr Bld: 6.5 % — ABNORMAL HIGH (ref 4.8–5.6)
Mean Plasma Glucose: 139.85 mg/dL

## 2020-07-20 MED ORDER — METFORMIN HCL 500 MG PO TABS: 500.0000 mg | ORAL_TABLET | Freq: Every day | ORAL | 0 refills | Status: DC

## 2020-07-20 MED ORDER — AMLODIPINE BESYLATE 5 MG PO TABS: 5.0000 mg | ORAL_TABLET | Freq: Every day | ORAL | Status: DC

## 2020-07-20 MED ORDER — TICAGRELOR 90 MG PO TABS: 90.0000 mg | ORAL_TABLET | Freq: Two times a day (BID) | ORAL | 0 refills | Status: AC

## 2020-07-20 MED ORDER — ASPIRIN 81 MG PO CHEW: 81.0000 mg | CHEWABLE_TABLET | Freq: Once | ORAL | Status: DC

## 2020-07-20 MED ORDER — ASPIRIN EC 81 MG PO TBEC
81.0000 mg | DELAYED_RELEASE_TABLET | Freq: Every day | ORAL | Status: DC
  Administered 2020-07-20: 81 mg via ORAL
  Filled 2020-07-20: qty 1

## 2020-07-20 MED ORDER — TICAGRELOR 90 MG PO TABS
180.0000 mg | ORAL_TABLET | Freq: Once | ORAL | Status: DC
  Filled 2020-07-20: qty 2

## 2020-07-20 MED ORDER — ASPIRIN 81 MG PO TBEC: 81.0000 mg | DELAYED_RELEASE_TABLET | Freq: Every day | ORAL | 0 refills | Status: AC

## 2020-07-20 MED ORDER — TICAGRELOR 90 MG PO TABS
90.0000 mg | ORAL_TABLET | Freq: Two times a day (BID) | ORAL | Status: DC
Start: 2020-07-21 — End: 2020-07-20

## 2020-07-20 NOTE — Care Management (Signed)
Spoke w patient and wife at bedside. Discussed plan for discharge. Patient would like outpatient PT OT as recommended by therapist evals. Referral made to [facility] as per request of patient and wife.  Brilinta card tubed to unit with instructions for use. Requested nurse to deliver Obs notice and Brilinta card to room prior to DC.

## 2020-07-20 NOTE — Discharge Instructions (Signed)
Eating Plan After Stroke A stroke causes damage to the brain cells, which can affect your ability to walk, talk, and even eat. The impact of a stroke is different for everyone, and so is recovery. A good nutrition plan is important for your recovery. It can also lower your risk of another stroke. If you have difficulty chewing and swallowing your food, a dietitian or your stroke care team can help so that you can enjoy eating healthy foods. What are tips for following this plan? Reading food labels  Choose foods that have less than 300 milligrams (mg) of sodium per serving. Limit your sodium intake to less than 1,500 mg per day.  Avoid foods that have saturated fat and trans fat.  Choose foods that are low in cholesterol. Limit the amount of cholesterol you eat each day to less than 200 mg.  Choose foods that are high in fiber. Eat 20-30 grams (g) of fiber each day.  Avoid foods with added sugar. Check the food label for ingredients such as sugar, corn syrup, honey, fructose, molasses, and cane juice. Shopping  At the grocery store, buy most of your food from areas near the walls of the store. This includes: ? Fresh fruits and vegetables. ? Dry grains, beans, nuts, and seeds. ? Fresh seafood, poultry, lean meats, and eggs. ? Low-fat dairy products.  Buy whole ingredients instead of prepackaged foods.  Buy fresh, in-season fruits and vegetables from local farmers markets.  Buy frozen fruits and vegetables in resealable bags. Cooking  Prepare foods with very little salt. Use herbs or salt-free spices instead.  Cook with heart-healthy oils, such as olive, avocado, canola, soybean, or sunflower oil.  Avoid frying foods. Bake, grill, or broil foods instead.  Remove visible fat and skin from meat and poultry before eating.  Modify food textures as told by your health care provider. Meal planning  Eat a wide variety of colorful fruits and vegetables. Make sure one-half of your plate  is filled with fruits and vegetables at each meal.  Eat fruits and vegetables that are high in potassium, such as: ? Apples, bananas, oranges, and melon. ? Sweet potatoes, spinach, zucchini, and tomatoes.  Eat fish that contain heart-healthy fats (omega-3 fats) at least twice a week. These include salmon, tuna, mackerel, and sardines.  Eat plant foods that are high in omega-3 fats, such as flaxseeds and walnuts. Add these to cereals, yogurt, or pasta dishes.  Eat several servings of high-fiber foods each day, such as fruits, vegetables, whole grains, and beans.  Do not put salt at the table for meals.  When eating out at restaurants: ? Ask the server about low-salt or salt-free food options. ? Avoid fried foods. Look for menu items that are grilled, steamed, broiled, or roasted. ? Ask if your food can be prepared without butter. ? Ask for condiments, such as salad dressings, gravy, or sauces to be served on the side.  If you have difficulty swallowing: ? Choose foods that are softer and easier to chew and swallow. ? Cut foods into small pieces and chew well before swallowing. ? Thicken liquids as told by your health care provider or dietitian. ? Let your health care provider know if your condition does not improve over time. You may need to work with a speech therapist to re-train the muscles that are used for eating. General recommendations  Involve your family and friends in your recovery, if possible. It may be helpful to have a slower meal time and  to plan meals that include foods everyone in the family can eat.  Brush your teeth with fluoride toothpaste twice a day, and floss once a day. Keeping a clean mouth can help you swallow and can also help your appetite.  Drink enough water each day to keep your urine pale yellow. If needed, set reminders or ask your family to help you remember to drink water.  Limit alcohol intake to no more than 1 drink a day for nonpregnant women and  2 drinks a day for men. One drink equals 12 oz of beer, 5 oz of wine, or 1 oz of hard liquor.   Summary  Following this eating plan can help in your stroke recovery and can decrease your risk for another stroke.  Let your health care provider know if you have problems with swallowing. You may need to work with a speech therapist. This information is not intended to replace advice given to you by your health care provider. Make sure you discuss any questions you have with your health care provider. Document Revised: 08/17/2018 Document Reviewed: 07/04/2017 Elsevier Patient Education  2021 [facility] Discharge After a Stroke  Being discharged from the hospital after a stroke can feel overwhelming. Many things may be different. It is normal to feel scared or anxious. Some stroke survivors may be able to return to their homes. Others may need more specialized care on a temporary or permanent basis. Your stroke care team will work with you to develop a discharge plan that is best for you. Ask questions if you do not understand something. Invite a friend or family member to participate in discharge planning. It is important to understand and follow your discharge plan to help prevent another stroke or other problems. General recommendations  Ask a friend or family member to get needed things in place before you go home if possible. A therapist can come to your home to make recommendations for safety equipment. Ask your health care provider if you would benefit from this service or from home care.  Take steps to prevent falls, such as: ? Installing grab bars in the shower or using a shower chair. ? Install grab bars by the toilet. ? Removing tripping hazards, such as area rugs or cords.   Supplies needed Ask your health care provider for a list of medical equipment and supplies you will need at home. These may include:  Walkers.  Canes.  Wheelchairs.  Hand-strengthening  devices.  Special eating utensils. Medical equipment can be rented or purchased, depending on your insurance coverage. Check with your insurance company about what is covered. Follow these instructions at home: Medicines  Take all medicines exactly as told by your health care provider to prevent serious harm, such as another stroke. Make sure you understand: ? What medicine to take. ? Why you are taking the medicine. ? How and when to take it. ? If it can be taken with your other medicines and herbal supplements. ? Possible side effects, and when to call your health care provider if you have side effects. ? How you will get and pay for your medicines. Medical assistance programs may be able to help you pay for prescription medicines if you cannot afford them.  If you are taking an anticoagulant: ? Be sure to take it exactly as told by your health care provider. This medicine can increase the risk of bleeding because it works to prevent blood clots. You may need to take certain  precautions to prevent bleeding. ? Do not take medicines that contain aspirin or NSAIDs, such as ibuprofen, unless your health care provider approves. These medicines increase your risk for dangerous bleeding. Preventing another stroke Having a stroke puts you at risk for another stroke in the future. Ask your health care provider what actions you can take to lower the risk. These may include:  Increasing how much you exercise.  Making a healthy eating plan.  Quitting smoking.  Managing other health conditions, such as high blood pressure, high cholesterol, or diabetes.  Limiting alcohol use. General instructions  Before you leave the hospital, you will be given information about stroke warning signs. Share these with your friends and family members.  You will need to follow up regularly with a health care provider. You may need rehabilitation, such as physical therapy, occupational therapy, and  speech-language therapy. Keeping these appointments is very important to your recovery.  Be sure to bring your medicine list and discharge papers to your appointments. Use a calendar or appointment reminder if you need help to keep track of your schedule. Contact a health care provider if:  You are taking an anticoagulant, and you have one or more of these problems: ? Bleeding or bruising. ? A fall or other injury to your head. ? Blood in your urine or stool (feces). Get help right away if you:  Have any symptoms of a stroke. "BE FAST" is an easy way to remember the main warning signs of a stroke: ? B - Balance. Signs are dizziness, sudden trouble walking, or loss of balance. ? E - Eyes. Signs are trouble seeing or a sudden change in vision. ? F - Face. Signs are sudden weakness or numbness of the face, or the face or eyelid drooping on one side. ? A - Arms. Signs are weakness or numbness in an arm. This happens suddenly and usually on one side of the body. ? S - Speech. Signs are sudden trouble speaking, slurred speech, or trouble understanding what people say. ? T - Time. Time to call emergency services. Write down what time symptoms started. Your emergency responders will need to know this information.  Have other signs of a stroke, such as: ? A sudden, severe headache with no known cause. ? Nausea or vomiting. ? Seizure. These symptoms may represent a serious problem that is an emergency. Do not wait to see if the symptoms will go away. Get medical help right away. Call your local emergency services (911 in the [place] Do not drive yourself to the hospital.   Summary  Being discharged from the hospital after a stroke can feel overwhelming. It is normal to feel scared or anxious.  Make sure you take medicines exactly as told by your health care provider.  Know the warning signs of a stroke. Before you leave the hospital, you will be given information about stroke warning signs. Share  these with your friends and family members.  Get help right way if you have any symptoms of a stroke. "BE FAST" is an easy way to remember the main warning signs of a stroke. This information is not intended to replace advice given to you by your health care provider. Make sure you discuss any questions you have with your health care provider. Document Revised: 12/12/2019 Document Reviewed: 12/12/2019 Elsevier Patient Education  2021 [employer].

## 2020-07-20 NOTE — Evaluation (Signed)
Physical Therapy Evaluation Patient Details Name: Richard [name] MRN: [id number] DOB: 1934/10/07 Today's Date: 07/20/2020   History of Present Illness  86 yo male presents to ED on 3/12 with x2 episodes of slurred speech, LUE weakness, giat instability that resolved within 15 minutes. MRI brain reveals acute right pontine ischemic infarction measuring 8 mm in size. PMH includes CAD, lung cancer s/p RUE lobectomy 2010 with radiation now in remission, HTN.  Clinical Impression   Pt presents with generalized weakness, mild incoordination LUE/LLE, poor insight into deficits, impaired gait, impaired dynamic standing balance with DGI score 15/24 indicating high fall risk, and decreased activity tolerance vs baseline. Pt to benefit from acute PT to address deficits. Pt ambulated hallway distance without AD, mildly ataxic with poor motor grading noted. Per pt, he feels fine but deficits vs baseline very evident. PT recommending OPPT to address impaired balance s/p CVA. PT reviewed BE FAST s/s of CVA with pt and family, and written down for pt's wife per pt's wife request. PT to progress mobility as tolerated, and will continue to follow acutely.      Follow Up Recommendations Outpatient PT;Supervision for mobility/OOB    Equipment Recommendations  None recommended by PT    Recommendations for Other Services       Precautions / Restrictions Precautions Precautions: Fall Restrictions Weight Bearing Restrictions: No      Mobility  Bed Mobility Overal bed mobility: Modified Independent             General bed mobility comments: very increased time and effort to come to EOB sitting    Transfers Overall transfer level: Needs assistance Equipment used: None Transfers: Sit to/from Stand Sit to Stand: Min guard         General transfer comment: for safety, slow to rise and steady  Ambulation/Gait Ambulation/Gait assistance: Min guard;Min assist Gait Distance (Feet): 250  Feet Assistive device: None Gait Pattern/deviations: Step-through pattern;Decreased stride length;Wide base of support;Drifts right/left Gait velocity: decr   General Gait Details: min guard for safety, occasional min assist to steady during challenges to gait (see balance section). Wide BOS, impaired ability to grade motor response noted in heavy foot fall during gait.  Stairs Stairs: Yes Stairs assistance: Min guard Stair Management: One rail Right;Alternating pattern;Forwards Number of Stairs: 3 General stair comments: min guard for safety, x1 decreased foot clearance during ascending steps but pt-corrected  Wheelchair Mobility    Modified Rankin (Stroke Patients Only) Modified Rankin (Stroke Patients Only) Pre-Morbid Rankin Score: No symptoms Modified Rankin: Moderately severe disability     Balance Overall balance assessment: Needs assistance;History of Falls Sitting-balance support: No upper extremity supported;Feet supported Sitting balance-Leahy Scale: Fair Sitting balance - Comments: posterior leaning with LE movement Postural control: Posterior lean Standing balance support: No upper extremity supported;During functional activity Standing balance-Leahy Scale: Fair Standing balance comment: impaired dynamic standing balance when challenged (see DGI)                 Standardized Balance Assessment Standardized Balance Assessment : Dynamic Gait Index   Dynamic Gait Index Level Surface: Mild Impairment Change in Gait Speed: Mild Impairment Gait with Horizontal Head Turns: Mild Impairment Gait with Vertical Head Turns: Mild Impairment Gait and Pivot Turn: Mild Impairment Step Over Obstacle: Moderate Impairment Step Around Obstacles: Mild Impairment Steps: Mild Impairment Total Score: 15       Pertinent Vitals/Pain Pain Assessment: Faces Faces Pain Scale: No hurt Pain Intervention(s): Limited activity within patient's tolerance;Monitored during session  Home Living Family/patient expects to be discharged to:: Private residence Living Arrangements: Spouse/significant other Available Help at Discharge: Family;Available 24 hours/day Type of Home: House Home Access: Stairs to enter Entrance Stairs-Rails: Right Entrance Stairs-Number of Steps: 2 Home Layout: Two level;Able to live on main level with bedroom/bathroom Home Equipment: Other (comment);Shower seat - built in Additional Comments: walking stick    Prior Function Level of Independence: Independent         Comments: pt reports being completely independent, enjoys doing yard work     Journalist, newspaper   Dominant Hand: Right    Extremity/Trunk Assessment   Upper Extremity Assessment Upper Extremity Assessment: LUE deficits/detail;RUE deficits/detail RUE Deficits / Details: finger-to-nose test WFL LUE Deficits / Details: finger-to-nose test demonstrates mild dysmetria    Lower Extremity Assessment Lower Extremity Assessment: Generalized weakness;LLE deficits/detail LLE Deficits / Details: mild incoordination during heel-to-shin, pt demonstrates difficulty grading motor response noted in heavy foot fall during gait LLE Coordination: decreased gross motor;decreased fine motor    Cervical / Trunk Assessment Cervical / Trunk Assessment: Kyphotic;Other exceptions Cervical / Trunk Exceptions: rounded head, forward shoulders  Communication   Communication: Other (comment) (slightly dysarthric)  Cognition Arousal/Alertness: Awake/alert Behavior During Therapy: WFL for tasks assessed/performed Overall Cognitive Status: Impaired/Different from baseline Area of Impairment: Following commands;Safety/judgement                       Following Commands: Follows multi-step commands with increased time       General Comments: A&Ox4, pt requiring teach back technique (verbalizing back to PT what PT asked pt to do) for following multi-step commands, gets disoriented in  hallway during gait and requires cues to make it back to his room. Pt lacks insight into current deficits and states "I need to get back to yard work this week, spring is here" even though he is having difficulty ambulating.      General Comments      Exercises     Assessment/Plan    PT Assessment Patient needs continued PT services  PT Problem List Decreased strength;Decreased mobility;Decreased safety awareness;Decreased coordination;Decreased activity tolerance;Decreased balance;Decreased knowledge of use of DME;Decreased cognition;Decreased knowledge of precautions       PT Treatment Interventions DME instruction;Therapeutic activities;Gait training;Therapeutic exercise;Patient/family education;Balance training;Stair training;Functional mobility training;Neuromuscular re-education    PT Goals (Current goals can be found in the Care Plan section)  Acute Rehab PT Goals Patient Stated Goal: go home ASAP PT Goal Formulation: With patient Time For Goal Achievement: 08/03/20 Potential to Achieve Goals: Good    Frequency Min 4X/week   Barriers to discharge        Co-evaluation               AM-PAC PT "6 Clicks" Mobility  Outcome Measure Help needed turning from your back to your side while in a flat bed without using bedrails?: A Little Help needed moving from lying on your back to sitting on the side of a flat bed without using bedrails?: A Little Help needed moving to and from a bed to a chair (including a wheelchair)?: A Little Help needed standing up from a chair using your arms (e.g., wheelchair or bedside chair)?: A Little Help needed to walk in hospital room?: A Little Help needed climbing 3-5 steps with a railing? : A Little 6 Click Score: 18    End of Session Equipment Utilized During Treatment: Gait belt Activity Tolerance: Patient tolerated treatment well Patient left: in chair;with call bell/phone  within reach;with chair alarm set;with family/visitor  present Nurse Communication: Mobility status PT Visit Diagnosis: Other abnormalities of gait and mobility (R26.89);Difficulty in walking, not elsewhere classified (R26.2)    Time: 1015-1040 PT Time Calculation (min) (ACUTE ONLY): 25 min   Charges:   PT Evaluation $PT Eval Low Complexity: 1 Low PT Treatments $Gait Training: 8-22 mins        Abner Ardis S, PT Acute Rehabilitation Services Pager [phone]  Office [phone]   [place] E [place]oup 07/20/2020, 11:00 AM

## 2020-07-20 NOTE — Evaluation (Signed)
Occupational Therapy Evaluation Patient Details Name: Richard [name] MRN: [id number] DOB: Jun 07, 1934 Today's Date: 07/20/2020    History of Present Illness 86 yo male presents to ED on 3/12 with x2 episodes of slurred speech, LUE weakness, giat instability that resolved within 15 minutes. MRI brain reveals acute right pontine ischemic infarction measuring 8 mm in size. PMH includes CAD, lung cancer s/p RUE lobectomy 2010 with radiation now in remission, HTN.   Clinical Impression   Pt PTA: Pt living with spouse and reports independence with ADL and mobility. Pt currently with LUE dysmetria.  A/O x4; pt aware of s/s of CVA "BEFAST" and able to recall 3/6 symptoms from PT eval earlier. Pt requires cues for safety at times and pt requires cues to reduce ataxic gait and use LUE > RUE to increase function. Pt performing own ADL- figure 4 at edge of recliner, standing at sink for grooming; sitting on commode for transfer and stepping in/out of walk in shower with no physical assist. Pt supervisionA to minguardA for mobility and supervisionA for ADL.  Pt would benefit from continued OT skilled services. OT following acutely.    Follow Up Recommendations  Outpatient OT (for LUE coordination (neuro OP OT is possible))    Equipment Recommendations  None recommended by OT    Recommendations for Other Services       Precautions / Restrictions Precautions Precautions: Fall Restrictions Weight Bearing Restrictions: No      Mobility Bed Mobility Overal bed mobility: Modified Independent             General bed mobility comments: in recliner pre and post session    Transfers Overall transfer level: Needs assistance Equipment used: None Transfers: Sit to/from Stand Sit to Stand: Min guard         General transfer comment: for safety, slow to rise and steady    Balance Overall balance assessment: Needs assistance;History of Falls Sitting-balance support: No upper extremity  supported;Feet supported Sitting balance-Leahy Scale: Fair Sitting balance - Comments: posterior leaning with LE movement Postural control: Posterior lean Standing balance support: No upper extremity supported;During functional activity Standing balance-Leahy Scale: Fair Standing balance comment: standing at sink for light grooming, slightly steadied by sink                 Standardized Balance Assessment Standardized Balance Assessment : Dynamic Gait Index   Dynamic Gait Index Level Surface: Mild Impairment Change in Gait Speed: Mild Impairment Gait with Horizontal Head Turns: Mild Impairment Gait with Vertical Head Turns: Mild Impairment Gait and Pivot Turn: Mild Impairment Step Over Obstacle: Moderate Impairment Step Around Obstacles: Mild Impairment Steps: Mild Impairment Total Score: 15     ADL either performed or assessed with clinical judgement   ADL Overall ADL's : At baseline                                       General ADL Comments: Pt performing own ADL- figure 4 at edge of recliner, standing at sink for grooming; sitting on commode for transfer and stepping in/out of walk in shower with no physical assist. Pt with mild dysmetria of LUE and continues to require cues to use it more than RUE to increase coordination.     Vision Baseline Vision/History: Wears glasses Wears Glasses: At all times Patient Visual Report: No change from baseline Vision Assessment?: Yes Eye Alignment: Within Functional Limits Ocular  Range of Motion: Within Functional Limits Alignment/Gaze Preference: Within Defined Limits     Perception     Praxis      Pertinent Vitals/Pain Pain Assessment: 0-10 Pain Score: 0-No pain Faces Pain Scale: No hurt Pain Intervention(s): Monitored during session     Hand Dominance Right   Extremity/Trunk Assessment Upper Extremity Assessment Upper Extremity Assessment: LUE deficits/detail RUE Deficits / Details:  finger-to-nose test WFL;  thumb to finger opposition LUE Deficits / Details: finger-to-nose test and finger to thumb opposition demonstrates mild dysmetria LUE Coordination: decreased fine motor   Lower Extremity Assessment Lower Extremity Assessment: Generalized weakness LLE Deficits / Details: mild incoordination during heel-to-shin, pt demonstrates difficulty grading motor response noted in heavy foot fall during gait LLE Coordination: decreased gross motor;decreased fine motor   Cervical / Trunk Assessment Cervical / Trunk Assessment: Kyphotic;Other exceptions Cervical / Trunk Exceptions: rounded head, forward shoulders   Communication Communication Communication: Other (comment) (still has dysarthria)   Cognition Arousal/Alertness: Awake/alert Behavior During Therapy: WFL for tasks assessed/performed Overall Cognitive Status: Within Functional Limits for tasks assessed Area of Impairment: Safety/judgement                       Following Commands: Follows multi-step commands with increased time Safety/Judgement: Decreased awareness of deficits     General Comments: A/O x4; pt aware of s/s of CVA "BEFAST" and able to recall 3/6 symptoms. Pt requires cues for safety at times and pt requires cues to reduce ataxic gait and use LUE > RUE to increase function.   General Comments  Spouse in room and she confirmed that pt is very close to baseline, but is interested in OP OT/PT.    Exercises     Shoulder Instructions      Home Living Family/patient expects to be discharged to:: Private residence Living Arrangements: Spouse/significant other Available Help at Discharge: Family;Available 24 hours/day Type of Home: House Home Access: Stairs to enter [employer] of Steps: 2 Entrance Stairs-Rails: Right Home Layout: Two level;Able to live on main level with bedroom/bathroom     Bathroom Shower/Tub: Occupational psychologist: Standard     Home  Equipment: Other (comment);Shower seat - built in   Additional Comments: walking stick      Prior Functioning/Environment Level of Independence: Independent        Comments: pt reports being completely independent, enjoys doing yard work        OT Problem List: Decreased activity tolerance;Impaired balance (sitting and/or standing);Decreased coordination;Impaired UE functional use      OT Treatment/Interventions: Self-care/ADL training;Therapeutic exercise;DME and/or AE instruction;Therapeutic activities;Patient/family education;Balance training;Energy conservation;Neuromuscular education;Cognitive remediation/compensation    OT Goals(Current goals can be found in the care plan section) Acute Rehab OT Goals Patient Stated Goal: go home ASAP OT Goal Formulation: With patient Time For Goal Achievement: 08/03/20 Potential to Achieve Goals: Good ADL Goals Pt/caregiver will Perform Home Exercise Program: Left upper extremity;Increased strength;With written HEP provided;With theraputty;With Supervision  OT Frequency: Min 2X/week   Barriers to D/C:            Co-evaluation              AM-PAC OT "6 Clicks" Daily Activity     Outcome Measure Help from another person eating meals?: None Help from another person taking care of personal grooming?: A Little Help from another person toileting, which includes using toliet, bedpan, or urinal?: A Little Help from another person bathing (including washing, rinsing, drying)?:  A Little Help from another person to put on and taking off regular upper body clothing?: A Little Help from another person to put on and taking off regular lower body clothing?: A Little 6 Click Score: 19   End of Session Equipment Utilized During Treatment: Gait belt Nurse Communication: Mobility status  Activity Tolerance: Patient tolerated treatment well Patient left: in chair;with call bell/phone within reach;with family/visitor present  OT Visit  Diagnosis: Unsteadiness on feet (R26.81);Muscle weakness (generalized) (M62.81)                Time: 1040-4591 OT Time Calculation (min): 33 min Charges:  OT General Charges $OT Visit: 1 Visit OT Evaluation $OT Eval Moderate Complexity: 1 Mod OT Treatments $Self Care/Home Management : 8-22 mins  Jefferey Pica, OTR/L Acute Rehabilitation Services Pager: [phone] Office: [phone]   Haynes Giannotti C 07/20/2020, 2:16 PM

## 2020-07-20 NOTE — Consult Note (Addendum)
Referring Physician: Dr. Roosevelt Locks    Chief Complaint: Transient spells of slurred speech with left arm numbness  HPI: Richard [name] is an 86 y.o. male with a PMHx of CAD, hypercholesterolemia, HTN and lung cancer (status post RUL lobectomy in 2010, status post radiation therapy, now in remission) who presented to the ED on Saturday with slurred speech and mild left arm weakness that started at 0930 while the patient was driving from [place]. The symptoms resolved after about 15 minutes. After he arrived home with his wife, he then had a second spell of slurred speech beginning at about 12:48 PM. This was accompanied by an unsteady gait and sluggish feeling to his LUE, again lasting for about 15 minutes before resolving. He had no vision loss, headache, sensory numbness or aphasia with the episodes.   MRI brain revealed an acute right pontine ischemic infarction measuring 8 mm in size.   He has no prior history of stroke or TIA.   LSN: 12:48 PM on Saturday tPA Given: No: Symptoms resolved  Past Medical History:  Diagnosis Date  . Coronary artery disease   . Coronary artery stenosis   . Hypercholesteremia   . Hypertension   . lung ca dx'd 07/2008    Past Surgical History:  Procedure Laterality Date  . bilateral inguinal hernia repair    . CORNEAL TRANSPLANT    . Left video-assisted thoracic surgery, mini-  10/24/2008    Family History  Problem Relation Age of Onset  . Healthy Mother   . Heart failure Father   . Rheumatic fever Father   . Healthy Sister   . Healthy Brother    Social History:  reports that he quit smoking about 58 years ago. He has never used smokeless tobacco. He reports current alcohol use. He reports that he does not use drugs.  Allergies:  Allergies  Allergen Reactions  . Aspirin Hives    Medications:  Scheduled: . atorvastatin  80 mg Oral Daily  . brimonidine  1 drop Left Eye Daily  . clopidogrel  75 mg Oral Daily  . dorzolamide-timolol  1 drop Both  Eyes BID  . enoxaparin (LOVENOX) injection  40 mg Subcutaneous Q24H  . fluorometholone  1 drop Both Eyes Daily  . latanoprost  1 drop Both Eyes QHS  . Netarsudil Dimesylate  1 drop Ophthalmic Daily   Continuous:   ROS: No headache, CP, vomiting, SOB or leg edema. Other ROS as per HPI.   Physical Examination: Blood pressure (!) 163/77, pulse 77, temperature 97.7 F (36.5 C), temperature source Tympanic, resp. rate 17, weight 58.6 kg, SpO2 98 %.  HEENT: [place]/AT Lungs: Grossly audible wheezing. No increased WOB.  Ext: No edema  Neurologic Examination: Mental Status: Alert, oriented x 5, thought content appropriate.  Speech fluent with intact naming, comprehension and repetition. Mild dysarthria is noted.  Cranial Nerves: II:  Temporal visual fields intact with no extinction to DSS. PERRL.  III,IV, VI: No ptosis. EOMI. No nystagmus.  V,VII: Facial temp sensation equal bilaterally. Mild facial asymmetry at rest, but smile is symmetric.  VIII: Hearing intact to conversation IX,X: No hypophonia or hoarseness XI: Symmetric shoulder shrug XII: Midline tongue extension  Motor: Right : Upper extremity   5/5    Left:     Upper extremity   5/5  Lower extremity   5/5     Lower extremity   5/5 No pronator drift.  No tremor.  Sensory: Temp and light touch intact throughout, bilaterally Deep Tendon  Reflexes:  Right biceps and brachioradialis 2+ Left biceps 2+, brachioradialis 1+ Right patella 2+, left 1+ 0 achilles bilaterally Cerebellar: Left FNF and H-S with mild ataxia. Right FNF and H-S are normal.   Gait: Deferred  Results for orders placed or performed during the hospital encounter of 07/19/20 (from the past 48 hour(s))  CBC with Differential     Status: Abnormal   Collection Time: 07/19/20  2:24 PM  Result Value Ref Range   WBC 9.1 4.0 - 10.5 K/uL   RBC 4.16 (L) 4.22 - 5.81 MIL/uL   Hemoglobin 12.3 (L) 13.0 - 17.0 g/dL   HCT 37.6 (L) 39.0 - 52.0 %   MCV 90.4 80.0 - 100.0 fL    MCH 29.6 26.0 - 34.0 pg   MCHC 32.7 30.0 - 36.0 g/dL   RDW 15.1 11.5 - 15.5 %   Platelets 358 150 - 400 K/uL   nRBC 0.0 0.0 - 0.2 %   Neutrophils Relative % 73 %   Neutro Abs 6.6 1.7 - 7.7 K/uL   Lymphocytes Relative 10 %   Lymphs Abs 0.9 0.7 - 4.0 K/uL   Monocytes Relative 14 %   Monocytes Absolute 1.3 (H) 0.1 - 1.0 K/uL   Eosinophils Relative 2 %   Eosinophils Absolute 0.2 0.0 - 0.5 K/uL   Basophils Relative 1 %   Basophils Absolute 0.1 0.0 - 0.1 K/uL   Immature Granulocytes 0 %   Abs Immature Granulocytes 0.04 0.00 - 0.07 K/uL    Comment: Performed at [facility], [address] [place], [place] [address]  Basic metabolic panel     Status: Abnormal   Collection Time: 07/19/20  2:24 PM  Result Value Ref Range   Sodium 136 135 - 145 mmol/L   Potassium 5.0 3.5 - 5.1 mmol/L    Comment: HEMOLYSIS AT THIS LEVEL MAY AFFECT RESULT   Chloride 105 98 - 111 mmol/L   CO2 22 22 - 32 mmol/L   Glucose, Bld 93 70 - 99 mg/dL    Comment: Glucose reference range applies only to samples taken after fasting for at least 8 hours.   BUN 19 8 - 23 mg/dL   Creatinine, Ser 1.07 0.61 - 1.24 mg/dL   Calcium 8.4 (L) 8.9 - 10.3 mg/dL   GFR, Estimated >60 >60 mL/min    Comment: (NOTE) Calculated using the CKD-EPI Creatinine Equation (2021)    Anion gap 9 5 - 15    Comment: Performed at [facility] [address], [place], [place] [address]  SARS CORONAVIRUS 2 (TAT 6-24 HRS) Nasopharyngeal Nasopharyngeal Swab     Status: None   Collection Time: 07/19/20  3:10 PM   Specimen: Nasopharyngeal Swab  Result Value Ref Range   SARS Coronavirus 2 NEGATIVE NEGATIVE    Comment: (NOTE) SARS-CoV-2 target nucleic acids are NOT DETECTED.  The SARS-CoV-2 RNA is generally detectable in upper and lower respiratory specimens during the acute phase of infection. Negative results do not preclude SARS-CoV-2 infection, do not rule out co-infections with other pathogens, and should not be used as  the sole basis for treatment or other patient management decisions. Negative results must be combined with clinical observations, patient history, and epidemiological information. The expected result is Negative.  Fact Sheet for Patients: SugarRoll.be  Fact Sheet for Healthcare Providers: https://www.woods-mathews.com/  This test is not yet approved or cleared by the [place] FDA and  has been authorized for detection and/or diagnosis of SARS-CoV-2 by FDA  under an Emergency Use Authorization (EUA). This EUA will remain  in effect (meaning this test can be used) for the duration of the COVID-19 declaration under Se ction 564(b)(1) of the Act, 21 U.S.C. section 360bbb-3(b)(1), unless the authorization is terminated or revoked sooner.  Performed at [facility], [place] [address] [place], [place] [address]    CT Head Wo Contrast  Result Date: 07/19/2020 CLINICAL DATA:  Slurred speech and left arm weakness. EXAM: CT HEAD WITHOUT CONTRAST TECHNIQUE: Contiguous axial images were obtained from the base of the skull through the vertex without intravenous contrast. COMPARISON:  None. FINDINGS: Brain: There is mild cerebral atrophy with widening of the extra-axial spaces and ventricular dilatation. There are areas of decreased attenuation within the white matter tracts of the supratentorial brain, consistent with microvascular disease changes. Small, bilateral chronic basal ganglia lacunar infarcts are seen. Vascular: No hyperdense vessel or unexpected calcification. Skull: Normal. Negative for fracture or focal lesion. Sinuses/Orbits: No acute finding. Other: None. IMPRESSION: Generalized cerebral atrophy without acute intracranial pathology. Electronically Signed   By: Virgina Norfolk M.D.   On: 07/19/2020 14:54   MR ANGIO HEAD WO CONTRAST  Result Date: 07/19/2020 CLINICAL DATA:  Slurred speech and left arm weakness. EXAM: MRI HEAD WITHOUT  CONTRAST MRA HEAD WITHOUT CONTRAST TECHNIQUE: Multiplanar, multiecho pulse sequences of the brain and surrounding structures were obtained without intravenous contrast. Angiographic images of the head were obtained using MRA technique without contrast. COMPARISON:  Head CT 07/19/2020 FINDINGS: MRI HEAD FINDINGS Brain: There is an 8 mm acute infarct in the right paracentral pons. Chronic lacunar infarcts are present in the basal ganglia and thalami bilaterally. Scattered small T2 hyperintensities elsewhere in the cerebral white matter bilaterally are nonspecific but compatible with mild chronic small vessel ischemic disease. There is a partially empty sella. There is mild cerebral atrophy. No intracranial hemorrhage, mass, midline shift, or extra-axial fluid collection is identified. Vascular: Major intracranial vascular flow voids are preserved. Skull and upper cervical spine: Unremarkable bone marrow signal. Sinuses/Orbits: Bilateral cataract extraction. Clear paranasal sinuses. Trace left mastoid effusion. Other: None. MRA HEAD FINDINGS The visualized distal left and dominant vertebral artery is patent. The right vertebral artery is hypoplastic, particularly distal to the PICA origin. Patent SCAs are seen bilaterally. The basilar artery is widely patent. There is a fetal origin of the right PCA. Both PCAs are patent without evidence of a significant proximal stenosis. The internal carotid arteries are widely patent from skull base to carotid termini. ACAs and MCAs are patent without evidence of a proximal branch occlusion or significant proximal stenosis. The left A1 segment is absent. No aneurysm is identified. IMPRESSION: 1. Acute right pontine infarct. 2. Mild chronic small vessel ischemic disease with chronic lacunar infarcts as above. 3. Unremarkable head MRA aside from normal variant anatomy. Electronically Signed   By: Logan Bores M.D.   On: 07/19/2020 18:04   MR BRAIN WO CONTRAST  Result Date:  07/19/2020 CLINICAL DATA:  Slurred speech and left arm weakness. EXAM: MRI HEAD WITHOUT CONTRAST MRA HEAD WITHOUT CONTRAST TECHNIQUE: Multiplanar, multiecho pulse sequences of the brain and surrounding structures were obtained without intravenous contrast. Angiographic images of the head were obtained using MRA technique without contrast. COMPARISON:  Head CT 07/19/2020 FINDINGS: MRI HEAD FINDINGS Brain: There is an 8 mm acute infarct in the right paracentral pons. Chronic lacunar infarcts are present in the basal ganglia and thalami bilaterally. Scattered small T2 hyperintensities elsewhere in the cerebral white matter bilaterally are  nonspecific but compatible with mild chronic small vessel ischemic disease. There is a partially empty sella. There is mild cerebral atrophy. No intracranial hemorrhage, mass, midline shift, or extra-axial fluid collection is identified. Vascular: Major intracranial vascular flow voids are preserved. Skull and upper cervical spine: Unremarkable bone marrow signal. Sinuses/Orbits: Bilateral cataract extraction. Clear paranasal sinuses. Trace left mastoid effusion. Other: None. MRA HEAD FINDINGS The visualized distal left and dominant vertebral artery is patent. The right vertebral artery is hypoplastic, particularly distal to the PICA origin. Patent SCAs are seen bilaterally. The basilar artery is widely patent. There is a fetal origin of the right PCA. Both PCAs are patent without evidence of a significant proximal stenosis. The internal carotid arteries are widely patent from skull base to carotid termini. ACAs and MCAs are patent without evidence of a proximal branch occlusion or significant proximal stenosis. The left A1 segment is absent. No aneurysm is identified. IMPRESSION: 1. Acute right pontine infarct. 2. Mild chronic small vessel ischemic disease with chronic lacunar infarcts as above. 3. Unremarkable head MRA aside from normal variant anatomy. Electronically Signed   By:  Logan Bores M.D.   On: 07/19/2020 18:04    Assessment: 86 y.o. male with acute onset of dysarthria, gait instability and left arm numbness 1. Exam reveals mild dysarthria and left hemiataxia 2. MRI brain reveals an acute right pontine infarct. Mild chronic small vessel ischemic disease and chronic lacunar infarcts are also noted.  3. MRA head: Normal with variant anatomy noted.  4. Stroke Risk Factors - CAD, hypercholesterolemia, HTN and history of lung cancer ( 5. History of ASA allergy (hives)  Recommendations: 1. HgbA1c, fasting lipid panel 2. Carotid ultrasound 3. TTE 4. Cardiac telemetry to evaluate for possible paroxysmal atrial fibrillation 5. May need loop recorder if above work up is inconclusive 6. PT consult, OT consult, Speech consult 7. Continue Plavix 8. Given his advanced age, would decrease daily dose of atorvastatin to 40 mg  9. Permissive HTN x 24 hours 10. Risk factor modification 11. Frequent neuro checks    @Electronically  signed: Dr. Kerney Elbe 07/20/2020, 12:55 AM

## 2020-07-20 NOTE — Progress Notes (Signed)
VASCULAR LAB    Carotid duplex has been performed.  See CV proc for preliminary results.   Leshawn Straka, RVT 07/20/2020, 9:11 AM

## 2020-07-20 NOTE — Progress Notes (Signed)
OT Cancellation Note  Patient Details Name: IDREES QUAM MRN: [id number] DOB: July 16, 1934   Cancelled Treatment:    Reason Eval/Treat Not Completed: Patient at procedure or test/ unavailable (Pt having an [place]. OT to continue to follow for OT eval.)   Jefferey Pica, OTR/L Acute Rehabilitation Services Pager: [phone] Office: [phone]   Alexsus Papadopoulos C 07/20/2020, 8:42 AM

## 2020-07-20 NOTE — Care Management Obs Status (Signed)
[facility] NOTIFICATION   Patient Details  Name: Richard [name] MRN: [id number] Date of Birth: March 20, 1935   Medicare Observation Status Notification Given:  Yes    Carles Collet, RN 07/20/2020, 2:55 PM

## 2020-07-20 NOTE — Discharge Summary (Addendum)
Physician Discharge Summary  Richard [name] WPY:[id number] DOB: [birth year] DOA: 07/19/2020  PCP: Lavone Orn, MD  Admit date: 07/19/2020 Discharge date: 07/20/2020  Time spent: 40 minutes  Recommendations for Outpatient Follow-up:  1. Follow outpatient CBC/CMP 2. Continue DAPT x30 days, after 30 days, continue brilinta alone (if brilinta prohibitively expensive, planning to resume plavix) 3. Follow up with therapy outpatient  4. Restart antihypertensives on 3/15 5. Follow carotid stenosis outpatient  6. Follow outpatient with neurology  7. New diabetes - started on metformin, follow outpatient  8. LDL >70, follow on max dose atorvastatin   Discharge Diagnoses:  Active Problems:   TIA (transient ischemic attack)   Discharge Condition: stable  Diet recommendation: heart healthy   Filed Weights   07/19/20 1920  Weight: 58.6 kg    History of present illness:  Richard [name] is Richard [name] 86 y.o. male with medical history significant of CAD on Plavix, HTN, HLD, remote history of lung CA status post RUL lobectomy in 2010 and status post radiation therapy, now in remission, presented with sudden onset of speech problem and balance issue.  Patient was driving on the hilly road in [place] this morning, suddenly started to feel "hands are heavy and sluggish", had to pull over and switched driver place his wife, at that time wife noticed the patient's speech was slow and slurred.  Patient then felt very tired and slept during the rest of the trip for about an hour and half.  When he arrived at home, he felt he has recovered from the episode but then his neighbor came in and he was showing Richard [name] box to the neighbor and again he felt his left hand very sluggish and speech became slurred again.  Denies any trouble understanding or form sentences, denied any numbness of any of the limbs.  No headache no blurry vision.  No fever chills.Marland Kitchen  He was admitted with Richard [name] right pontine infarct.  He was seen by neurology  and started on DAPT with aspirin and brilinta.  Discharged on 3/13 in stable condition.  See below for additional details  Hospital Course:  Right Pontine Infarct - MRI brain 3/12 with acute R pontine infarct - unremarkable head MRA - carotid US with 1-39% stenosis in R ICA, 60-79% stenosis in L ICA - echo with EF 60-65%, grade II diastolic dysfunction (see report)  - tele with sinus rhythm with ectopy - permissive hypertension - neurology c/s - recommending ASA/brilinta x30 days, then brilinta alone if not cost prohibitive (if cost prohibitive, resume plavix), continue lipitor  - LDL 86, A1c 6.5   Carotid Stenosis 60-79% Stenosis in L ICA Asymptomatic, follow outpatient - conservatively with medical therapy per neurology  T2DM A1c 6.5 (previously 6.3) Will start on metformin daily in setting of stroke   HTN -resume amlodipine on 3/15, permissive hypertension  HLD - goal LDL <70, follow on atorvastatin outpatient   CAD -No acute issue  ?ASA allergy - 50 years ago, maybe not ASA, maybe NSAID?, ?hives - trial today, tolerated dose after several hours monitoring - follow outpatient  Procedures: Carotid US Summary:  Right Carotid: Velocities in the right ICA are consistent with Richard [name] 1-39%  stenosis.   Left Carotid: Velocities in the left ICA are consistent with Richard [name] 60-79%  stenosis.        Higher velocities may be obscured by acoustic shadowing.     *See table(s) above for measurements and observations.   Echo IMPRESSIONS    1. Left ventricular ejection  fraction, by estimation, is 60 to 65%. The  left ventricle has normal function. The left ventricle has no regional  wall motion abnormalities. There is mild concentric left ventricular  hypertrophy. Left ventricular diastolic  parameters are consistent with Grade II diastolic dysfunction  (pseudonormalization). Elevated left atrial pressure. The average left  ventricular global longitudinal strain is -17.8  %.  2. Right ventricular systolic function is normal. The right ventricular  size is normal. There is normal pulmonary artery systolic pressure. The  estimated right ventricular systolic pressure is 76.7 mmHg.  3. Left atrial size was mildly dilated.  4. The mitral valve is grossly normal. Trivial mitral valve  regurgitation. No evidence of mitral stenosis.  5. The aortic valve is tricuspid. There is mild calcification of the  aortic valve. There is mild thickening of the aortic valve. Aortic valve  regurgitation is not visualized. Mild to moderate aortic valve  sclerosis/calcification is present, without any  evidence of aortic stenosis.  6. The inferior vena cava is normal in size with greater than 50%  respiratory variability, suggesting right atrial pressure of 3 mmHg.   Conclusion(s)/Recommendation(s): No intracardiac source of embolism  detected on this transthoracic study. Richard [name] transesophageal echocardiogram is  recommended to exclude cardiac source of embolism if clinically indicated.  Consultations:  neurology  Discharge Exam: Vitals:   07/20/20 0916 07/20/20 1122  BP: (!) 156/91 (!) 145/65  Pulse: 71 (!) 59  Resp: 18 18  Temp: 98.1 F (36.7 C) 98.1 F (36.7 C)  SpO2: 99% 100%   Eager to discharge home Wife at bedside  General: No acute distress. Cardiovascular: Heart sounds show Richard [name] regular rate, and rhythm.  Lungs: Clear to auscultation bilaterally Abdomen: Soft, nontender, nondistended  Neurological: Alert and oriented 3. 5/5 strength throughout.  Sensation intact to light touch.  L ataxia UE >LE Skin: Warm and dry. No rashes or lesions. Extremities: No clubbing or cyanosis. No edema.  Discharge Instructions   Discharge Instructions    Ambulatory referral to Neurology   Complete by: As directed    Follow up in stroke clinic at [facility] with Dr. Leonie [name] in about 4-6 weeks.  If not available, consider Dr. Bess [name], or Dr. Leanor [name].   Ambulatory referral to Occupational Therapy   Complete by: As directed    Ambulatory referral to Physical Therapy   Complete by: As directed    Call MD for:  difficulty breathing, headache or visual disturbances   Complete by: As directed    Call MD for:  extreme fatigue   Complete by: As directed    Call MD for:  hives   Complete by: As directed    Call MD for:  persistant dizziness or light-headedness   Complete by: As directed    Call MD for:  persistant nausea and vomiting   Complete by: As directed    Call MD for:  redness, tenderness, or signs of infection (pain, swelling, redness, odor or green/yellow discharge around incision site)   Complete by: As directed    Call MD for:  severe uncontrolled pain   Complete by: As directed    Call MD for:  temperature >100.4   Complete by: As directed    Diet - low sodium heart healthy   Complete by: As directed    Discharge instructions   Complete by: As directed    You were seen for Dorothey Oetken stroke.  We've started you on aspirin and brilinta.  Take aspirin and brilinta  for 30 days.  Take 180 mg brilinta (two 90 mg pills) tonight around 10 PM, then start 90 mg twice daily tomorrow (07/21/20).  After 30 days, if brilinta is affordable, continue the brilinta alone.  If the brilinta is unaffordable or cost prohibitive, resume the plavix alone.  Continue your atorvastatin.  You meet criteria for diabetes.  We'll start you on metformin for your diabetes.  Please follow outpatient with your PCP regarding continued follow up for diabetes.  Follow up with neurology in about 6 weeks.  Resume your blood pressure medicine on 3/15 (amlodipine).  You have left sided carotid stenosis.  You'll need to follow this up with neurology or your PCP as an outpatient on medical therapy.    You'll need to follow outpatient with physical and occupational therapy.   Return for new, recurrent, or worsening symptoms.  Please ask your PCP to request records  from this hospitalization so they know what was done and what the next steps will be.   Increase activity slowly   Complete by: As directed      Allergies as of 07/20/2020      Reactions   Aspirin Hives      Medication List    STOP taking these medications   clopidogrel 75 MG tablet Commonly known as: PLAVIX     TAKE these medications   amLODipine 5 MG tablet Commonly known as: NORVASC Take 1 tablet (5 mg total) by mouth daily. Start taking on: July 22, 2020 What changed:   See the new instructions.  These instructions start on July 22, 2020. If you are unsure what to do until then, ask your doctor or other care provider.   aspirin 81 MG EC tablet Take 1 tablet (81 mg total) by mouth daily. Swallow whole. Start taking on: July 21, 2020   atorvastatin 80 MG tablet Commonly known as: LIPITOR TAKE 1 TABLET EVERY DAY AT 6 PM. What changed: See the new instructions.   brimonidine 0.2 % ophthalmic solution Commonly known as: ALPHAGAN Place 1 drop into the left eye 2 (two) times daily.   Centrum Adults Tabs Take 1 tablet by mouth daily.   dorzolamide-timolol 22.3-6.8 MG/ML ophthalmic solution Commonly known as: COSOPT Place 1 drop into both eyes 2 (two) times daily.   fluorometholone 0.1 % ophthalmic suspension Commonly known as: FML Place 1 drop into both eyes daily.   latanoprost 0.005 % ophthalmic solution Commonly known as: XALATAN Place 1 drop into both eyes at bedtime.   metFORMIN 500 MG tablet Commonly known as: Glucophage Take 1 tablet (500 mg total) by mouth daily with breakfast.   Netarsudil Dimesylate 0.02 % Soln Place 1 drop into the left eye daily.   ticagrelor 90 MG Tabs tablet Commonly known as: BRILINTA Take 1 tablet (90 mg total) by mouth 2 (two) times daily. (take with aspirin for 30 days - continue brilinta if affordable after 30 days, otherwise, resume plavix after 30 days - see discharge instructions)      Allergies  Allergen  Reactions  . Aspirin Hives    Follow-up Information    [facility] Follow up.   Specialty: Rehabilitation Why: For physical and occupational therapy. They will call you this week to schedule an appointment.  Contact information: [address] [phone] mc [address]       Lavone Orn, MD Follow up.   Specialty: Internal Medicine Contact information: [address] [place] [address] [phone]  Belva Crome, MD .   Specialty: Cardiology Contact information: [phone] [address] [place] [address] [phone]        Garvin Fila, MD Follow up.   Specialties: Neurology, Radiology Contact information: [address] [place] [address] [phone]                The results of significant diagnostics from this hospitalization (including imaging, microbiology, ancillary and laboratory) are listed below for reference.    Significant Diagnostic Studies: CT Head Wo Contrast  Result Date: 07/19/2020 CLINICAL DATA:  Slurred speech and left arm weakness. EXAM: CT HEAD WITHOUT CONTRAST TECHNIQUE: Contiguous axial images were obtained from the base of the skull through the vertex without intravenous contrast. COMPARISON:  None. FINDINGS: Brain: There is mild cerebral atrophy with widening of the extra-axial spaces and ventricular dilatation. There are areas of decreased attenuation within the white matter tracts of the supratentorial brain, consistent with microvascular disease changes. Small, bilateral chronic basal ganglia lacunar infarcts are seen. Vascular: No hyperdense vessel or unexpected calcification. Skull: Normal. Negative for fracture or focal lesion. Sinuses/Orbits: No acute finding. Other: None. IMPRESSION: Generalized cerebral atrophy without acute intracranial pathology. Electronically Signed   By: Virgina Norfolk M.D.    On: 07/19/2020 14:54   MR ANGIO HEAD WO CONTRAST  Result Date: 07/19/2020 CLINICAL DATA:  Slurred speech and left arm weakness. EXAM: MRI HEAD WITHOUT CONTRAST MRA HEAD WITHOUT CONTRAST TECHNIQUE: Multiplanar, multiecho pulse sequences of the brain and surrounding structures were obtained without intravenous contrast. Angiographic images of the head were obtained using MRA technique without contrast. COMPARISON:  Head CT 07/19/2020 FINDINGS: MRI HEAD FINDINGS Brain: There is an 8 mm acute infarct in the right paracentral pons. Chronic lacunar infarcts are present in the basal ganglia and thalami bilaterally. Scattered small T2 hyperintensities elsewhere in the cerebral white matter bilaterally are nonspecific but compatible with mild chronic small vessel ischemic disease. There is Whalen Trompeter partially empty sella. There is mild cerebral atrophy. No intracranial hemorrhage, mass, midline shift, or extra-axial fluid collection is identified. Vascular: Major intracranial vascular flow voids are preserved. Skull and upper cervical spine: Unremarkable bone marrow signal. Sinuses/Orbits: Bilateral cataract extraction. Clear paranasal sinuses. Trace left mastoid effusion. Other: None. MRA HEAD FINDINGS The visualized distal left and dominant vertebral artery is patent. The right vertebral artery is hypoplastic, particularly distal to the PICA origin. Patent SCAs are seen bilaterally. The basilar artery is widely patent. There is Mao Lockner fetal origin of the right PCA. Both PCAs are patent without evidence of Navi Ewton significant proximal stenosis. The internal carotid arteries are widely patent from skull base to carotid termini. ACAs and MCAs are patent without evidence of Vanna Shavers proximal branch occlusion or significant proximal stenosis. The left A1 segment is absent. No aneurysm is identified. IMPRESSION: 1. Acute right pontine infarct. 2. Mild chronic small vessel ischemic disease with chronic lacunar infarcts as above. 3. Unremarkable head  MRA aside from normal variant anatomy. Electronically Signed   By: Logan Bores M.D.   On: 07/19/2020 18:04   MR BRAIN WO CONTRAST  Result Date: 07/19/2020 CLINICAL DATA:  Slurred speech and left arm weakness. EXAM: MRI HEAD WITHOUT CONTRAST MRA HEAD WITHOUT CONTRAST TECHNIQUE: Multiplanar, multiecho pulse sequences of the brain and surrounding structures were obtained without intravenous contrast. Angiographic images of the head were obtained using MRA technique without contrast. COMPARISON:  Head CT 07/19/2020 FINDINGS: MRI HEAD FINDINGS Brain: There is an 8 mm acute infarct in  the right paracentral pons. Chronic lacunar infarcts are present in the basal ganglia and thalami bilaterally. Scattered small T2 hyperintensities elsewhere in the cerebral white matter bilaterally are nonspecific but compatible with mild chronic small vessel ischemic disease. There is Taevin Mcferran partially empty sella. There is mild cerebral atrophy. No intracranial hemorrhage, mass, midline shift, or extra-axial fluid collection is identified. Vascular: Major intracranial vascular flow voids are preserved. Skull and upper cervical spine: Unremarkable bone marrow signal. Sinuses/Orbits: Bilateral cataract extraction. Clear paranasal sinuses. Trace left mastoid effusion. Other: None. MRA HEAD FINDINGS The visualized distal left and dominant vertebral artery is patent. The right vertebral artery is hypoplastic, particularly distal to the PICA origin. Patent SCAs are seen bilaterally. The basilar artery is widely patent. There is Jerome Viglione fetal origin of the right PCA. Both PCAs are patent without evidence of Kila Godina significant proximal stenosis. The internal carotid arteries are widely patent from skull base to carotid termini. ACAs and MCAs are patent without evidence of Pinkney Venard proximal branch occlusion or significant proximal stenosis. The left A1 segment is absent. No aneurysm is identified. IMPRESSION: 1. Acute right pontine infarct. 2. Mild chronic small  vessel ischemic disease with chronic lacunar infarcts as above. 3. Unremarkable head MRA aside from normal variant anatomy. Electronically Signed   By: Logan Bores M.D.   On: 07/19/2020 18:04   ECHOCARDIOGRAM COMPLETE  Result Date: 07/20/2020    ECHOCARDIOGRAM REPORT   Patient Name:   ANKUSH GINTZ Date of Exam: 07/20/2020 Medical Rec #:  [id number]    Height:       70.0 in Accession #:    [id number]   Weight:       129.2 lb Date of Birth:  1934/08/24    BSA:          1.733 m Patient Age:    31 years     BP:           138/68 mmHg Patient Gender: M            HR:           61 bpm. Exam Location:  Inpatient Procedure: 2D Echo, 3D Echo, Cardiac Doppler, Color Doppler and Strain Analysis Indications:    TIA  History:        Patient has no prior history of Echocardiogram examinations.                 CAD, COPD, Arrythmias:LBBB, Signs/Symptoms:Hypoxia; Risk                 Factors:Dyslipidemia.  Sonographer:    S[place] Referring Phys: [id number] [place]  1. Left ventricular ejection fraction, by estimation, is 60 to 65%. The left ventricle has normal function. The left ventricle has no regional wall motion abnormalities. There is mild concentric left ventricular hypertrophy. Left ventricular diastolic parameters are consistent with Grade II diastolic dysfunction (pseudonormalization). Elevated left atrial pressure. The average left ventricular global longitudinal strain is -17.8 %.  2. Right ventricular systolic function is normal. The right ventricular size is normal. There is normal pulmonary artery systolic pressure. The estimated right ventricular systolic pressure is 30.8 mmHg.  3. Left atrial size was mildly dilated.  4. The mitral valve is grossly normal. Trivial mitral valve regurgitation. No evidence of mitral stenosis.  5. The aortic valve is tricuspid. There is mild calcification of the aortic valve. There is mild thickening of the aortic valve. Aortic valve regurgitation is not  visualized. Mild to moderate aortic  valve sclerosis/calcification is present, without any evidence of aortic stenosis.  6. The inferior vena cava is normal in size with greater than 50% respiratory variability, suggesting right atrial pressure of 3 mmHg. Conclusion(s)/Recommendation(s): No intracardiac source of embolism detected on this transthoracic study. Jaydan Chretien transesophageal echocardiogram is recommended to exclude cardiac source of embolism if clinically indicated. FINDINGS  Left Ventricle: Left ventricular ejection fraction, by estimation, is 60 to 65%. The left ventricle has normal function. The left ventricle has no regional wall motion abnormalities. The average left ventricular global longitudinal strain is -17.8 %. The left ventricular internal cavity size was normal in size. There is mild concentric left ventricular hypertrophy. Abnormal (paradoxical) septal motion, consistent with left bundle branch block. Left ventricular diastolic parameters are consistent with  Grade II diastolic dysfunction (pseudonormalization). Elevated left atrial pressure. Right Ventricle: The right ventricular size is normal. No increase in right ventricular wall thickness. Right ventricular systolic function is normal. There is normal pulmonary artery systolic pressure. The tricuspid regurgitant velocity is 2.73 m/s, and  with an assumed right atrial pressure of 3 mmHg, the estimated right ventricular systolic pressure is 75.6 mmHg. Left Atrium: Left atrial size was mildly dilated. Right Atrium: Right atrial size was normal in size. Pericardium: Trivial pericardial effusion is present. Presence of pericardial fat pad. Mitral Valve: The mitral valve is grossly normal. Mild to moderate mitral annular calcification. Trivial mitral valve regurgitation. No evidence of mitral valve stenosis. MV peak gradient, 3.1 mmHg. The mean mitral valve gradient is 1.0 mmHg. Tricuspid Valve: The tricuspid valve is grossly normal. Tricuspid valve  regurgitation is mild . No evidence of tricuspid stenosis. Aortic Valve: The aortic valve is tricuspid. There is mild calcification of the aortic valve. There is mild thickening of the aortic valve. Aortic valve regurgitation is not visualized. Mild to moderate aortic valve sclerosis/calcification is present, without any evidence of aortic stenosis. Aortic valve mean gradient measures 3.0 mmHg. Aortic valve peak gradient measures 5.7 mmHg. Aortic valve area, by VTI measures 2.53 cm. Pulmonic Valve: The pulmonic valve was grossly normal. Pulmonic valve regurgitation is not visualized. No evidence of pulmonic stenosis. Aorta: The aortic root and ascending aorta are structurally normal, with no evidence of dilitation. Venous: The right upper pulmonary vein is normal. The inferior vena cava is normal in size with greater than 50% respiratory variability, suggesting right atrial pressure of 3 mmHg. IAS/Shunts: The atrial septum is grossly normal.  LEFT VENTRICLE PLAX 2D LVIDd:         5.40 cm  Diastology LVIDs:         4.50 cm  LV e' medial:    5.00 cm/s LV PW:         1.20 cm  LV E/e' medial:  15.0 LV IVS:        1.40 cm  LV e' lateral:   8.84 cm/s LVOT diam:     2.40 cm  LV E/e' lateral: 8.5 LV SV:         69 LV SV Index:   40       2D Longitudinal Strain LVOT Area:     4.52 cm 2D Strain GLS Avg:     -17.8 %  RIGHT VENTRICLE RV S prime:     10.00 cm/s TAPSE (M-mode): 3.1 cm LEFT ATRIUM           Index       RIGHT ATRIUM           Index LA diam:  4.10 cm 2.37 cm/m  RA Area:     17.90 cm LA Vol (A4C): 63.9 ml 36.86 ml/m RA Volume:   47.30 ml  27.29 ml/m  AORTIC VALVE                   PULMONIC VALVE AV Area (Vmax):    2.65 cm    PV Vmax:       0.78 m/s AV Area (Vmean):   2.77 cm    PV Vmean:      59.900 cm/s AV Area (VTI):     2.53 cm    PV VTI:        0.202 m AV Vmax:           119.00 cm/s PV Peak grad:  2.4 mmHg AV Vmean:          80.800 cm/s PV Mean grad:  2.0 mmHg AV VTI:            0.272 m AV Peak Grad:       5.7 mmHg AV Mean Grad:      3.0 mmHg LVOT Vmax:         69.80 cm/s LVOT Vmean:        49.500 cm/s LVOT VTI:          0.152 m LVOT/AV VTI ratio: 0.56  AORTA Ao Root diam: 3.80 cm Ao Asc diam:  3.35 cm MITRAL VALVE               TRICUSPID VALVE MV Area (PHT): 2.55 cm    TR Peak grad:   29.8 mmHg MV Area VTI:   2.25 cm    TR Vmax:        273.00 cm/s MV Peak grad:  3.1 mmHg MV Mean grad:  1.0 mmHg    SHUNTS MV Vmax:       0.88 m/s    Systemic VTI:  0.15 m MV Vmean:      55.8 cm/s   Systemic Diam: 2.40 cm MV Decel Time: 298 msec MV E velocity: 74.80 cm/s MV Trayonna Bachmeier velocity: 72.90 cm/s MV E/Dmitry Macomber ratio:  1.03 Eleonore Chiquito MD Electronically signed by Eleonore Chiquito MD Signature Date/Time: 07/20/2020/10:59:11 AM    Final    VAS US CAROTID  Result Date: 07/20/2020 Carotid Arterial Duplex Study Indications:       CVA (right pontine ischemic infarct) Risk Factors:      Hypertension, past history of smoking, coronary artery                    disease. Other Factors:     History of lung cancer with right upper lobectomy, 2010. Comparison Study:  No prior study on file for comparison Performing Technologist: Sharion Dove RVS  Examination Guidelines: Jeannett Dekoning complete evaluation includes B-mode imaging, spectral Doppler, color Doppler, and power Doppler as needed of all accessible portions of each vessel. Bilateral testing is considered an integral part of Shekina Cordell complete examination. Limited examinations for reoccurring indications may be performed as noted.  Right Carotid Findings: +----------+--------+--------+--------+------------------+------------------+           PSV cm/sEDV cm/sStenosisPlaque DescriptionComments           +----------+--------+--------+--------+------------------+------------------+ CCA Prox  93      15                                intimal thickening +----------+--------+--------+--------+------------------+------------------+ CCA Distal106     17  intimal  thickening +----------+--------+--------+--------+------------------+------------------+ ICA Prox  130     32              calcific                             +----------+--------+--------+--------+------------------+------------------+ ICA Distal103     26                                                   +----------+--------+--------+--------+------------------+------------------+ ECA       270     18              calcific                             +----------+--------+--------+--------+------------------+------------------+ +----------+--------+-------+--------+-------------------+           PSV cm/sEDV cmsDescribeArm Pressure (mmHG) +----------+--------+-------+--------+-------------------+ Subclavian212                                        +----------+--------+-------+--------+-------------------+ +---------+--------+--+--------+--+ VertebralPSV cm/s52EDV cm/s11 +---------+--------+--+--------+--+  Left Carotid Findings: +----------+--------+--------+--------+------------------+------------------+           PSV cm/sEDV cm/sStenosisPlaque DescriptionComments           +----------+--------+--------+--------+------------------+------------------+ CCA Prox  72      7                                 intimal thickening +----------+--------+--------+--------+------------------+------------------+ CCA Distal74      9                                 intimal thickening +----------+--------+--------+--------+------------------+------------------+ ICA Prox  475     96      60-79%  calcific          Shadowing          +----------+--------+--------+--------+------------------+------------------+ ICA Distal116     22                                                   +----------+--------+--------+--------+------------------+------------------+ ECA       211     4               calcific                              +----------+--------+--------+--------+------------------+------------------+ +----------+--------+--------+--------+-------------------+           PSV cm/sEDV cm/sDescribeArm Pressure (mmHG) +----------+--------+--------+--------+-------------------+ [id number]                                         +----------+--------+--------+--------+-------------------+ +---------+--------+--+--------+--+ VertebralPSV cm/s58EDV cm/s17 +---------+--------+--+--------+--+   Summary: Right Carotid: Velocities in the right ICA are consistent with Yashua Bracco 1-39% stenosis. Left Carotid: Velocities in the left ICA are consistent with Aadi Bordner 60-79% stenosis.  Higher velocities may be obscured by acoustic shadowing.  *See table(s) above for measurements and observations.  Electronically signed by Antony Contras MD on 07/20/2020 at 12:11:53 PM.    Final     Microbiology: Recent Results (from the past 240 hour(s))  SARS CORONAVIRUS 2 (TAT 6-24 HRS) Nasopharyngeal Nasopharyngeal Swab     Status: None   Collection Time: 07/19/20  3:10 PM   Specimen: Nasopharyngeal Swab  Result Value Ref Range Status   SARS Coronavirus 2 NEGATIVE NEGATIVE Final    Comment: (NOTE) SARS-CoV-2 target nucleic acids are NOT DETECTED.  The SARS-CoV-2 RNA is generally detectable in upper and lower respiratory specimens during the acute phase of infection. Negative results do not preclude SARS-CoV-2 infection, do not rule out co-infections with other pathogens, and should not be used as the sole basis for treatment or other patient management decisions. Negative results must be combined with clinical observations, patient history, and epidemiological information. The expected result is Negative.  Fact Sheet for Patients: SugarRoll.be  Fact Sheet for Healthcare Providers: https://www.woods-mathews.com/  This test is not yet approved or cleared by the [place] FDA and  has  been authorized for detection and/or diagnosis of SARS-CoV-2 by FDA under an Emergency Use Authorization (EUA). This EUA will remain  in effect (meaning this test can be used) for the duration of the COVID-19 declaration under Se ction 564(b)(1) of the Act, 21 U.S.C. section 360bbb-3(b)(1), unless the authorization is terminated or revoked sooner.  Performed at [facility], [place] [address], [place], [place] [address]      Labs: Basic Metabolic Panel: Recent Labs  Lab 07/19/20 1424  NA 136  K 5.0  CL 105  CO2 22  GLUCOSE 93  BUN 19  CREATININE 1.07  CALCIUM 8.4*   Liver Function Tests: No results for input(s): AST, ALT, ALKPHOS, BILITOT, PROT, ALBUMIN in the last 168 hours. No results for input(s): LIPASE, AMYLASE in the last 168 hours. No results for input(s): AMMONIA in the last 168 hours. CBC: Recent Labs  Lab 07/19/20 1424  WBC 9.1  NEUTROABS 6.6  HGB 12.3*  HCT 37.6*  MCV 90.4  PLT 358   Cardiac Enzymes: No results for input(s): CKTOTAL, CKMB, CKMBINDEX, TROPONINI in the last 168 hours. BNP: BNP (last 3 results) No results for input(s): BNP in the last 8760 hours.  ProBNP (last 3 results) No results for input(s): PROBNP in the last 8760 hours.  CBG: No results for input(s): GLUCAP in the last 168 hours.     Signed:  Fayrene Helper MD.  [facility] 07/20/2020, 4:02 PM

## 2020-07-20 NOTE — Plan of Care (Signed)
  Problem: Clinical Measurements: Goal: Will remain free from infection Outcome: Progressing   Problem: Health Behavior/Discharge Planning: Goal: Ability to manage health-related needs will improve Outcome: Progressing   Problem: Education: Goal: Knowledge of General Education information will improve Description: Including pain rating scale, medication(s)/side effects and non-pharmacologic comfort measures Outcome: Progressing

## 2020-07-20 NOTE — Evaluation (Signed)
Speech Language Pathology Evaluation Patient Details Name: Richard [name] MRN: [id number] DOB: Mar 14, 1935 Today's Date: 07/20/2020 Time: 1555-1610 SLP Time Calculation (min) (ACUTE ONLY): 15 min  Problem List:  Patient Active Problem List   Diagnosis Date Noted  . TIA (transient ischemic attack) 07/19/2020  . DOE (dyspnea on exertion) 02/10/2017  . Abnormal CT scan, chest 04/08/2016  . COPD (chronic obstructive pulmonary disease) ([place]) 04/08/2016  . CAD (coronary artery disease), native coronary artery 08/30/2013  . Sinoatrial node dysfunction ([place]) 08/30/2013  . Right bundle branch block 08/30/2013  . Essential hypertension 08/30/2013  . COUGH 11/12/2008  . HYPOXEMIA 11/12/2008  . CARCINOMA, LUNG, NONSMALL CELL 11/08/2008  . Hyperlipidemia LDL goal <70 11/08/2008   Past Medical History:  Past Medical History:  Diagnosis Date  . Coronary artery disease   . Coronary artery stenosis   . Hypercholesteremia   . Hypertension   . lung ca dx'd 07/2008   Past Surgical History:  Past Surgical History:  Procedure Laterality Date  . bilateral inguinal hernia repair    . CORNEAL TRANSPLANT    . Left video-assisted thoracic surgery, mini-  10/24/2008   HPI:  86 yo male presents to ED on 3/12 with x2 episodes of slurred speech, LUE weakness, giat instability that resolved within 15 minutes. MRI brain reveals acute right pontine ischemic infarction measuring 8 mm in size. PMH includes CAD, lung cancer s/p RUE lobectomy 2010 with radiation now in remission, HTN.   Assessment / Plan / Recommendation Clinical Impression  Patient presents with a very mild dysarthria impacting lingual articulation but not impacting overall speech intelligibility even at conversational level. Patient himself, and spouse who was also present, both deny any significant impairments or changes in patient's speech or cognition. Location of patient's CVA would not indicate concern for cognitive or linguistic impairments  and so these areas were not formally assessed. Patient is able to communicate at conversational level without difficulty and without significant impact on speech intelligiblity and so formal speech therapy not indicated.    SLP Assessment  SLP Recommendation/Assessment: Patient does not need any further Speech Lanaguage Pathology Services SLP Visit Diagnosis: Dysarthria and anarthria (R47.1)    Follow Up Recommendations  None    Frequency and Duration   N/A        SLP Evaluation Cognition  Overall Cognitive Status: Within Functional Limits for tasks assessed Orientation Level: Oriented X4       Comprehension  Auditory Comprehension Overall Auditory Comprehension: Appears within functional limits for tasks assessed    Expression Expression Primary Mode of Expression: Verbal Verbal Expression Overall Verbal Expression: Appears within functional limits for tasks assessed Written Expression Dominant Hand: Right   Oral / Motor  Oral Motor/Sensory Function Overall Oral Motor/Sensory Function: Mild impairment Facial ROM: Within Functional Limits Facial Symmetry: Within Functional Limits Facial Strength: Within Functional Limits Facial Sensation: Within Functional Limits Lingual ROM: Within Functional Limits Lingual Symmetry: Within Functional Limits Lingual Strength: Reduced Velum: Within Functional Limits Mandible: Within Functional Limits Motor Speech Overall Motor Speech: Impaired Respiration: Within functional limits Phonation: Normal Resonance: Within functional limits Articulation: Impaired Level of Impairment: Conversation Intelligibility: Intelligible (Patient with very mild dysarthria impacting lingual articulation precision however not impacting overall intelligibility) Motor Planning: Witnin functional limits Motor Speech Errors: Not applicable   [place], MA, [facility]

## 2020-07-20 NOTE — Progress Notes (Addendum)
STROKE TEAM PROGRESS NOTE   INTERVAL HISTORY His wife is at the bedside.  I have personally reviewed history of presenting illness with the patient and his wife and reviewed electronic medical records and imaging films in PACS.  He presented with gait ataxia and some speech difficulties which have mostly improved.  MRI scan shows right paramedian pontine lacunar infarct.  MRI of the brain was unremarkable.  LDL cholesterol is elevated 102 mg percent hemoglobin A1c 6.5.  Patient states he developed hives with aspirin and has not taken it for 40 years but is willing to try low-dose aspirin necessary  Vitals:   07/20/20 0100 07/20/20 0404 07/20/20 0916 07/20/20 1122  BP: (!) 122/55 138/68 (!) 156/91 (!) 145/65  Pulse: 62 74 71 (!) 59  Resp: 19 20 18 18   Temp: 98.1 F (36.7 C) 98 F (36.7 C) 98.1 F (36.7 C) 98.1 F (36.7 C)  TempSrc: Oral Oral Oral Oral  SpO2: 97% 96% 99% 100%  Weight:       CBC:  Recent Labs  Lab 07/19/20 1424  WBC 9.1  NEUTROABS 6.6  HGB 12.3*  HCT 37.6*  MCV 90.4  PLT 381   Basic Metabolic Panel:  Recent Labs  Lab 07/19/20 1424  NA 136  K 5.0  CL 105  CO2 22  GLUCOSE 93  BUN 19  CREATININE 1.07  CALCIUM 8.4*   Lipid Panel:  Recent Labs  Lab 07/20/20 0402  CHOL 157  TRIG 66  HDL 58  CHOLHDL 2.7  VLDL 13  LDLCALC 86   HgbA1c:  Recent Labs  Lab 07/20/20 0402  HGBA1C 6.5*    IMAGING past 24 hours CT Head Wo Contrast Result Date: 07/19/2020  IMPRESSION: Generalized cerebral atrophy without acute intracranial pathology.  MR ANGIO HEAD WO CONTRAST Result Date: 07/19/2020 CLINICAL DATA:  IMPRESSION:  Unremarkable head MRA aside from normal variant anatomy.   MR BRAIN WO CONTRAST Result Date: 07/19/2020 IMPRESSION:  1. Acute right pontine infarct.  2. Mild chronic small vessel ischemic disease with chronic lacunar infarcts   Echo 07/20/2020 1. Left ventricular ejection fraction, by estimation, is 60 to 65%. The  left ventricle has  normal function. The left ventricle has no regional  wall motion abnormalities. There is mild concentric left ventricular  hypertrophy. Left ventricular diastolic  parameters are consistent with Grade II diastolic dysfunction  (pseudonormalization). Elevated left atrial pressure. The average left  ventricular global longitudinal strain is -17.8 %.  2. Right ventricular systolic function is normal. The right ventricular  size is normal. There is normal pulmonary artery systolic pressure. The  estimated right ventricular systolic pressure is 82.9 mmHg.  3. Left atrial size was mildly dilated.  4. The mitral valve is grossly normal. Trivial mitral valve  regurgitation. No evidence of mitral stenosis.  5. The aortic valve is tricuspid. There is mild calcification of the  aortic valve. There is mild thickening of the aortic valve. Aortic valve  regurgitation is not visualized. Mild to moderate aortic valve  sclerosis/calcification is present, without any  evidence of aortic stenosis.  6. The inferior vena cava is normal in size with greater than 50%  respiratory variability, suggesting right atrial pressure of 3 mmHg.   PHYSICAL EXAM Pleasant elderly Caucasian male not in distress. HEENT: /AT Lungs: Grossly audible wheezing. No increased WOB.  Ext: No edema  Neurologic Examination: Mental Status: Alert, oriented x 5, thought content appropriate.  Speech fluent with intact naming, comprehension and repetition. Mild dysarthria.  Cranial Nerves: II:  Temporal visual fields intact with no extinction to DSS. PERRL.  III,IV, VI: No ptosis. EOMI. No nystagmus.  V,VII: Facial temp sensation equal bilaterally. Mild left facial asymmetry at rest, but smile is symmetric.  VIII: Hearing intact to conversation IX,X: No hypophonia or hoarseness XI: Symmetric shoulder shrug XII: Midline tongue extension  Motor: Right :  Upper extremity   5/5                Left:     Upper extremity   4+/5              Lower extremity   5/5                 Lower extremity   5/5 No pronator drift.  No tremor.  Sensory: Temp and light touch intact throughout, bilaterally Cerebellar: Left FNF with mild ataxia. Right FNF normal.   Gait: Deferred   ASSESSMENT/PLAN [name] is a 86 y.o. male with history of CAD, hypercholesterolemia, HTN and lung cancer (status post RUL lobectomy in 2010, status post radiation therapy,now in remission) presented with slurred speech, and gait instability.  Was found to have an acute right pontine lacunar infarction on MRI brain.   Stroke:  right pontine lacunar infarct secondary to small vessel disease.  CT head: Generalized cerebral atrophy without acute intracranial pathology.  MRI: acute right pontine paramedian lacunar infarct  MRA  unremarkable  Carotid Doppler  Left ICA 60-79%  2D Echo 60-65%, mild LVH, grade II diastolic dysfunction, miltral valve regurg  LDL 86  HgbA1c 6.5  VTE prophylaxis -  Lovenox 40mg  bid Diet: heart healthy  clopidogrel 75 mg daily prior to admission, now on aspirin 81 mg daily and Brilinta (ticagrelor) 90 mg bid. Patient developed hives 30 years ago after taking Aspirin.  We would like to see if patient would be able to try Aspirin again while he is in the hospital.  Will monitor patient closely.  If patient tolerates aspirin will recommend Aspirin and Brilinta for 30 days then Brilinta alone.  If cardiologist strongly recommends resuming Plavix after 30 days then it is okay to continue just Plavix.     Therapy recommendations:  Outpatient PT, OT pending  Disposition:  Home with outpatient PT  Follow up with Dr.Lennette Fader in the stroke clinic at [facility] in 4-6 weeks after discharge.   Neurology will sign off.  Pleae call with questions.    Hypertension  Home meds:  Amlodipine 5mg  daily  Stable . Permissive hypertension (OK if < 220/120) but gradually normalize in 5-7 days . Long-term BP goal  normotensive  Hyperlipidemia  Home meds:  Atorvastatin 80mg  daily, resumed in hospital  LDL 86, goal < 70  Continue statin at discharge  Diabetes type II Controlled  Home meds:  none  HgbA1c 6.5, goal < 7.0  CBGs  SSI  Other Stroke Risk Factors  Advanced Age >/= 13   Coronary artery disease: coronary artery stenosis  Other Active Problems  Lung cancer  Asymptomatic 60 to 79% left carotid stenosis-can be followed conservatively with medical therapy  Hospital day # 0 I have personally obtained history,examined this patient, reviewed notes, independently viewed imaging studies, participated in medical decision making and plan of care.ROS completed by me personally and pertinent positives fully documented  I have made any additions or clarifications directly to the above note. Agree with note above.  He presented with some slurred speech and gait ataxia due to pontine  lacunar infarct likely from small vessel disease.  Patient was on Plavix prior to admission and states he has developed hives in the past with aspirin but is willing to try low-dose.  Recommend aspirin 81 mg and Brilinta 90 mg twice daily for a month and then stop aspirin and continue Brilinta if his insurance can afford it otherwise switch back to Plavix 75 mg daily.  Recommend conservative medical follow-up for his asymptomatic moderate left carotid stenosis as an outpatient.  Aggressive risk factor modification.  Greater than 50% time during this 35-minute visit was spent on counseling and coordination of care and discussion with patient, wife and Dr. Florene Glen.  Follow-up as an outpatient stroke clinic in 6 weeks  Antony Contras, MD Medical Director [place] Pager: [phone] 07/20/2020 2:24 PM   To contact Stroke Continuity provider, please refer to [email or web address] After hours, contact General Neurology

## 2020-07-20 NOTE — Progress Notes (Signed)
Neuro NP Prince Rome ask to put in [place] to start 12 hrs from the first dose of ASA to assess allergic reaction.  Brilinta 180mg  PO x1 then 90mg  BID start tonight  Onnie Boer, PharmD, [place], AAHIVP, CPP Infectious Disease Pharmacist 07/20/2020 11:31 AM

## 2020-07-21 DIAGNOSIS — Z85118 Personal history of other malignant neoplasm of bronchus and lung: Secondary | ICD-10-CM | POA: Diagnosis not present

## 2020-07-21 DIAGNOSIS — I2510 Atherosclerotic heart disease of native coronary artery without angina pectoris: Secondary | ICD-10-CM | POA: Diagnosis not present

## 2020-07-21 DIAGNOSIS — I10 Essential (primary) hypertension: Secondary | ICD-10-CM | POA: Diagnosis not present

## 2020-07-21 DIAGNOSIS — Z8546 Personal history of malignant neoplasm of prostate: Secondary | ICD-10-CM | POA: Diagnosis not present

## 2020-07-21 DIAGNOSIS — E7800 Pure hypercholesterolemia, unspecified: Secondary | ICD-10-CM | POA: Diagnosis not present

## 2020-07-22 ENCOUNTER — Telehealth: Payer: Self-pay | Admitting: Interventional Cardiology

## 2020-07-22 NOTE — Telephone Encounter (Signed)
Thanks. Agree with Brillinta and ASA. Also Metformin or Farxiga for blood glucose would be okay.

## 2020-07-22 NOTE — Telephone Encounter (Signed)
Will route to Dr. Smith to make him aware. 

## 2020-07-22 NOTE — Telephone Encounter (Signed)
Spoke with pt and made him aware of information from Dr. Tamala Julian.  Pt appreciative for call.

## 2020-07-22 NOTE — Telephone Encounter (Signed)
Patient called and wanted to leave a message for Dr. Tamala Julian. Message below is verbatim from the patient:   Dr. Tamala Julian, I was in the hospital Saturday and Sunday (3/12-3/13).. My Diagnosis was a small stroke. I would like you to get the medical records from the hospital. A Neurologist wanted me to begin [place] and a baby aspirin instead of Plavix. I have started the Brilinta and experimented with the Aspirin. So far so good.   I have also had a change in my A1C. They wanted to put me on metFORMIN HCl 500 mg. I do not want to start it until I hear from you or Dr. Laurann Montana. I will see Dr. Laurann Montana on Wed 3.16.22 to discuss everything as well.  I also met a good friend of yours, Dr.Powell while I was in the hospital. He is very good. He must have had a good Product manager. ( including you- Ha )  Please call me if you want to discuss more.  Thank you, Arman Bogus

## 2020-07-23 DIAGNOSIS — R7301 Impaired fasting glucose: Secondary | ICD-10-CM | POA: Diagnosis not present

## 2020-07-23 DIAGNOSIS — Z8673 Personal history of transient ischemic attack (TIA), and cerebral infarction without residual deficits: Secondary | ICD-10-CM | POA: Diagnosis not present

## 2020-07-23 DIAGNOSIS — I6522 Occlusion and stenosis of left carotid artery: Secondary | ICD-10-CM | POA: Diagnosis not present

## 2020-07-23 DIAGNOSIS — I10 Essential (primary) hypertension: Secondary | ICD-10-CM | POA: Diagnosis not present

## 2020-07-29 DIAGNOSIS — I6350 Cerebral infarction due to unspecified occlusion or stenosis of unspecified cerebral artery: Secondary | ICD-10-CM

## 2020-08-14 ENCOUNTER — Ambulatory Visit: Payer: Medicare Other | Admitting: Occupational Therapy

## 2020-08-14 ENCOUNTER — Ambulatory Visit: Payer: Medicare Other | Attending: Family Medicine | Admitting: Physical Therapy

## 2020-08-14 ENCOUNTER — Other Ambulatory Visit: Payer: Self-pay

## 2020-08-14 ENCOUNTER — Encounter: Payer: Self-pay | Admitting: Occupational Therapy

## 2020-08-14 ENCOUNTER — Encounter: Payer: Self-pay | Admitting: Physical Therapy

## 2020-08-14 DIAGNOSIS — M6281 Muscle weakness (generalized): Secondary | ICD-10-CM | POA: Insufficient documentation

## 2020-08-14 DIAGNOSIS — R2689 Other abnormalities of gait and mobility: Secondary | ICD-10-CM | POA: Insufficient documentation

## 2020-08-14 DIAGNOSIS — R2681 Unsteadiness on feet: Secondary | ICD-10-CM | POA: Insufficient documentation

## 2020-08-14 NOTE — Patient Instructions (Signed)
Tandem Stance -- PARTIAL TANDEM STANCE - (heel to toe)    Right foot in front of left, heel touching toe both feet "straight ahead". Stand on Foot Triangle of Support with both feet. Balance in this position _30__ seconds. Do with left foot in front of right.     SINGLE LIMB STANCE    Stance: single leg on floor. Raise leg. Hold _10__ seconds. Repeat with other leg. __2_ reps per set, _2__ sets per day, _5__ days per week  Copyright  VHI. All rights reserved.

## 2020-08-14 NOTE — Patient Instructions (Signed)
  Strengthening: Resisted Flexion   Hold tubing with __left___ arm(s) at side. Pull forward and up. Move shoulder through pain-free range of motion. Repeat __10__ times per set.  Do _1-2_ sessions per day , every other day   Strengthening: Resisted Extension   Hold tubing in __left ___ hand(s), arm forward. Pull arm back, elbow straight. Repeat _10___ times per set. Do _1-2___ sessions per day, every other day.   Resisted Horizontal Abduction: Bilateral   Sit or stand, tubing in both hands, arms out in front. Keeping arms straight, pinch shoulder blades together and stretch arms out. Repeat _10___ times per set. Do _1-2___ sessions per day, every other day.   Copyright  VHI. All rights reserved.
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
## 2020-08-15 NOTE — Therapy (Signed)
[facility] [address] [place], [place], [address] Phone: [phone]   Fax:  [phone]  Occupational Therapy Evaluation  Patient Details  Name: Richard [name] MRN: [id number] Date of Birth: 24-Jun-1934 No data recorded  Encounter Date: 08/14/2020   OT End of Session - 08/15/20 0743    Visit Number 1    Number of Visits 1    OT Start Time 0848    OT Stop Time 0930    OT Time Calculation (min) 42 min           Past Medical History:  Diagnosis Date  . Coronary artery disease   . Coronary artery stenosis   . Hypercholesteremia   . Hypertension   . lung ca dx'd 07/2008    Past Surgical History:  Procedure Laterality Date  . bilateral inguinal hernia repair    . CORNEAL TRANSPLANT    . Left video-assisted thoracic surgery, mini-  10/24/2008    There were no vitals filed for this visit.   Subjective Assessment - 08/15/20 0743    Subjective  Pt reports balance is his main problem    Currently in Pain? No/denies             [facility] OT Assessment - 08/14/20 0851      Assessment   Medical Diagnosis Rt Pontine infarct    Onset Date/Surgical Date 07/19/20      Precautions   Precautions Fall      Balance Screen   Has the patient fallen in the past 6 months No    Has the patient had a decrease in activity level because of a fear of falling?  No    Is the patient reluctant to leave their home because of a fear of falling?  No      Home  Environment   Family/patient expects to be discharged to: Private residence    Lives With Spouse      Prior Function   Level of Independence Independent      ADL   ADL comments Pt is modified independent with all basic ADLS      Written Expression   Dominant Hand Right      Vision - History   Visual History Other (comment)   multiple corneal transplants     Vision Assessment   Vision Assessment Vision not tested    Comment Pt denies visual changes       Cognition   Overall Cognitive Status Within Functional Limits for tasks assessed      Sensation   Light Touch Appears Intact      Coordination   9 Hole Peg Test Right;Left    Right 9 Hole Peg Test 27.56    Left 9 Hole Peg Test 29.56      ROM / Strength   AROM / PROM / Strength AROM      AROM   Overall AROM  Within functional limits for tasks performed      Strength   Overall Strength Deficits    Overall Strength Comments RUE 4+/5, LUE proximal 4/5, distal 4+/5      Hand Function   Right Hand Grip (lbs) 65.6    Left Hand Grip (lbs) 63.9                                OT Long Term Goals - 08/15/20 0742      OT  LONG TERM GOAL #1   Title n/a 1 x visit                 Plan - 08/14/20 0930    Clinical Impression Statement 86 yo male presented to ED on 3/12 with x2 episodes of slurred speech, LUE weakness, gait instability that resolved within 15 minutes. MRI brain reveals acute right pontine ischemic infarction measuring 8 mm in size. PMH includes CAD, lung cancer s/p RUE lobectomy 2010 with radiation now in remission, HTN. Pt presents with decreased balance and mild left shoulder weakness proximally. Pt was  issued red theraband exercises for proximal shoulder strength on day of eval. Additional OT visits are not needed at this time, PT will address balance.    OT Occupational Profile and History Problem Focused Assessment - Including review of records relating to presenting problem    Occupational performance deficits (Please refer to evaluation for details): ADL's;IADL's;Leisure;Social Participation    Body Structure / Function / Physical Skills UE functional use;Balance;Strength    Rehab Potential Good    Clinical Decision Making Limited treatment options, no task modification necessary    Comorbidities Affecting Occupational Performance: May have comorbidities impacting occupational performance    Modification or Assistance to Complete Evaluation   No modification of tasks or assist necessary to complete eval    OT Frequency One time visit    OT Duration 12 weeks    OT Treatment/Interventions Self-care/ADL training;Patient/family education;Therapeutic exercise    Plan no further OT visits needed at this time, theraband HEP issued for proximal strength,  PT will address balance.    Consulted and Agree with Plan of Care Patient           Patient will benefit from skilled therapeutic intervention in order to improve the following deficits and impairments:   Body Structure / Function / Physical Skills: UE functional use,Balance,Strength       Visit Diagnosis: Muscle weakness (generalized) - Plan: Ot plan of care cert/re-cert    Problem List Patient Active Problem List   Diagnosis Date Noted  . Right pontine stroke ([place])   . TIA (transient ischemic attack) 07/19/2020  . DOE (dyspnea on exertion) 02/10/2017  . Abnormal CT scan, chest 04/08/2016  . COPD (chronic obstructive pulmonary disease) ([place]) 04/08/2016  . CAD (coronary artery disease), native coronary artery 08/30/2013  . Sinoatrial node dysfunction ([place]) 08/30/2013  . Right bundle branch block 08/30/2013  . Essential hypertension 08/30/2013  . COUGH 11/12/2008  . HYPOXEMIA 11/12/2008  . CARCINOMA, LUNG, NONSMALL CELL 11/08/2008  . Hyperlipidemia LDL goal <70 11/08/2008    Baillie Mohammad 08/15/2020, 7:54 AM Theone Murdoch, OTR/L [phone] Phone: [phone] 7:54 AM 08/15/20 [facility] [address] [place], [place], [address] Phone: [phone]   Fax:  [phone]  Name: Richard [name] MRN: [id number] Date of Birth: 1934/08/14

## 2020-08-19 ENCOUNTER — Telehealth: Payer: Self-pay | Admitting: Emergency Medicine

## 2020-08-19 NOTE — Telephone Encounter (Signed)
Patient called, he is establishing care  Will be seen he said in May 2022.  He was calling to confirm the instructions given for [place].    Patient denied further questions, verbalized understanding and expressed appreciation for the phone call.Richard [name]

## 2020-08-20 ENCOUNTER — Ambulatory Visit: Payer: Medicare Other | Admitting: Physical Therapy

## 2020-08-20 DIAGNOSIS — H4089 Other specified glaucoma: Secondary | ICD-10-CM | POA: Diagnosis not present

## 2020-08-20 DIAGNOSIS — H4051X1 Glaucoma secondary to other eye disorders, right eye, mild stage: Secondary | ICD-10-CM | POA: Diagnosis not present

## 2020-08-20 DIAGNOSIS — H401122 Primary open-angle glaucoma, left eye, moderate stage: Secondary | ICD-10-CM | POA: Diagnosis not present

## 2020-08-20 DIAGNOSIS — Z947 Corneal transplant status: Secondary | ICD-10-CM | POA: Diagnosis not present

## 2020-08-26 ENCOUNTER — Ambulatory Visit: Payer: Medicare Other | Admitting: Physical Therapy

## 2020-08-26 ENCOUNTER — Other Ambulatory Visit: Payer: Self-pay

## 2020-08-26 DIAGNOSIS — R2689 Other abnormalities of gait and mobility: Secondary | ICD-10-CM

## 2020-08-26 DIAGNOSIS — M6281 Muscle weakness (generalized): Secondary | ICD-10-CM | POA: Diagnosis not present

## 2020-08-26 DIAGNOSIS — R2681 Unsteadiness on feet: Secondary | ICD-10-CM

## 2020-08-27 NOTE — Therapy (Signed)
[facility] [address] [place], [place], [address] Phone: [phone]   Fax:  [phone]  Physical Therapy Treatment  Patient Details  Name: Richard [name] MRN: [id number] Date of Birth: [birth year] Referring Provider (PT): Fayrene Helper, Brooke Bonito.  (hospitalist); Dr. Lavone Orn   Encounter Date: 08/26/2020   PT End of Session - 08/27/20 0957    Visit Number 2    Number of Visits 5    Date for PT Re-Evaluation 09/12/20    Authorization Type Medicare    PT Start Time 1450    PT Stop Time 1534    PT Time Calculation (min) 44 min    Equipment Utilized During Treatment Gait belt    Activity Tolerance Patient tolerated treatment well    Behavior During Therapy [facility] for tasks assessed/performed           Past Medical History:  Diagnosis Date  . Coronary artery disease   . Coronary artery stenosis   . Hypercholesteremia   . Hypertension   . lung ca dx'd 07/2008    Past Surgical History:  Procedure Laterality Date  . bilateral inguinal hernia repair    . CORNEAL TRANSPLANT    . Left video-assisted thoracic surgery, mini-  10/24/2008    There were no vitals filed for this visit.   Subjective Assessment - 08/27/20 0906    Subjective Pt reports no changes - states he has been doing the exercises at home but says they are challenging for him (SLS & tandem stance)    Pertinent History COPD, small cell lung carcinoma, DOE, CAD, Rt pontine CVA 07-19-20    Patient Stated Goals Improve balance                                  Balance Exercises - 08/27/20 0001      Balance Exercises: Standing   Standing Eyes Opened Wide (BOA);Head turns;Foam/compliant surface;5 reps   horizontal and vertical - on Airex   Standing Eyes Closed Wide (BOA);Head turns;Foam/compliant surface;5 reps   horizontal and vertical   SLS Eyes open;Solid surface;3 reps   approx. 2.0 - 2.5 secs all 3 trials on RLE   Rockerboard  Anterior/posterior;Head turns;EO;EC;10 reps;Intermittent UE support   horizontal & vertical head turns - with UE support on // bars prn   Other Standing Exercises Pt performed cone taps to 3 cones, standing on floor; then progressed to tipping cone over, standing upright with CGA to improve SLS on each leg          Pt performed tandem walking 10' x 1 rep with CGA for recovery of LOB  Pt performed stepping over/back of balance beam on floor - 10 reps each foot with CGA (no LOB occurred)  Pt performed cone taps to 2 cones - standing on Airex inside // bars for UE support prn - CGA for balance  Pt performed SLS activity - rolling ball forward/back and then side to side 10 reps each direction with each foot for improved SLS on each leg     PT Short Term Goals - 08/15/20 0940      PT SHORT TERM GOAL #1   Title see LTG's             PT Long Term Goals - 08/27/20 1002      PT LONG TERM GOAL #1   Title Independent in HEP for balance exercises.  Time 4    Period Weeks    Status New      PT LONG TERM GOAL #2   Title Pt will subjectively report at least 50% improvement in steadiness upon initial sit to stand transfer.    Time 4    Period Weeks    Status New      PT LONG TERM GOAL #3   Title Pt will improve RLE SLS from 1.97 secs to >/= 4 secs to improve safety and stability with stepping over objects.    Time 4    Period Weeks    Status New      PT LONG TERM GOAL #4   Title Improve FOTO score from 69/100 to >/ 75/100 to demo improvement in status.    Time 4    Period Weeks    Status New                 Plan - 08/27/20 4128    Clinical Impression Statement Pt is progressing well - pt asked at end of session if needed to return; LTG's not fully met but pt was told that it was his decision - pt stated he would come for another session.  Pt has age- related balance deficits but they do not appear to be impacting his function.  Plan D/C next session.    Personal  Factors and Comorbidities Comorbidity 1;Age    Comorbidities COPD, small cell lung carcinoma, Rt pontine CVA on 07-19-20, DOE, CAD    Examination-Activity Limitations Locomotion Level;Squat;Stairs;Transfers    Examination-Participation Restrictions Cleaning;Meal Prep;Community Activity;Shop;Yard Work    Stability/Clinical Decision Making Stable/Uncomplicated    Rehab Potential Good    PT Frequency 1x / week    PT Duration 4 weeks    PT Treatment/Interventions ADLs/Self Care Home Management;Balance training;Gait training;Neuromuscular re-education;Stair training;Therapeutic activities;Therapeutic exercise;Patient/family education    PT Next Visit Plan Juliann Pulse - pt likely to be discharged next session - please check LTG's if he does not want to come for final scheduled session; cont high level balance and gait with vestibular input incorporated    PT Home Exercise Plan SLS and tandem stance    Consulted and Agree with Plan of Care Patient           Patient will benefit from skilled therapeutic intervention in order to improve the following deficits and impairments:  Difficulty walking,Decreased balance  Visit Diagnosis: Other abnormalities of gait and mobility  Unsteadiness on feet     Problem List Patient Active Problem List   Diagnosis Date Noted  . Right pontine stroke ([place])   . TIA (transient ischemic attack) 07/19/2020  . DOE (dyspnea on exertion) 02/10/2017  . Abnormal CT scan, chest 04/08/2016  . COPD (chronic obstructive pulmonary disease) ([place]) 04/08/2016  . CAD (coronary artery disease), native coronary artery 08/30/2013  . Sinoatrial node dysfunction ([place]) 08/30/2013  . Right bundle branch block 08/30/2013  . Essential hypertension 08/30/2013  . COUGH 11/12/2008  . HYPOXEMIA 11/12/2008  . CARCINOMA, LUNG, NONSMALL CELL 11/08/2008  . Hyperlipidemia LDL goal <70 11/08/2008    DildayJenness Corner, PT 08/27/2020, 10:03 AM  [facility] [address] [place], [place], [address] Phone: [phone]   Fax:  [phone]  Name: Richard [name] MRN: [id number] Date of Birth: 08-Dec-1934

## 2020-09-03 ENCOUNTER — Ambulatory Visit: Payer: Medicare Other | Admitting: Physical Therapy

## 2020-09-03 ENCOUNTER — Other Ambulatory Visit: Payer: Self-pay

## 2020-09-03 ENCOUNTER — Encounter: Payer: Self-pay | Admitting: Physical Therapy

## 2020-09-03 DIAGNOSIS — M6281 Muscle weakness (generalized): Secondary | ICD-10-CM

## 2020-09-03 DIAGNOSIS — R2689 Other abnormalities of gait and mobility: Secondary | ICD-10-CM | POA: Diagnosis not present

## 2020-09-03 DIAGNOSIS — R2681 Unsteadiness on feet: Secondary | ICD-10-CM | POA: Diagnosis not present

## 2020-09-03 NOTE — Patient Instructions (Signed)
Access Code: [email or web address] URL: [email or web address] Date: 09/03/2020 Prepared by: Willow Ora  Exercises Romberg Stance Eyes Closed on Foam Pad - 1 x daily - 5 x weekly - 1 sets - 3 reps - 30 hold Wide Stance with Eyes Closed and Head Nods on Foam Pad - 1 x daily - 5 x weekly - 1 sets - 10 reps

## 2020-09-04 NOTE — Therapy (Signed)
[facility] [address] [place], [place], [address] Phone: [phone]   Fax:  [phone]  Physical Therapy Treatment  Patient Details  Name: Richard [name] MRN: [id number] Date of Birth: 24-Nov-1934 Referring Provider (PT): Fayrene Helper, Brooke Bonito.  (hospitalist); Dr. Lavone Orn   Encounter Date: 09/03/2020   PT End of Session - 09/03/20 0934    Visit Number 3    Number of Visits 5    Date for PT Re-Evaluation 09/12/20    Authorization Type Medicare    PT Start Time 0932    PT Stop Time 1013    PT Time Calculation (min) 41 min    Equipment Utilized During Treatment Gait belt    Activity Tolerance Patient tolerated treatment well    Behavior During Therapy [facility] for tasks assessed/performed           Past Medical History:  Diagnosis Date  . Coronary artery disease   . Coronary artery stenosis   . Hypercholesteremia   . Hypertension   . lung ca dx'd 07/2008    Past Surgical History:  Procedure Laterality Date  . bilateral inguinal hernia repair    . CORNEAL TRANSPLANT    . Left video-assisted thoracic surgery, mini-  10/24/2008    There were no vitals filed for this visit.   Subjective Assessment - 09/03/20 0933    Subjective No new complaints. No falls or pain to report. HEP is going well, still challenging him.    Pertinent History COPD, small cell lung carcinoma, DOE, CAD, Rt pontine CVA 07-19-20    Patient Stated Goals Improve balance    Currently in Pain? No/denies                 [facility] Adult PT Treatment/Exercise - 09/03/20 0935      Transfers   Transfers Sit to Stand;Stand to Sit    Sit to Stand 7: Independent    Stand to Sit 7: Independent      Ambulation/Gait   Ambulation/Gait Yes    Ambulation/Gait Assistance 7: Independent    Assistive device None    Gait Pattern Within Functional Limits    Ambulation Surface Level;Indoor      Self-Care   Self-Care Other Self-Care Comments     Other Self-Care Comments  continues to report the "unsteadiness" with initial standing, even when standing up slowly. States it gets better after standing for a few seconds, then moving. Does not feel this has changed since starting PT. Also states this was going on before his most recent CVA No issues noted with standing in session from [employer] has been doing he HEP at home. Is able to leg go for brief amount of time. In session today max hold time without UE support on right LE was 5 seconds, 10 seconds on left in SLS, tandem 10 sec's each foot forward. Performed for 3 reps each/each foot. Min guard assist for balance; Administered FOTO with pt Physical FS score 74 today vs 69 at eval.      Neuro Re-ed    Neuro Re-ed Details  for balance/NMR: gait outdoors on uneven paved surfaces with scanning all directions randomly with no balance loss noted, min guard assist for safety.               Balance Exercises - 09/03/20 0955      Balance Exercises: Standing   Standing Eyes Closed Narrow base of support (BOS);Wide (BOA);Head turns;Foam/compliant surface;Other reps (comment);30 secs;Limitations  Standing Eyes Closed Limitations on airex no UE support: feet together for EC 30 sec's x 3 reps, then feet apart for EC head movements left<>right, up<>down for ~10 reps each. min guard assist for safety with cues on posture. added to HEP.             PT Education - 09/03/20 1006    Education Details added balance ex's to HEP; discussed walking program. Pt to start walking with spouse until fitness center at club ([employer])    [employer]) Educated Patient    Methods Explanation;Demonstration;Tactile cues;Verbal cues    Comprehension Verbalized understanding;Returned demonstration;Verbal cues required;Need further instruction            PT Short Term Goals - 08/15/20 0940      PT SHORT TERM GOAL #1   Title see LTG's             PT Long Term Goals - 09/03/20 1516      PT LONG  TERM GOAL #1   Title Independent in HEP for balance exercises.    Baseline 09/03/20: met with current program and additions made today    Status Achieved      PT LONG TERM GOAL #2   Title Pt will subjectively report at least 50% improvement in steadiness upon initial sit to stand transfer.    Baseline 09/03/20: pt continues to report feeling unsteady upon standing, stating no change with therapy. No objective issues noted throughout session.    Status Not Met      PT LONG TERM GOAL #3   Title Pt will improve RLE SLS from 1.97 secs to >/= 4 secs to improve safety and stability with stepping over objects.    Baseline 09/03/20: met in session today    Status Achieved      PT LONG TERM GOAL #4   Title Improve FOTO score from 69/100 to >/ 75/100 to demo improvement in status.    Baseline 09/03/20: 74/100 scored today, improved just not to goal level    Status Achieved                 Plan - 09/03/20 0934    Clinical Impression Statement Today's skilled session focused on progress toward LTGs for anticipated discharge with all goals partially to fully met. Pt without any questions on HEP, including additons made today. Pt in agreement for discharge today.    Personal Factors and Comorbidities Comorbidity 1;Age    Comorbidities COPD, small cell lung carcinoma, Rt pontine CVA on 07-19-20, DOE, CAD    Examination-Activity Limitations Locomotion Level;Squat;Stairs;Transfers    Examination-Participation Restrictions Cleaning;Meal Prep;Community Activity;Shop;Yard Work    Stability/Clinical Decision Making Stable/Uncomplicated    Rehab Potential Good    PT Frequency 1x / week    PT Duration 4 weeks    PT Treatment/Interventions ADLs/Self Care Home Management;Balance training;Gait training;Neuromuscular re-education;Stair training;Therapeutic activities;Therapeutic exercise;Patient/family education    PT Next Visit Plan discharge per PT plan    PT Home Exercise Plan SLS and tandem stance     Consulted and Agree with Plan of Care Patient           Patient will benefit from skilled therapeutic intervention in order to improve the following deficits and impairments:  Difficulty walking,Decreased balance  Visit Diagnosis: Other abnormalities of gait and mobility  Unsteadiness on feet  Muscle weakness (generalized)     Problem List Patient Active Problem List   Diagnosis Date Noted  . Right pontine stroke ([place])   .  TIA (transient ischemic attack) 07/19/2020  . DOE (dyspnea on exertion) 02/10/2017  . Abnormal CT scan, chest 04/08/2016  . COPD (chronic obstructive pulmonary disease) ([place]) 04/08/2016  . CAD (coronary artery disease), native coronary artery 08/30/2013  . Sinoatrial node dysfunction ([place]) 08/30/2013  . Right bundle branch block 08/30/2013  . Essential hypertension 08/30/2013  . COUGH 11/12/2008  . HYPOXEMIA 11/12/2008  . CARCINOMA, LUNG, NONSMALL CELL 11/08/2008  . Hyperlipidemia LDL goal <70 11/08/2008    Willow Ora, PTA, [facility] [address], [place], [place] [address] [phone] 09/04/20, 3:19 PM   Name: Richard [name] MRN: [id number] Date of Birth: Dec 20, 1934

## 2020-09-05 DIAGNOSIS — Z947 Corneal transplant status: Secondary | ICD-10-CM | POA: Diagnosis not present

## 2020-09-05 DIAGNOSIS — H401122 Primary open-angle glaucoma, left eye, moderate stage: Secondary | ICD-10-CM | POA: Diagnosis not present

## 2020-09-05 DIAGNOSIS — H18513 Endothelial corneal dystrophy, bilateral: Secondary | ICD-10-CM | POA: Diagnosis not present

## 2020-09-09 ENCOUNTER — Ambulatory Visit: Payer: Medicare Other | Admitting: Physical Therapy

## 2020-09-23 ENCOUNTER — Ambulatory Visit (INDEPENDENT_AMBULATORY_CARE_PROVIDER_SITE_OTHER): Payer: Medicare Other | Admitting: Neurology

## 2020-09-23 ENCOUNTER — Encounter: Payer: Self-pay | Admitting: Neurology

## 2020-09-23 VITALS — BP 172/81 | HR 83 | Ht 70.0 in | Wt 138.0 lb

## 2020-09-23 DIAGNOSIS — I6521 Occlusion and stenosis of right carotid artery: Secondary | ICD-10-CM

## 2020-09-23 DIAGNOSIS — I639 Cerebral infarction, unspecified: Secondary | ICD-10-CM | POA: Diagnosis not present

## 2020-09-23 DIAGNOSIS — I6381 Other cerebral infarction due to occlusion or stenosis of small artery: Secondary | ICD-10-CM | POA: Diagnosis not present

## 2020-09-23 MED ORDER — ASPIRIN EC 81 MG PO TBEC: 81.0000 mg | DELAYED_RELEASE_TABLET | Freq: Every day | ORAL | 11 refills | Status: AC

## 2020-09-23 NOTE — Patient Instructions (Signed)
I had a long d/w patient about his recent pontine lacunar stroke,asynptomatic moderate right carotid stenosis, risk for recurrent stroke/TIAs, personally independently reviewed imaging studies and stroke evaluation results and answered questions.  Discontinue Plavix and stay on aspirin 81 mg daily alone for secondary stroke prevention and maintain strict control of hypertension with blood pressure goal below 130/90, diabetes with hemoglobin A1c goal below 6.5% and lipids with LDL cholesterol goal below 70 mg/dL. I also advised the patient to eat a healthy diet with plenty of whole grains, cereals, fruits and vegetables, exercise regularly and maintain ideal body weight check follow-up carotid ultrasound in August to follow his asymptomatic carotid stenosis.  Followup in the future with my nurse practitioner in 6 months or call earlier if necessary.  Stroke Prevention Some medical conditions and behaviors are associated with a higher chance of having a stroke. You can help prevent a stroke by making nutrition, lifestyle, and other changes, including managing any medical conditions you may have. What nutrition changes can be made?  Eat healthy foods. You can do this by: ? Choosing foods high in fiber, such as fresh fruits and vegetables and whole grains. ? Eating at least 5 or more servings of fruits and vegetables a day. Try to fill half of your plate at each meal with fruits and vegetables. ? Choosing lean protein foods, such as lean cuts of meat, poultry without skin, fish, tofu, beans, and nuts. ? Eating low-fat dairy products. ? Avoiding foods that are high in salt (sodium). This can help lower blood pressure. ? Avoiding foods that have saturated fat, trans fat, and cholesterol. This can help prevent high cholesterol. ? Avoiding processed and premade foods.  Follow your health care provider's specific guidelines for losing weight, controlling high blood pressure (hypertension), lowering high  cholesterol, and managing diabetes. These may include: ? Reducing your daily calorie intake. ? Limiting your daily sodium intake to 1,500 milligrams (mg). ? Using only healthy fats for cooking, such as olive oil, canola oil, or sunflower oil. ? Counting your daily carbohydrate intake.   What lifestyle changes can be made?  Maintain a healthy weight. Talk to your health care provider about your ideal weight.  Get at least 30 minutes of moderate physical activity at least 5 days a week. Moderate activity includes brisk walking, biking, and swimming.  Do not use any products that contain nicotine or tobacco, such as cigarettes and e-cigarettes. If you need help quitting, ask your health care provider. It may also be helpful to avoid exposure to secondhand smoke.  Limit alcohol intake to no more than 1 drink a day for nonpregnant women and 2 drinks a day for men. One drink equals 12 oz of beer, 5 oz of wine, or 1 oz of hard liquor.  Stop any illegal drug use.  Avoid taking birth control pills. Talk to your health care provider about the risks of taking birth control pills if: ? You are over 56 years old. ? You smoke. ? You get migraines. ? You have ever had a blood clot. What other changes can be made?  Manage your cholesterol levels. ? Eating a healthy diet is important for preventing high cholesterol. If cholesterol cannot be managed through diet alone, you may also need to take medicines. ? Take any prescribed medicines to control your cholesterol as told by your health care provider.  Manage your diabetes. ? Eating a healthy diet and exercising regularly are important parts of managing your blood sugar. If your  blood sugar cannot be managed through diet and exercise, you may need to take medicines. ? Take any prescribed medicines to control your diabetes as told by your health care provider.  Control your hypertension. ? To reduce your risk of stroke, try to keep your blood pressure  below 130/80. ? Eating a healthy diet and exercising regularly are an important part of controlling your blood pressure. If your blood pressure cannot be managed through diet and exercise, you may need to take medicines. ? Take any prescribed medicines to control hypertension as told by your health care provider. ? Ask your health care provider if you should monitor your blood pressure at home. ? Have your blood pressure checked every year, even if your blood pressure is normal. Blood pressure increases with age and some medical conditions.  Get evaluated for sleep disorders (sleep apnea). Talk to your health care provider about getting a sleep evaluation if you snore a lot or have excessive sleepiness.  Take over-the-counter and prescription medicines only as told by your health care provider. Aspirin or blood thinners (antiplatelets or anticoagulants) may be recommended to reduce your risk of forming blood clots that can lead to stroke.  Make sure that any other medical conditions you have, such as atrial fibrillation or atherosclerosis, are managed. What are the warning signs of a stroke? The warning signs of a stroke can be easily remembered as BEFAST.  B is for balance. Signs include: ? Dizziness. ? Loss of balance or coordination. ? Sudden trouble walking.  E is for eyes. Signs include: ? A sudden change in vision. ? Trouble seeing.  F is for face. Signs include: ? Sudden weakness or numbness of the face. ? The face or eyelid drooping to one side.  A is for arms. Signs include: ? Sudden weakness or numbness of the arm, usually on one side of the body.  S is for speech. Signs include: ? Trouble speaking (aphasia). ? Trouble understanding.  T is for time. ? These symptoms may represent a serious problem that is an emergency. Do not wait to see if the symptoms will go away. Get medical help right away. Call your local emergency services (911 in the [place] Do not drive yourself  to the hospital.  Other signs of stroke may include: ? A sudden, severe headache with no known cause. ? Nausea or vomiting. ? Seizure. Where to find more information For more information, visit:  American Stroke Association: www.strokeassociation.org  National Stroke Association: www.stroke.org Summary  You can prevent a stroke by eating healthy, exercising, not smoking, limiting alcohol intake, and managing any medical conditions you may have.  Do not use any products that contain nicotine or tobacco, such as cigarettes and e-cigarettes. If you need help quitting, ask your health care provider. It may also be helpful to avoid exposure to secondhand smoke.  Remember BEFAST for warning signs of stroke. Get help right away if you or a loved one has any of these signs. This information is not intended to replace advice given to you by your health care provider. Make sure you discuss any questions you have with your health care provider. Document Revised: 04/08/2017 Document Reviewed: 06/01/2016 Elsevier Patient Education  2021 [employer].

## 2020-09-23 NOTE — Progress Notes (Signed)
[facility] [address] [place]. [place] [address] [phone]       OFFICE FOLLOW-UP NOTE  [name] Date of Birth:  June 16, 1934 Medical Record Number:  [id number]   HPI: Richard [name] is a pleasant 86 year old Caucasian male seen today for initial office follow-up visit following hospital admission for stroke.  Is accompanied by his wife.  History is obtained from them and review of electronic medical records and I personally reviewed pertinent imaging films in PACS. He has past medical history of hypertension, hyperlipidemia, coronary artery disease, lung cancer s/p right upper lobectomy in 2010 s/p radiation therapy now in remission.  He presented on 07/20/2020 with sudden onset of slurred speech and mild left arm weakness while driving home from [place].  Symptoms resolved in about 15 minutes.  After he reached home he had a second spell witnessed by his wife in which she had slurred speech as well as unsteady gait and sluggish feeling of the left upper extremity which again lasted for 15 minutes and started resolving by the time he reached the emergency room.  He is not a candidate for tPA due to complete resolution of symptoms.  MRI scan the brain actually showed acute right pontine lacunar infarct about 8 mm in size.  MR angiogram of the brain showed no significant large vessel intracranial stenosis.  LDL cholesterol was 102 mg percent and hemoglobin A1c of 6.5.  Transthoracic echo showed ejection fraction of 60 to 65% without cardiac source of embolism.  Carotid ultrasound showed 60 to 79% left internal carotid artery stenosis which is asymptomatic.  Patient was started on aspirin and Brilinta for 4 weeks which he tolerated well he could not afford Brilinta anymore and is now back on Plavix.  He was actually on Plavix even prior to his admission for stroke.  Patient states is made full recovery and has not had any recurrent stroke or TIA symptoms.  He had a question of  history of allergy to aspirin but he stated that he took aspirin 81 mg for 4 weeks  now and had no hives or any problems whatsoever. ROS:   14 system review of systems is positive for petechiae, imbalance, slurred speech and other systems negative  PMH:  Past Medical History:  Diagnosis Date  . Coronary artery disease   . Coronary artery stenosis   . Hypercholesteremia   . Hypertension   . lung ca dx'd 07/2008  . Stroke [facility]) 07/19/2020    Social History:  Social History   Socioeconomic History  . Marital status: Married    Spouse name: Richard [name]  . Number of children: Not on file  . Years of education: Not on file  . Highest education level: Not on file  Occupational History  . Not on file  Tobacco Use  . Smoking status: Former Smoker    Quit date: 05/10/1962    Years since quitting: 58.4  . Smokeless tobacco: Never Used  Vaping Use  . Vaping Use: Never used  Substance and Sexual Activity  . Alcohol use: Yes  . Drug use: No  . Sexual activity: Not on file  Other Topics Concern  . Not on file  Social History Narrative   Lives with wife in own home   Right handed   Drinks 2-3 cps caffeine daily   Social Determinants of Health   Financial Resource Strain: Not on file  Food Insecurity: Not on file  Transportation Needs: Not on file  Physical Activity: Not  on file  Stress: Not on file  Social Connections: Not on file  Intimate Partner Violence: Not on file    Medications:   Current Outpatient Medications on File Prior to Visit  Medication Sig Dispense Refill  . amLODipine (NORVASC) 5 MG tablet Take 1 tablet (5 mg total) by mouth daily.    Marland Kitchen atorvastatin (LIPITOR) 80 MG tablet TAKE 1 TABLET EVERY DAY AT 6 PM. (Patient taking differently: Take 80 mg by mouth daily.) 90 tablet 3  . dorzolamide-timolol (COSOPT) 22.3-6.8 MG/ML ophthalmic solution Place 1 drop into both eyes 2 (two) times daily.     . fluorometholone (FML) 0.1 % ophthalmic suspension Place 1 drop into both  eyes daily.    Marland Kitchen latanoprost (XALATAN) 0.005 % ophthalmic solution Place 1 drop into both eyes at bedtime.     . Multiple Vitamins-Minerals (CENTRUM ADULTS) TABS Take 1 tablet by mouth daily.    . Netarsudil Dimesylate 0.02 % SOLN Place 1 drop into the left eye daily.    . brimonidine (ALPHAGAN) 0.2 % ophthalmic solution Place 1 drop into the left eye 2 (two) times daily.    . metFORMIN (GLUCOPHAGE) 500 MG tablet Take 1 tablet (500 mg total) by mouth daily with breakfast. 30 tablet 0   No current facility-administered medications on file prior to visit.    Allergies:   Allergies  Allergen Reactions  .  Really     Physical Exam General: Frail elderly pleasant Caucasian male, seated, in no evident distress Head: head normocephalic and atraumatic.  Neck: supple with no carotid or supraclavicular bruits Cardiovascular: regular rate and rhythm, no murmurs Musculoskeletal: no deformity except mild kyphoscoliosis Skin:  no rash/petichiae few scattered ecchymosis noted on forearms. Vascular:  Normal pulses all extremities Vitals:   09/23/20 1006  BP: (!) 172/81  Pulse: 83   Neurologic Exam Mental Status: Awake and fully alert. Oriented to place and time. Recent and remote memory intact. Attention span, concentration and fund of knowledge appropriate. Mood and affect appropriate.  Cranial Nerves: Fundoscopic exam reveals sharp disc margins. Pupils equal, briskly reactive to light. Extraocular movements full without nystagmus. Visual fields full to confrontation. Hearing intact. Facial sensation intact. Face, tongue, palate moves normally and symmetrically.  Motor: Normal bulk and tone. Normal strength in all tested extremity muscles. Sensory.: intact to touch ,pinprick .position and vibratory sensation.  Coordination: Rapid alternating movements normal in all extremities. Finger-to-nose and heel-to-shin performed accurately bilaterally. Gait and Station: Arises from chair without  difficulty. Stance is normal. Gait demonstrates normal stride length and balance .  Not able to heel, toe and tandem walk .  Reflexes: 1+ and symmetric. Toes downgoing.   NIHSS  0 Modified Rankin  1   ASSESSMENT: 86 year old Caucasian male with left pontine lacunar infarct in March 2022 from small vessel disease.  Vascular risk factors of hypertension, hyperlipidemia, age and asymptomatic moderate right carotid stenosis     PLAN: I had a long d/w patient about his recent pontine lacunar stroke,asynptomatic moderate right carotid stenosis, risk for recurrent stroke/TIAs, personally independently reviewed imaging studies and stroke evaluation results and answered questions.  Discontinue Plavix and stay on aspirin 81 mg daily alone for secondary stroke prevention and maintain strict control of hypertension with blood pressure goal below 130/90, diabetes with hemoglobin A1c goal below 6.5% and lipids with LDL cholesterol goal below 70 mg/dL. I also advised the patient to eat a healthy diet with plenty of whole grains, cereals, fruits and vegetables, exercise regularly and maintain  ideal body weight check follow-up carotid ultrasound in August to follow his asymptomatic carotid stenosis.  Followup in the future with my nurse practitioner in 6 months or call earlier if necessary.Greater than 50% of time during this 25 minute visit was spent on counseling,explanation of diagnosis, planning of further management, discussion with patient and family and coordination of care Antony Contras, MD Note: This document was prepared with digital dictation and possible smart phrase technology. Any transcriptional errors that result from this process are unintentional

## 2020-10-01 ENCOUNTER — Telehealth: Payer: Self-pay | Admitting: Interventional Cardiology

## 2020-10-01 DIAGNOSIS — Z23 Encounter for immunization: Secondary | ICD-10-CM | POA: Diagnosis not present

## 2020-10-01 NOTE — Telephone Encounter (Signed)
Patient called to say he had a stroke on March 13th and he had a follow up with a Dr. [name] the neurologist and the Dr wants him to substitute the baby asprin for Plavix. Patient wants to speak with Dr. Tamala Julian to make sure that would be good think for the patient to do. Please advise ( patient ask to be call back after 3:00 today and then 1:00 tomorrow)

## 2020-10-01 NOTE — Telephone Encounter (Signed)
Spoke with Dr. Tamala Julian and he agreed with Dr. Clydene Fake recommendation. Spoke with Richard [name] and made him aware.  Richard [name] appreciative for call.

## 2020-10-21 DIAGNOSIS — Z8673 Personal history of transient ischemic attack (TIA), and cerebral infarction without residual deficits: Secondary | ICD-10-CM | POA: Diagnosis not present

## 2020-10-21 DIAGNOSIS — R7301 Impaired fasting glucose: Secondary | ICD-10-CM | POA: Diagnosis not present

## 2020-11-04 DIAGNOSIS — I2510 Atherosclerotic heart disease of native coronary artery without angina pectoris: Secondary | ICD-10-CM | POA: Diagnosis not present

## 2020-11-04 DIAGNOSIS — E7800 Pure hypercholesterolemia, unspecified: Secondary | ICD-10-CM | POA: Diagnosis not present

## 2020-11-04 DIAGNOSIS — Z8546 Personal history of malignant neoplasm of prostate: Secondary | ICD-10-CM | POA: Diagnosis not present

## 2020-11-04 DIAGNOSIS — I10 Essential (primary) hypertension: Secondary | ICD-10-CM | POA: Diagnosis not present

## 2020-11-04 DIAGNOSIS — Z85118 Personal history of other malignant neoplasm of bronchus and lung: Secondary | ICD-10-CM | POA: Diagnosis not present

## 2020-12-07 IMAGING — DX DG CHEST 2V
2 series · 2 of 2 positions shown · non-contrast
Comparison: 09/21/2019.  CT 10/20/2016.

CLINICAL DATA: Follow-up STAT.

EXAM:
CHEST - 2 VIEW

[chest pa]
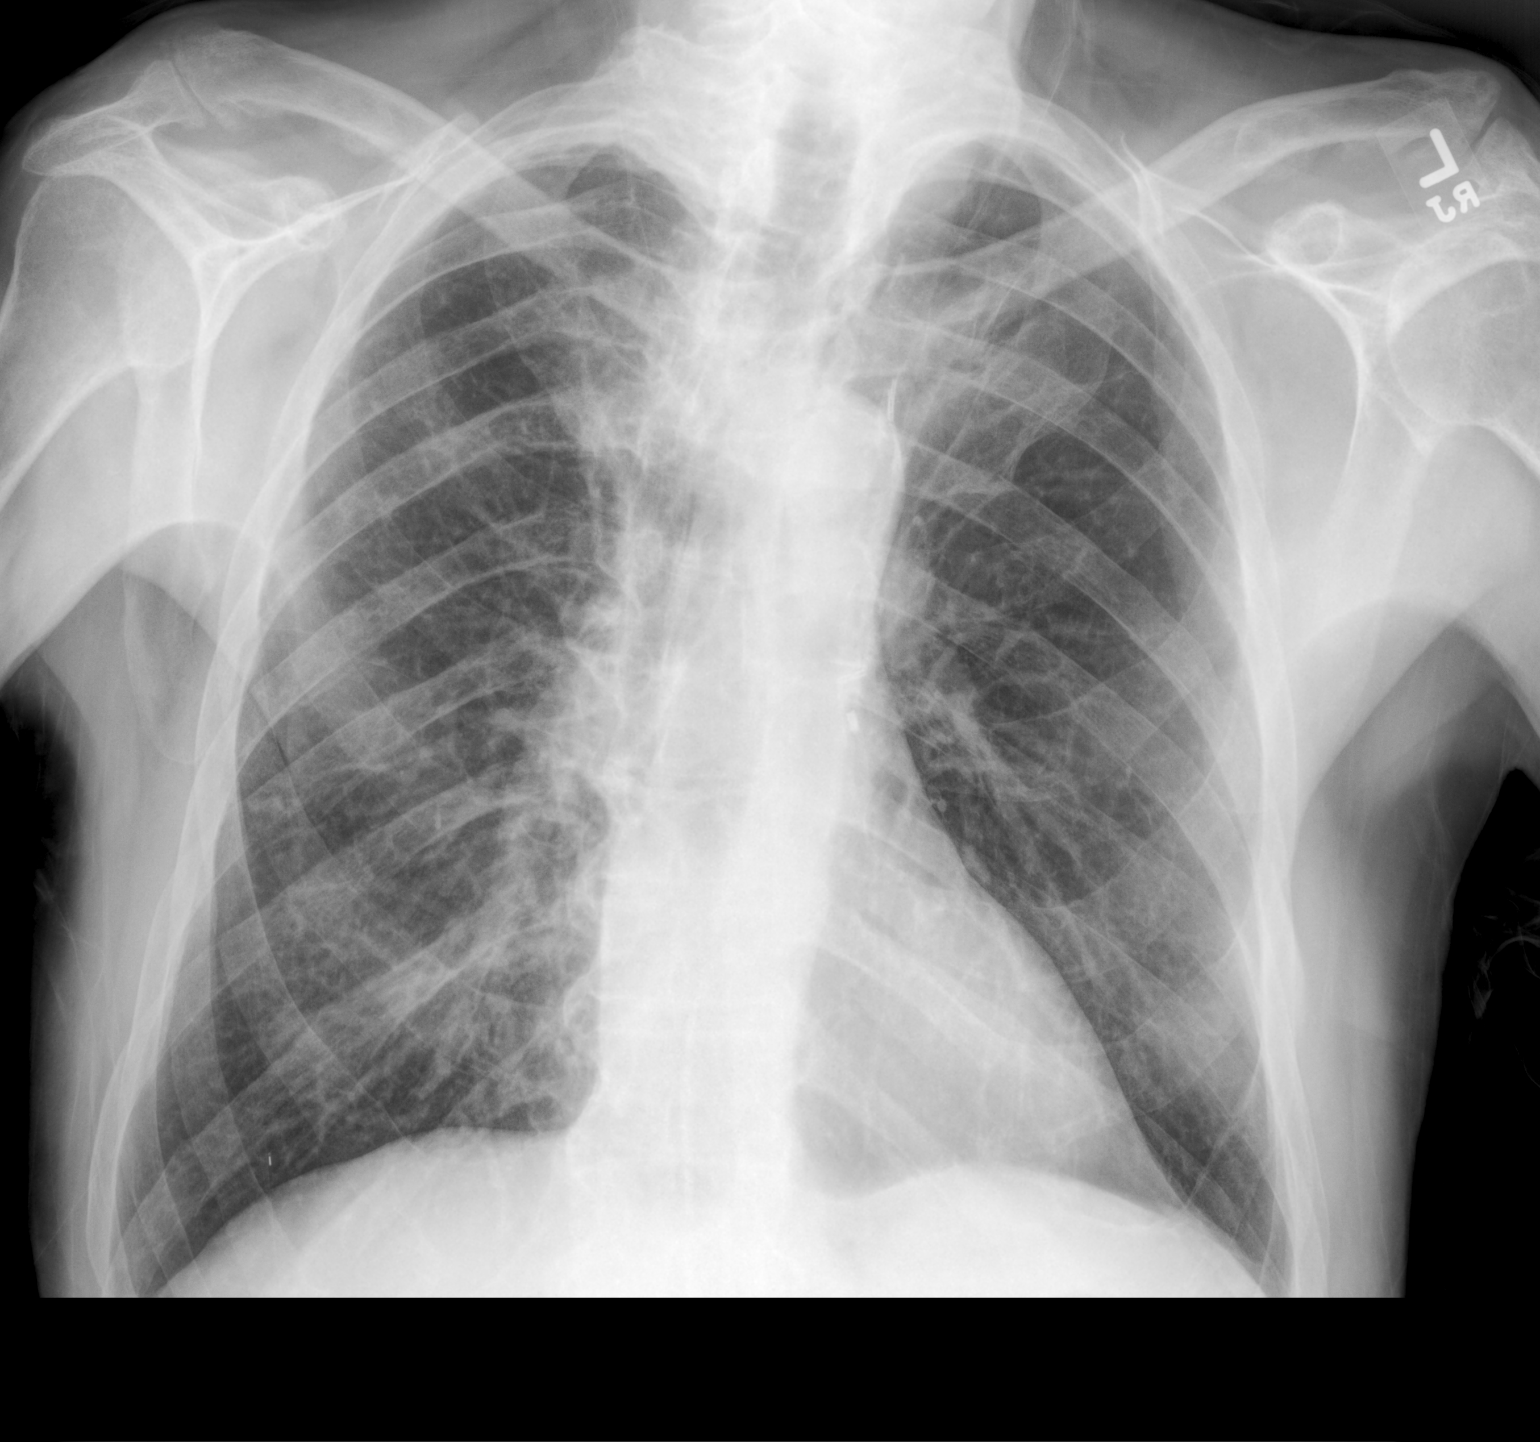

[chest lat]
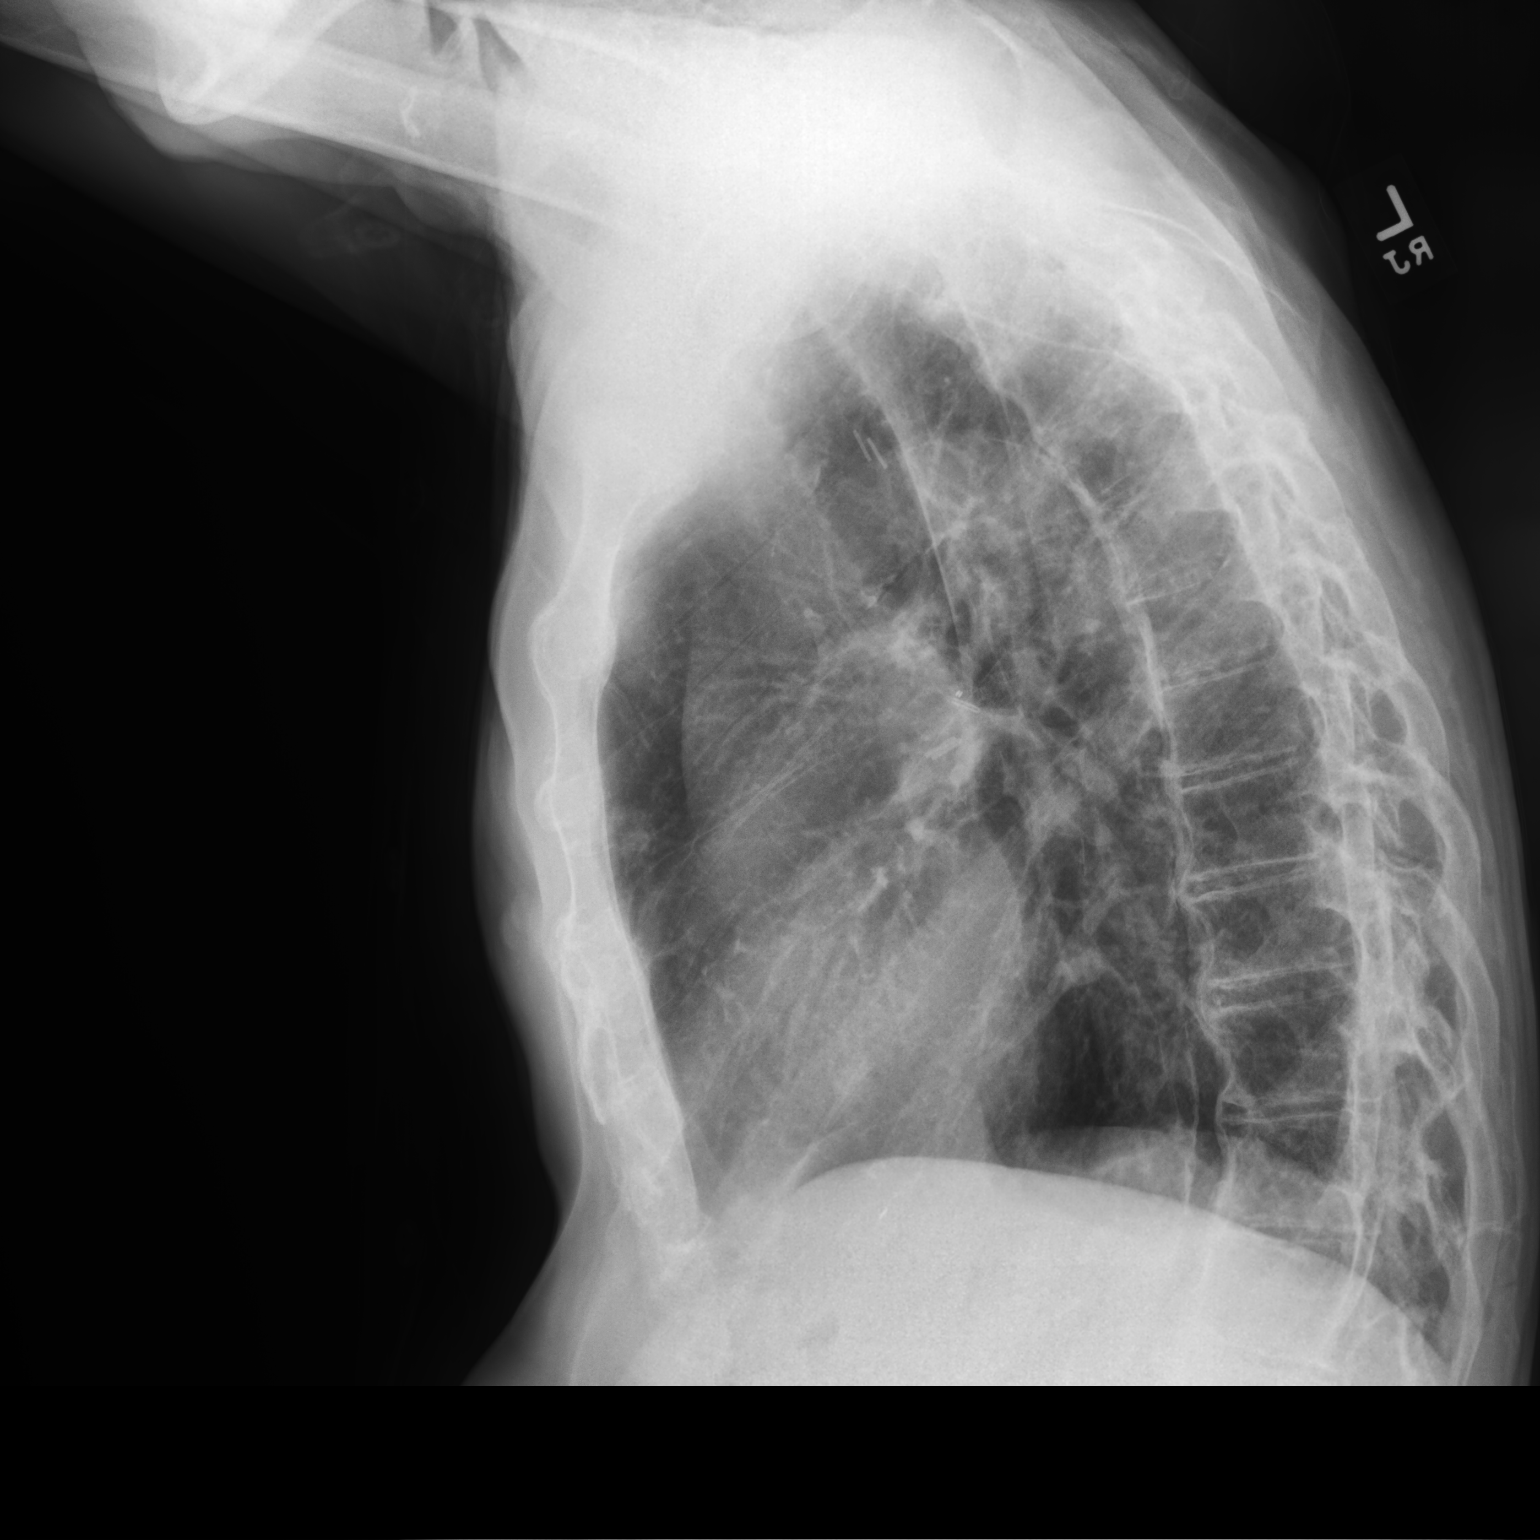

[2 of 2 positions shown; findings below may reference images not displayed]

FINDINGS: Mediastinum and hilar structures stable. Surgical clips left hilum.
Stable biapical pleural-parenchymal thickening consistent with
scarring. Stable mild nodularity consistent prior granulomas
disease. No acute infiltrate. No pleural effusion or pneumothorax.
Heart size normal. Degenerative change thoracic spine.
IMPRESSION: Stable biapical pleural-parenchymal thickening consistent scarring.
Stable nodularity consistent with old granulomas disease. No acute
abnormality identified.

## 2020-12-17 ENCOUNTER — Other Ambulatory Visit: Payer: Self-pay

## 2020-12-17 ENCOUNTER — Ambulatory Visit (HOSPITAL_COMMUNITY)
Admission: RE | Admit: 2020-12-17 | Discharge: 2020-12-17 | Disposition: A | Discharge status: Home / Self Care | Payer: Medicare Other | Source: Ambulatory Visit | Attending: Neurology | Admitting: Neurology

## 2020-12-17 DIAGNOSIS — I6381 Other cerebral infarction due to occlusion or stenosis of small artery: Secondary | ICD-10-CM | POA: Diagnosis not present

## 2020-12-17 DIAGNOSIS — I6521 Occlusion and stenosis of right carotid artery: Secondary | ICD-10-CM

## 2020-12-17 NOTE — Progress Notes (Signed)
Carotid duplex has been completed.   Preliminary results in CV Proc.   Richard [name] 12/17/2020 10:09 AM

## 2020-12-18 ENCOUNTER — Telehealth: Payer: Self-pay | Admitting: *Deleted

## 2020-12-18 ENCOUNTER — Telehealth: Payer: Self-pay | Admitting: Neurology

## 2020-12-18 NOTE — Telephone Encounter (Signed)
  Chart reviewed, was patient was seen by Dr. [name] on Sep 23, 2020 for TIA episode happened on July 20, 2020, slurred speech, left arm weakness, lasting for 15 minutes, MRI showed acute right pontine lacunar infarction, known asymptomatic 60 to 79% left internal carotid artery stenosis, was started on aspirin and Brilinta For 4 weeks, now back on Plavix, and full recovery,  Follow-up ultrasound carotid artery showed stable left internal carotid artery 60 to 79% stenosis, right side is mild less than 39% stenosis,  Keep current medication treatment, will forward results to Dr. [name], he will call only if there is new changes.   Summary: Right Carotid: Velocities in the right ICA are consistent with a 1-39% stenosis.   Left Carotid: Velocities in the left ICA are consistent with a 60-79% stenosis.   Vertebrals: Bilateral vertebral arteries demonstrate antegrade flow.   *See table(s) above for measurements and observations.

## 2020-12-18 NOTE — Telephone Encounter (Signed)
-----   Message from Melvenia Beam, MD sent at 12/17/2020  5:39 PM EDT ----- Sharyn Lull: Carotid ultrasound showed  stable 60 to 79% left internal carotid artery stenosis which is asymptomatic. I would repeat it in 6 months. He has an appointment with Janett Billow in November, If he is doing well I would push out his follow up just a few months to January and have Janett Billow order the repeat carotid ultrasound then if he is ok with it(and jess is ok with that). thanks

## 2020-12-18 NOTE — Telephone Encounter (Signed)
I spoke to the patient and he verbalized understanding of the result below. He would like to keep his current pending appt w/ Jessica on 04/08/21.

## 2020-12-18 NOTE — Telephone Encounter (Signed)
-----   Message from Desmond Lope, RN sent at 12/18/2020  7:16 AM EDT -----  ----- Message ----- From: Interface, [employer] Sent: 12/17/2020  10:11 AM EDT To: Garvin Fila, MD

## 2020-12-18 NOTE — Telephone Encounter (Signed)
Pt has returned the call to [place], Therapist, sports.  He is asking for a call back

## 2020-12-18 NOTE — Telephone Encounter (Signed)
I spoke to the patient's wife on DPR. She verbalized understanding of the findings and will relay the information to the patient. She is unsure if he is comfortable moving his appt to January. She is going to have him call back, if he wants to change the date. Otherwise, he is will to keep the one in November. Provided our number to call back.

## 2020-12-23 DIAGNOSIS — H18513 Endothelial corneal dystrophy, bilateral: Secondary | ICD-10-CM | POA: Diagnosis not present

## 2020-12-23 DIAGNOSIS — Z947 Corneal transplant status: Secondary | ICD-10-CM | POA: Diagnosis not present

## 2020-12-23 DIAGNOSIS — H401122 Primary open-angle glaucoma, left eye, moderate stage: Secondary | ICD-10-CM | POA: Diagnosis not present

## 2020-12-23 DIAGNOSIS — H4051X1 Glaucoma secondary to other eye disorders, right eye, mild stage: Secondary | ICD-10-CM | POA: Diagnosis not present

## 2020-12-25 DIAGNOSIS — E7800 Pure hypercholesterolemia, unspecified: Secondary | ICD-10-CM | POA: Diagnosis not present

## 2020-12-25 DIAGNOSIS — I2510 Atherosclerotic heart disease of native coronary artery without angina pectoris: Secondary | ICD-10-CM | POA: Diagnosis not present

## 2020-12-25 DIAGNOSIS — Z85118 Personal history of other malignant neoplasm of bronchus and lung: Secondary | ICD-10-CM | POA: Diagnosis not present

## 2020-12-25 DIAGNOSIS — I10 Essential (primary) hypertension: Secondary | ICD-10-CM | POA: Diagnosis not present

## 2020-12-25 DIAGNOSIS — Z8546 Personal history of malignant neoplasm of prostate: Secondary | ICD-10-CM | POA: Diagnosis not present

## 2021-03-06 ENCOUNTER — Ambulatory Visit (INDEPENDENT_AMBULATORY_CARE_PROVIDER_SITE_OTHER): Payer: Medicare Other | Admitting: Internal Medicine

## 2021-03-06 ENCOUNTER — Other Ambulatory Visit: Payer: Self-pay

## 2021-03-06 ENCOUNTER — Ambulatory Visit (INDEPENDENT_AMBULATORY_CARE_PROVIDER_SITE_OTHER): Payer: Medicare Other

## 2021-03-06 ENCOUNTER — Encounter: Payer: Self-pay | Admitting: Internal Medicine

## 2021-03-06 DIAGNOSIS — J449 Chronic obstructive pulmonary disease, unspecified: Secondary | ICD-10-CM | POA: Diagnosis not present

## 2021-03-06 DIAGNOSIS — I6381 Other cerebral infarction due to occlusion or stenosis of small artery: Secondary | ICD-10-CM

## 2021-03-06 DIAGNOSIS — R0609 Other forms of dyspnea: Secondary | ICD-10-CM | POA: Diagnosis not present

## 2021-03-06 DIAGNOSIS — R9389 Abnormal findings on diagnostic imaging of other specified body structures: Secondary | ICD-10-CM

## 2021-03-06 NOTE — Progress Notes (Signed)
Subjective:   Patient ID: Richard [name], male    DOB: May 07, 1935    MRN: [id number]    Brief patient profile: 48 yowm , who was referred by Dr. Lavone Orn  in consultation by DeDios 04/08/16  regarding abnormal chest ct scan c/w MAI and unexplained wt loss  With a 15 PY smoking history, quit in 1965, not been diagnosed with copd or asthma, had LUL lobectomy for Lung CA in 07/2008 by Arlyce Dice s adjuvant rx and released to f/u by Mohammed p 5 y neg surveillance  rec  q 6 m f/u      History of Present Illness  10/26/2016  f/u ov/Grantham Hippert re: transition of care/ pt new to me  Chief Complaint  Patient presents with   Follow-up    Breathing good.   no cough/ no sweats/ no fever Appetite poor since original surgery/ baseline wt 160-170  Not limited by breathing from desired activities   rec F/u q 3 m     03/04/2020  f/u ov/Callin Ashe re:  ? MAI  Chief Complaint  Patient presents with   Follow-up    DOE, Lung nodule, doing well, no new issues   Dyspnea:  Not limited by breathing from desired activities  / yardwork ok but  no powerwalking  Cough: none Sleeping: bed is flat / one pillow SABA use: none 02: none Rec Recheck cxr in one year   03/06/2021  f/u ov/Sashia Campas re: ? MAI    rx observation only  Chief Complaint  Patient presents with   Follow-up    Breathing is doing well and no new co's today.    Dyspnea:  mowing grass up to an hour self propelled  Cough: none  Sleeping: fine / flat bed  SABA use: none  02: none  Covid status:   vax x 4    No obvious day to day or daytime variability or assoc excess/ purulent sputum or mucus plugs or hemoptysis or cp or chest tightness, subjective wheeze or overt sinus or hb symptoms.   Sleeping  without nocturnal  or early am exacerbation  of respiratory  c/o's or need for noct saba. Also denies any obvious fluctuation of symptoms with weather or environmental changes or other aggravating or alleviating factors except as outlined above   No  unusual exposure hx or h/o childhood pna/ asthma or knowledge of premature birth.  Current Allergies, Complete Past Medical History, Past Surgical History, Family History, and Social History were reviewed in [employer] record.  ROS  The following are not active complaints unless bolded Hoarseness, sore throat, dysphagia, dental problems, itching, sneezing,  nasal congestion or discharge of excess mucus or purulent secretions, ear ache,   fever, chills, sweats, unintended wt loss or wt gain, classically pleuritic or exertional cp,  orthopnea pnd or arm/hand swelling  or leg swelling, presyncope, palpitations, abdominal pain, anorexia, nausea, vomiting, diarrhea  or change in bowel habits or change in bladder habits, change in stools or change in urine, dysuria, hematuria,  rash, arthralgias, visual complaints, headache, numbness, weakness or ataxia or problems with walking or coordination,  change in mood or  memory.        Current Meds  Medication Sig   amLODipine (NORVASC) 5 MG tablet Take 1 tablet (5 mg total) by mouth daily.   aspirin EC 81 MG tablet Take 1 tablet (81 mg total) by mouth daily. Swallow whole.   atorvastatin (LIPITOR) 80 MG tablet TAKE 1 TABLET EVERY  DAY AT 6 PM. (Patient taking differently: Take 80 mg by mouth daily.)   dorzolamide-timolol (COSOPT) 22.3-6.8 MG/ML ophthalmic solution Place 1 drop into both eyes 2 (two) times daily.    fluorometholone (FML) 0.1 % ophthalmic suspension Place 1 drop into both eyes daily.   latanoprost (XALATAN) 0.005 % ophthalmic solution Place 1 drop into both eyes at bedtime.    Multiple Vitamins-Minerals (CENTRUM ADULTS) TABS Take 1 tablet by mouth daily.   Netarsudil Dimesylate 0.02 % SOLN Place 1 drop into the left eye daily.                      Objective:   Physical Exam   03/06/2021      137   03/04/2020       138  09/21/2019         142  02/27/2019       140  02/22/2018      135  08/23/2017        136   02/10/2017        137  10/26/2016        139   04/08/16 141 lb (64 kg)  12/30/15 139 lb 3.2 oz (63.1 kg)  09/24/14 148 lb 12.8 oz (67.5 kg)   Vital signs reviewed  03/06/2021  - Note at rest 02 sats  100% on RA   General appearance:    pleasant amb thin wm nad    HEENT : pt wearing mask not removed for exam due to covid -19 concerns.    NECK :  without JVD/Nodes/TM/ nl carotid upstrokes bilaterally   LUNGS: no acc muscle use,  Nl contour chest which is clear to A and P bilaterally without cough on insp or exp maneuvers   CV:  RRR  no s3 or murmur or increase in P2, and no edema   ABD:  soft and nontender with nl inspiratory excursion in the supine position. No bruits or organomegaly appreciated, bowel sounds nl  MS:  Nl gait/ ext warm without deformities, calf tenderness, cyanosis or clubbing No obvious joint restrictions   SKIN: warm and dry without lesions    NEURO:  alert, approp, nl sensorium with  no motor or cerebellar deficits apparent.      CXR PA and Lateral:   03/06/2021 :    I personally reviewed images and as  impression as follows:     No acute or interval changes        Assessment & Plan:

## 2021-03-06 NOTE — Patient Instructions (Signed)
Please remember to go to the x-ray department   for your tests - we will call you with the results when they are available.     Please schedule a follow up visit in  12 months but call sooner if needed.    

## 2021-03-07 ENCOUNTER — Encounter: Payer: Self-pay | Admitting: Internal Medicine

## 2021-03-07 NOTE — Assessment & Plan Note (Signed)
CT chest 6/13 /18 1. 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 Overall, there has been mild improvement in the right upper and middle lobe involvement, although there are 2 enlarging focal components in the right upper lobe as well as new clustered nodularity in the left lower lobe. Given the patient's history, continued follow-up should be considered. - ESR 10/26/2016  = 86 with Eos 0.3  Vs   On 02/10/2017  ESR 75 with Eos 0.4  - ESR 02/22/2018 = 65  - 09/21/2019 cxr s changes from priors/ wt loss improved  - 03/06/2021 cxr no change, wt stabilized > rec f/u yearly   No clinical or radiographic progression of dz > observation only rec for now          Each maintenance medication was reviewed in detail including emphasizing most importantly the difference between maintenance and prns and under what circumstances the prns are to be triggered using an action plan format where appropriate.  Total time for H and P, chart review, counseling,   and generating customized AVS unique to this office visit / same day charting =  20 min

## 2021-03-07 NOTE — Assessment & Plan Note (Signed)
Good baseline now, really Not limited by breathing from desired activities and not having any flares so no need for rx.

## 2021-04-08 ENCOUNTER — Encounter: Payer: Self-pay | Admitting: Adult Health

## 2021-04-08 ENCOUNTER — Ambulatory Visit (INDEPENDENT_AMBULATORY_CARE_PROVIDER_SITE_OTHER): Payer: Medicare Other | Admitting: Adult Health

## 2021-04-08 ENCOUNTER — Other Ambulatory Visit: Payer: Self-pay | Admitting: *Deleted

## 2021-04-08 VITALS — BP 137/65 | HR 50 | Ht 69.0 in | Wt 140.0 lb

## 2021-04-08 DIAGNOSIS — I639 Cerebral infarction, unspecified: Secondary | ICD-10-CM | POA: Diagnosis not present

## 2021-04-08 DIAGNOSIS — I6523 Occlusion and stenosis of bilateral carotid arteries: Secondary | ICD-10-CM

## 2021-04-08 DIAGNOSIS — I6521 Occlusion and stenosis of right carotid artery: Secondary | ICD-10-CM

## 2021-04-08 NOTE — Progress Notes (Signed)
[facility] [address] [place]. [place] [address] [phone]       OFFICE FOLLOW-UP NOTE  Richard [name] Date of Birth:  August 12, 1934 Medical Record Number:  [id number]   Primary neurologist: Dr. Leonie [name] Reason for visit: Stroke follow-up  Chief Complaint  Patient presents with   Follow-up    Rm  3 alone Pt is well and stable, doing great. No new concerns      HPI:   Update 04/08/2021 JM: Returns for 84-month stroke follow-up.  Overall stable from stroke standpoint -denies new or reoccurring stroke/TIA symptoms Compliant on aspirin 81mg  daily and atorvastatin 80mg  daily -denies side effects Blood pressure today 137/65 - does not monitor at home as typically stable Appt next month with PCP Dr. Laurann [name] next month  - plans on obtaining lab work  He does question future follow up regarding carotid stenosis No further concerns at this time    History provided for reference purposes only Initial visit 09/23/2020 Dr. Leonie [name]: [name] is a pleasant 86 year old Caucasian male seen today for initial office follow-up visit following hospital admission for stroke.  Is accompanied by his wife.  History is obtained from them and review of electronic medical records and I personally reviewed pertinent imaging films in PACS. He has past medical history of hypertension, hyperlipidemia, coronary artery disease, lung cancer s/p right upper lobectomy in 2010 s/p radiation therapy now in remission.  He presented on 07/20/2020 with sudden onset of slurred speech and mild left arm weakness while driving home from [place].  Symptoms resolved in about 15 minutes.  After he reached home he had a second spell witnessed by his wife in which she had slurred speech as well as unsteady gait and sluggish feeling of the left upper extremity which again lasted for 15 minutes and started resolving by the time he reached the emergency room.  He is not a candidate for tPA due to complete resolution  of symptoms.  MRI scan the brain actually showed acute right pontine lacunar infarct about 8 mm in size.  MR angiogram of the brain showed no significant large vessel intracranial stenosis.  LDL cholesterol was 102 mg percent and hemoglobin A1c of 6.5.  Transthoracic echo showed ejection fraction of 60 to 65% without cardiac source of embolism.  Carotid ultrasound showed 60 to 79% left internal carotid artery stenosis which is asymptomatic.  Patient was started on aspirin and Brilinta for 4 weeks which he tolerated well he could not afford Brilinta anymore and is now back on Plavix.  He was actually on Plavix even prior to his admission for stroke.  Patient states is made full recovery and has not had any recurrent stroke or TIA symptoms.  He had a question of history of allergy to aspirin but he stated that he took aspirin 81 mg for 4 weeks  now and had no hives or any problems whatsoever.   ROS:   14 system review of systems is positive for those listed in HPI and other systems negative  PMH:  Past Medical History:  Diagnosis Date   Coronary artery disease    Coronary artery stenosis    Hypercholesteremia    Hypertension    lung ca dx'd 07/2008   Stroke ([place]) 07/19/2020    Social History:  Social History   Socioeconomic History   Marital status: Married    Spouse name: Richard [name]   Number of children: Not on file   Years of education: Not on file  Highest education level: Not on file  Occupational History   Not on file  Tobacco Use   Smoking status: Former    Types: Cigarettes    Quit date: 05/10/1962    Years since quitting: 58.9   Smokeless tobacco: Never  Vaping Use   Vaping Use: Never used  Substance and Sexual Activity   Alcohol use: Yes   Drug use: No   Sexual activity: Not on file  Other Topics Concern   Not on file  Social History Narrative   Lives with wife in own home   Right handed   Drinks 2-3 cps caffeine daily   Social Determinants of Health   Financial  Resource Strain: Not on file  Food Insecurity: Not on file  Transportation Needs: Not on file  Physical Activity: Not on file  Stress: Not on file  Social Connections: Not on file  Intimate Partner Violence: Not on file    Medications:   Current Outpatient Medications on File Prior to Visit  Medication Sig Dispense Refill   amLODipine (NORVASC) 5 MG tablet Take 1 tablet (5 mg total) by mouth daily.     aspirin EC 81 MG tablet Take 1 tablet (81 mg total) by mouth daily. Swallow whole. 30 tablet 11   atorvastatin (LIPITOR) 80 MG tablet TAKE 1 TABLET EVERY DAY AT 6 PM. (Patient taking differently: Take 80 mg by mouth daily.) 90 tablet 3   dorzolamide-timolol (COSOPT) 22.3-6.8 MG/ML ophthalmic solution Place 1 drop into both eyes 2 (two) times daily.      fluorometholone (FML) 0.1 % ophthalmic suspension Place 1 drop into both eyes daily.     latanoprost (XALATAN) 0.005 % ophthalmic solution Place 1 drop into both eyes at bedtime.      Multiple Vitamins-Minerals (CENTRUM ADULTS) TABS Take 1 tablet by mouth daily.     Netarsudil Dimesylate 0.02 % SOLN Place 1 drop into the left eye daily.     No current facility-administered medications on file prior to visit.    Allergies:   Allergies  Allergen Reactions    Really     Physical Exam Today's Vitals   04/08/21 1034  BP: 137/65  Pulse: (!) 50  Weight: 140 lb (63.5 kg)  Height: 5\' 9"  (1.753 m)   Body mass index is 20.67 kg/m.   General: Frail elderly very pleasant Caucasian male, seated, in no evident distress Head: head normocephalic and atraumatic.  Neck: supple with no carotid or supraclavicular bruits Cardiovascular: regular rate and rhythm, no murmurs Musculoskeletal: no deformity except mild kyphoscoliosis Skin:  no rash/petichiae few scattered ecchymosis noted on forearms. Vascular:  Normal pulses all extremities  Neurologic Exam Mental Status: Awake and fully alert.  Fluent speech and language.  Oriented to place and  time. Recent and remote memory intact. Attention span, concentration and fund of knowledge appropriate. Mood and affect appropriate.  Cranial Nerves: Pupils equal, briskly reactive to light. Extraocular movements full without nystagmus. Visual fields full to confrontation. Hearing intact. Facial sensation intact. Face, tongue, palate moves normally and symmetrically.  Motor: Normal bulk and tone. Normal strength in all tested extremity muscles. Sensory.: intact to touch ,pinprick .position and vibratory sensation.  Coordination: Rapid alternating movements normal in all extremities. Finger-to-nose and heel-to-shin performed accurately bilaterally. Gait and Station: Arises from chair without difficulty. Stance is normal. Gait demonstrates normal stride length and balance without use of assistive device.  Not able to heel, toe and tandem walk .  Reflexes: 1+ and symmetric. Toes downgoing.  ASSESSMENT: 86 year old Caucasian male with left pontine lacunar infarct in March 2022 from small vessel disease recovered well without residual deficits.  Vascular risk factors of hypertension, hyperlipidemia, age and asymptomatic moderate right carotid stenosis    PLAN: -continue on aspirin 81 mg daily and atorvastatin 80 mg daily for secondary stroke prevention  -will reach out to cardiology to see if they can take over monitoring of carotid stenosis otherwise will plan on repeating around 06/2021 (6 mo since prior CUS) -Continue close PCP follow-up for aggressive stroke risk factor management: Maintain strict control of hypertension with blood pressure goal below 130/90 and lipids with LDL cholesterol goal below 70 mg/dL.   As stable from stroke standpoint and routinely followed by PCP and cardiology, okay to follow up as needed per patient request   CC:  Lavone Orn, MD   I spent 26 minutes of face-to-face and non-face-to-face time with patient.  This included previsit chart review, lab review,  study review, electronic health record documentation, patient education and discussion regarding prior stroke, secondary stroke prevention measures and aggressive stroke risk factor management, asymptomatic right carotid stenosis and continued surveillance monitoring and answered all other questions to patient satisfaction  Frann Rider, [facility]  [facility] [address] [place], [place] [address]  Phone [phone] Fax [phone] Note: This document was prepared with digital dictation and possible smart phrase technology. Any transcriptional errors that result from this process are unintentional.

## 2021-04-08 NOTE — Patient Instructions (Signed)
Continue aspirin 81 mg daily  and atorvastatin 80mg  daily  for secondary stroke prevention  Continue to follow up with PCP regarding cholesterol and blood pressure management  Maintain strict control of hypertension with blood pressure goal below 130/90 and cholesterol with LDL cholesterol (bad cholesterol) goal below 70 mg/dL.   Will keep you updated regarding repeat carotid ultrasound in the spring       Thank you for coming to see [place] at [facility]. I hope we have been able to provide you high quality care today.  You may receive a patient satisfaction survey over the next few weeks. We would appreciate your feedback and comments so that we may continue to improve ourselves and the health of our patients.

## 2021-04-15 DIAGNOSIS — Z23 Encounter for immunization: Secondary | ICD-10-CM | POA: Diagnosis not present

## 2021-04-15 DIAGNOSIS — R7301 Impaired fasting glucose: Secondary | ICD-10-CM | POA: Diagnosis not present

## 2021-04-15 DIAGNOSIS — L989 Disorder of the skin and subcutaneous tissue, unspecified: Secondary | ICD-10-CM | POA: Diagnosis not present

## 2021-04-15 DIAGNOSIS — Z8673 Personal history of transient ischemic attack (TIA), and cerebral infarction without residual deficits: Secondary | ICD-10-CM | POA: Diagnosis not present

## 2021-04-15 DIAGNOSIS — I2510 Atherosclerotic heart disease of native coronary artery without angina pectoris: Secondary | ICD-10-CM | POA: Diagnosis not present

## 2021-04-15 DIAGNOSIS — R400 Somnolence: Secondary | ICD-10-CM | POA: Diagnosis not present

## 2021-04-15 DIAGNOSIS — I6522 Occlusion and stenosis of left carotid artery: Secondary | ICD-10-CM | POA: Diagnosis not present

## 2021-04-15 DIAGNOSIS — Z0000 Encounter for general adult medical examination without abnormal findings: Secondary | ICD-10-CM | POA: Diagnosis not present

## 2021-04-15 DIAGNOSIS — E7800 Pure hypercholesterolemia, unspecified: Secondary | ICD-10-CM | POA: Diagnosis not present

## 2021-04-15 DIAGNOSIS — A310 Pulmonary mycobacterial infection: Secondary | ICD-10-CM | POA: Diagnosis not present

## 2021-04-15 DIAGNOSIS — I10 Essential (primary) hypertension: Secondary | ICD-10-CM | POA: Diagnosis not present

## 2021-04-15 DIAGNOSIS — Z1389 Encounter for screening for other disorder: Secondary | ICD-10-CM | POA: Diagnosis not present

## 2021-05-05 ENCOUNTER — Other Ambulatory Visit: Payer: Self-pay | Admitting: *Deleted

## 2021-05-05 MED ORDER — AMLODIPINE BESYLATE 5 MG PO TABS: 5.0000 mg | ORAL_TABLET | Freq: Every day | ORAL | 0 refills | Status: DC

## 2021-05-28 ENCOUNTER — Other Ambulatory Visit: Payer: Self-pay | Admitting: Interventional Cardiology

## 2021-06-23 DIAGNOSIS — H401122 Primary open-angle glaucoma, left eye, moderate stage: Secondary | ICD-10-CM | POA: Diagnosis not present

## 2021-06-23 DIAGNOSIS — Z947 Corneal transplant status: Secondary | ICD-10-CM | POA: Diagnosis not present

## 2021-06-23 DIAGNOSIS — H18513 Endothelial corneal dystrophy, bilateral: Secondary | ICD-10-CM | POA: Diagnosis not present

## 2021-06-23 DIAGNOSIS — H4051X1 Glaucoma secondary to other eye disorders, right eye, mild stage: Secondary | ICD-10-CM | POA: Diagnosis not present

## 2021-06-30 DIAGNOSIS — C4442 Squamous cell carcinoma of skin of scalp and neck: Secondary | ICD-10-CM | POA: Diagnosis not present

## 2021-06-30 DIAGNOSIS — C44629 Squamous cell carcinoma of skin of left upper limb, including shoulder: Secondary | ICD-10-CM | POA: Diagnosis not present

## 2021-06-30 DIAGNOSIS — C44212 Basal cell carcinoma of skin of right ear and external auricular canal: Secondary | ICD-10-CM | POA: Diagnosis not present

## 2021-06-30 DIAGNOSIS — D1801 Hemangioma of skin and subcutaneous tissue: Secondary | ICD-10-CM | POA: Diagnosis not present

## 2021-06-30 DIAGNOSIS — Z85828 Personal history of other malignant neoplasm of skin: Secondary | ICD-10-CM | POA: Diagnosis not present

## 2021-06-30 DIAGNOSIS — L570 Actinic keratosis: Secondary | ICD-10-CM | POA: Diagnosis not present

## 2021-07-02 NOTE — Progress Notes (Signed)
Cardiology Office Note:    Date:  07/03/2021   ID:  Richard [name], DOB 1935/04/06, MRN [id number]  PCP:  Lavone Orn, MD  Cardiologist:  Sinclair Grooms, MD   Referring MD: Lavone Orn, MD   Chief Complaint  Patient presents with   Coronary Artery Disease    History of Present Illness:    Richard [name] is a 87 y.o. male with a hx of coronary artery disease with known chronic total occlusion of the right coronary collateralized from the left and moderate diagonal disease in 2003, sinus node dysfunction, right bundle branch block, hypertension, and fatigue   Has had a TIA since last being seen.  This occurred last March.  Some difficulty speaking at that time.  Work-up for TIA/stroke reveals 60% right carotid.  No cardiac symptoms of syncope, angina, palpitations, orthopnea, or PND.  Past Medical History:  Diagnosis Date   Coronary artery disease    Coronary artery stenosis    Hypercholesteremia    Hypertension    lung ca dx'd 07/2008   Stroke ([place]) 07/19/2020    Past Surgical History:  Procedure Laterality Date   bilateral inguinal hernia repair     CORNEAL TRANSPLANT     Left video-assisted thoracic surgery, mini-  10/24/2008    Current Medications: Current Meds  Medication Sig   amLODipine (NORVASC) 5 MG tablet Take 1 tablet (5 mg total) by mouth daily.   aspirin EC 81 MG tablet Take 1 tablet (81 mg total) by mouth daily. Swallow whole.   atorvastatin (LIPITOR) 80 MG tablet Take 1 tablet (80 mg total) by mouth daily. Please keep upcoming appt. In Feb. With Dr. Tamala Julian in order to receive further refills. Thank You.   dorzolamide-timolol (COSOPT) 22.3-6.8 MG/ML ophthalmic solution Place 1 drop into both eyes 2 (two) times daily.    ezetimibe (ZETIA) 10 MG tablet Take 1 tablet by mouth daily.   fluorometholone (FML) 0.1 % ophthalmic suspension Place 1 drop into both eyes daily.   latanoprost (XALATAN) 0.005 % ophthalmic solution Place 1 drop into both eyes at  bedtime.    Multiple Vitamins-Minerals (CENTRUM ADULTS) TABS Take 1 tablet by mouth daily.   Netarsudil Dimesylate 0.02 % SOLN Place 1 drop into the left eye daily.     Allergies:   Aspirin   Social History   Socioeconomic History   Marital status: Married    Spouse name: Izora Gala   Number of children: Not on file   Years of education: Not on file   Highest education level: Not on file  Occupational History   Not on file  Tobacco Use   Smoking status: Former    Types: Cigarettes    Quit date: 05/10/1962    Years since quitting: 59.1   Smokeless tobacco: Never  Vaping Use   Vaping Use: Never used  Substance and Sexual Activity   Alcohol use: Yes   Drug use: No   Sexual activity: Not on file  Other Topics Concern   Not on file  Social History Narrative   Lives with wife in own home   Right handed   Drinks 2-3 cps caffeine daily   Social Determinants of Health   Financial Resource Strain: Not on file  Food Insecurity: Not on file  Transportation Needs: Not on file  Physical Activity: Not on file  Stress: Not on file  Social Connections: Not on file     Family History: The patient's family history includes Healthy in his  brother, mother, and sister; Heart failure in his father; Rheumatic fever in his father.  ROS:   Please see the history of present illness.    No real complaints.  Easy to fall asleep.  States he has been this way his whole life.  Dr. Laurann Montana did the sleep study.  He is not sure if he has sleep apnea not according to the patient.  All other systems reviewed and are negative.  EKGs/Labs/Other Studies Reviewed:    The following studies were reviewed today: No new or recent imaging  EKG:  EKG sinus bradycardia at 57 bpm, right bundle, leftward axis, nonspecific T wave flattening.  Isolated PVC.  Recent Labs: 07/19/2020: BUN 19; Creatinine, Ser 1.07; Hemoglobin 12.3; Platelets 358; Potassium 5.0; Sodium 136  Recent Lipid Panel    Component Value  Date/Time   CHOL 157 07/20/2020 0402   CHOL 178 03/08/2018 0815   TRIG 66 07/20/2020 0402   HDL 58 07/20/2020 0402   HDL 66 03/08/2018 0815   CHOLHDL 2.7 07/20/2020 0402   VLDL 13 07/20/2020 0402   LDLCALC 86 07/20/2020 0402   LDLCALC 102 (H) 03/08/2018 0815    Physical Exam:    VS:  BP (!) 141/62    Pulse (!) 57    Ht 5\' 9"  (1.753 m)    Wt 144 lb 6.4 oz (65.5 kg)    SpO2 100%    BMI 21.32 kg/m     Wt Readings from Last 3 Encounters:  07/03/21 144 lb 6.4 oz (65.5 kg)  04/08/21 140 lb (63.5 kg)  03/06/21 137 lb 12.8 oz (62.5 kg)     GEN: Slender. No acute distress HEENT: Normal NECK: No JVD. LYMPHATICS: No lymphadenopathy CARDIAC: No murmur. RRR S4 gallop but no edema. VASCULAR:  Normal Pulses. No bruits. RESPIRATORY:  Clear to auscultation without rales, wheezing or rhonchi  ABDOMEN: Soft, non-tender, non-distended, No pulsatile mass, MUSCULOSKELETAL: No deformity  SKIN: Warm and dry NEUROLOGIC:  Alert and oriented x 3 PSYCHIATRIC:  Normal affect   ASSESSMENT:    1. Coronary artery disease involving native coronary artery of native heart without angina pectoris   2. Bilateral carotid artery stenosis   3. Sinoatrial node dysfunction (HCC)   4. Essential hypertension   5. Hyperlipidemia LDL goal <70   6. Right bundle branch block    PLAN:    In order of problems listed above:  Secondary prevention reviewed. Carotid's are being followed by Dr. [name]. No symptoms to suggest syncope or near syncope.  EKG is unchanged with mild sinus bradycardia. Blood pressure control is excellent for age.  Target 140/80. Continue high intensity atorvastatin and ezetimibe.  Ezetimibe was added after LDL was noted to be 100 to following his stroke Unchanged.   Overall education and awareness concerning primary/secondary risk prevention was discussed in detail: LDL less than 70, hemoglobin A1c less than 7, blood pressure target less than 130/80 mmHg, >150 minutes of moderate aerobic  activity per week, avoidance of smoking, weight control (via diet and exercise), and continued surveillance/management of/for obstructive sleep apnea.    Medication Adjustments/Labs and Tests Ordered: Current medicines are reviewed at length with the patient today.  Concerns regarding medicines are outlined above.  Orders Placed This Encounter  Procedures   EKG 12-Lead   No orders of the defined types were placed in this encounter.   Patient Instructions  Medication Instructions:  Your physician recommends that you continue on your current medications as directed. Please refer to the Current  Medication list given to you today.  *If you need a refill on your cardiac medications before your next appointment, please call your pharmacy*   Lab Work: None If you have labs (blood work) drawn today and your tests are completely normal, you will receive your results only by: Bertie (if you have MyChart) OR A paper copy in the mail If you have any lab test that is abnormal or we need to change your treatment, we will call you to review the results.   Testing/Procedures: None   Follow-Up: At [facility], you and your health needs are our priority.  As part of our continuing mission to provide you with exceptional heart care, we have created designated Provider Care Teams.  These Care Teams include your primary Cardiologist (physician) and Advanced Practice Providers (APPs -  Physician Assistants and Nurse Practitioners) who all work together to provide you with the care you need, when you need it.  We recommend signing up for the patient portal called "MyChart".  Sign up information is provided on this After Visit Summary.  MyChart is used to connect with patients for Virtual Visits (Telemedicine).  Patients are able to view lab/test results, encounter notes, upcoming appointments, etc.  Non-urgent messages can be sent to your provider as well.   To learn more about what you can  do with MyChart, go to [email or web address].    Your next appointment:   1 year(s)  The format for your next appointment:   In Person  Provider:   Sinclair Grooms, MD     Other Instructions     Signed, Sinclair Grooms, MD  07/03/2021 4:00 PM    [place]

## 2021-07-03 ENCOUNTER — Other Ambulatory Visit: Payer: Self-pay

## 2021-07-03 ENCOUNTER — Encounter: Payer: Self-pay | Admitting: Interventional Cardiology

## 2021-07-03 ENCOUNTER — Ambulatory Visit (INDEPENDENT_AMBULATORY_CARE_PROVIDER_SITE_OTHER): Payer: Medicare Other | Admitting: Interventional Cardiology

## 2021-07-03 VITALS — BP 141/62 | HR 57 | Ht 69.0 in | Wt 144.4 lb

## 2021-07-03 DIAGNOSIS — I10 Essential (primary) hypertension: Secondary | ICD-10-CM | POA: Diagnosis not present

## 2021-07-03 DIAGNOSIS — I495 Sick sinus syndrome: Secondary | ICD-10-CM | POA: Diagnosis not present

## 2021-07-03 DIAGNOSIS — I6523 Occlusion and stenosis of bilateral carotid arteries: Secondary | ICD-10-CM | POA: Diagnosis not present

## 2021-07-03 DIAGNOSIS — E785 Hyperlipidemia, unspecified: Secondary | ICD-10-CM

## 2021-07-03 DIAGNOSIS — I2510 Atherosclerotic heart disease of native coronary artery without angina pectoris: Secondary | ICD-10-CM | POA: Diagnosis not present

## 2021-07-03 DIAGNOSIS — I4510 Unspecified right bundle-branch block: Secondary | ICD-10-CM | POA: Diagnosis not present

## 2021-07-03 NOTE — Patient Instructions (Signed)

## 2021-08-31 ENCOUNTER — Other Ambulatory Visit: Payer: Self-pay

## 2021-08-31 MED ORDER — AMLODIPINE BESYLATE 5 MG PO TABS
5.0000 mg | ORAL_TABLET | Freq: Every day | ORAL | 3 refills | Status: DC
Start: 2021-08-31 — End: 2022-09-03

## 2021-09-11 DIAGNOSIS — H18513 Endothelial corneal dystrophy, bilateral: Secondary | ICD-10-CM | POA: Diagnosis not present

## 2021-10-12 ENCOUNTER — Other Ambulatory Visit: Payer: Self-pay | Admitting: Adult Health

## 2021-10-12 ENCOUNTER — Telehealth: Payer: Self-pay | Admitting: Adult Health

## 2021-10-12 NOTE — Telephone Encounter (Signed)
Did reach out to Dr. Tamala Julian (cardiology) who agreed to monitor carotid stenosis after prior visit back in November. Looks like an order was placed to complete carotid ultrasound around 12/2021.

## 2021-10-12 NOTE — Telephone Encounter (Signed)
Pt is asking for a call to discuss scheduling the testing for Carotid artery stenosis

## 2021-10-13 ENCOUNTER — Encounter: Payer: Self-pay | Admitting: *Deleted

## 2021-10-13 NOTE — Telephone Encounter (Signed)
Have called home # x 2. It never rings. There is no other # listed. Sent my chart advising patient that Dr Daneen Schick has ordered carotid US to be done in Aug, gave him MD # to call.

## 2021-10-14 NOTE — Telephone Encounter (Signed)
Pt called again. He was informed that RN was trying to reach him several times and phone did not ring. Pt would like RN to try the number again [phone]. Pt states he will be there till noon today. Pt would like the RN to LVM if VM comes up.

## 2021-10-14 NOTE — Telephone Encounter (Signed)
Called # which is same # I have called before. It does not ring, no VM comes on. If patient calls back phone staff please  inform him to call Dr Grayland Ormond office about the carotid US # [phone].

## 2021-10-19 NOTE — Telephone Encounter (Signed)
Received call from patient who asked about his follow up carotid US. I explained that Dr Tamala Julian has agreed to follow up with repeat US, has placed order with expected date for [place] in Aug. Patient stated he will call Dr Thompson Caul office to check on that  and schedule it. Patient verbalized understanding, appreciation.

## 2021-11-23 ENCOUNTER — Other Ambulatory Visit: Payer: Self-pay | Admitting: Interventional Cardiology

## 2021-12-17 ENCOUNTER — Ambulatory Visit (HOSPITAL_COMMUNITY)
Admission: RE | Admit: 2021-12-17 | Discharge: 2021-12-17 | Disposition: A | Discharge status: Home / Self Care | Payer: Medicare Other | Source: Ambulatory Visit | Attending: Cardiology | Admitting: Cardiology

## 2021-12-17 DIAGNOSIS — I6523 Occlusion and stenosis of bilateral carotid arteries: Secondary | ICD-10-CM | POA: Diagnosis not present

## 2022-02-08 ENCOUNTER — Other Ambulatory Visit (HOSPITAL_BASED_OUTPATIENT_CLINIC_OR_DEPARTMENT_OTHER): Payer: Self-pay

## 2022-02-08 DIAGNOSIS — Z23 Encounter for immunization: Secondary | ICD-10-CM | POA: Diagnosis not present

## 2022-02-08 MED ORDER — FLUAD QUADRIVALENT 0.5 ML IM PRSY
PREFILLED_SYRINGE | INTRAMUSCULAR | 0 refills | Status: DC
  Filled 2022-02-08: qty 0.5, 1d supply, fill #0

## 2022-02-10 ENCOUNTER — Other Ambulatory Visit (HOSPITAL_BASED_OUTPATIENT_CLINIC_OR_DEPARTMENT_OTHER): Payer: Self-pay

## 2022-02-22 ENCOUNTER — Telehealth: Payer: Self-pay | Admitting: Interventional Cardiology

## 2022-02-22 NOTE — Telephone Encounter (Signed)
Returned call to patient.  He states he was trying to make his follow-up appointment due in February 2024 and he learned that Dr. Tamala Julian will be retiring in January 2024.   Patient would like to know if Dr. Tamala Julian has a recommendation for who he should follow-up with.  Patient states he will very much miss Dr. Tamala Julian and has enjoyed being in his care for the last 20-25 years.  Will forward to Dr. Tamala Julian to review and advise.

## 2022-02-22 NOTE — Telephone Encounter (Signed)
Patient requested to speak with Dr. Tamala Julian.

## 2022-02-23 NOTE — Telephone Encounter (Signed)
Notified patient of Dr. Thompson Caul reply: Younger male partner to follow his CAD and risk factors.  No specific name.   Patient verbalized understanding and expressed appreciation for follow-up.

## 2022-03-09 ENCOUNTER — Ambulatory Visit (INDEPENDENT_AMBULATORY_CARE_PROVIDER_SITE_OTHER): Payer: Medicare Other

## 2022-03-09 ENCOUNTER — Encounter: Payer: Self-pay | Admitting: Internal Medicine

## 2022-03-09 ENCOUNTER — Ambulatory Visit (INDEPENDENT_AMBULATORY_CARE_PROVIDER_SITE_OTHER): Payer: Medicare Other | Admitting: Internal Medicine

## 2022-03-09 DIAGNOSIS — R918 Other nonspecific abnormal finding of lung field: Secondary | ICD-10-CM | POA: Diagnosis not present

## 2022-03-09 DIAGNOSIS — R9389 Abnormal findings on diagnostic imaging of other specified body structures: Secondary | ICD-10-CM | POA: Diagnosis not present

## 2022-03-09 DIAGNOSIS — I6523 Occlusion and stenosis of bilateral carotid arteries: Secondary | ICD-10-CM

## 2022-03-09 NOTE — Progress Notes (Signed)
Subjective:   Patient ID: Richard [name], male    DOB: [birth year]    MRN: [id number]    Brief patient profile: 57 yowm , who was referred by Dr. Lavone Orn  in consultation by DeDios 04/08/16  regarding abnormal chest ct scan c/w MAI and unexplained wt loss  With a 15 PY smoking history, quit in 1965, not been diagnosed with copd or asthma, had LUL lobectomy for Lung CA in 07/2008 by Arlyce Dice s adjuvant rx and released to f/u by Mohammed p 5 y neg surveillance  rec  q 6 m f/u      History of Present Illness  10/26/2016  f/u ov/Rosene Pilling re: transition of care/ pt new to me  Chief Complaint  Patient presents with   Follow-up    Breathing good.   no cough/ no sweats/ no fever Appetite poor since original surgery/ baseline wt 160-170  Not limited by breathing from desired activities   rec F/u q 3 m    03/06/2021  f/u ov/Lavenia Stumpo re: ? MAI    rx observation only  Chief Complaint  Patient presents with   Follow-up    Breathing is doing well and no new co's today.    Dyspnea:  mowing grass up to an hour self propelled  Cough: none  Sleeping: fine / flat bed  SABA use: none  02: none  Covid status:   vax x 4  Rec No change rx  F/u yearly    03/09/2022  f/u ov/Tannen Vandezande re: ? 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   Sleeping  without nocturnal  or early am exacerbation  of respiratory  c/o's or need for noct saba. Also denies any obvious fluctuation of symptoms with weather or environmental changes or  other aggravating or alleviating factors except as outlined above   No unusual exposure hx or h/o childhood pna/ asthma or knowledge of premature birth.  Current Allergies, Complete Past Medical History, Past Surgical History, Family History, and Social History were reviewed in [employer] record.  ROS  The following are not active complaints unless bolded Hoarseness, sore throat, dysphagia, dental problems, itching, sneezing,  nasal congestion or discharge of excess mucus or purulent secretions, ear ache,   fever, chills, sweats, unintended wt loss or wt gain, classically pleuritic or exertional cp,  orthopnea pnd or arm/hand swelling  or leg swelling, presyncope, palpitations, abdominal pain, anorexia, nausea, vomiting, diarrhea  or change in bowel habits or change in bladder habits, change in stools or change in urine, dysuria, hematuria,  rash, arthralgias, visual complaints, headache, numbness, weakness or ataxia or problems with walking or coordination,  change in mood or  memory.        Current Meds  Medication Sig   amLODipine (NORVASC) 5 MG tablet Take 1 tablet (5 mg total) by mouth daily.   aspirin EC 81 MG  tablet Take 1 tablet (81 mg total) by mouth daily. Swallow whole.   atorvastatin (LIPITOR) 80 MG tablet Take 1 tablet (80 mg total) by mouth daily.   dorzolamide-timolol (COSOPT) 22.3-6.8 MG/ML ophthalmic solution Place 1 drop into both eyes 2 (two) times daily.    ezetimibe (ZETIA) 10 MG tablet Take 1 tablet by mouth daily.   fluorometholone (FML) 0.1 % ophthalmic suspension Place 1 drop into both eyes daily.   latanoprost (XALATAN) 0.005 % ophthalmic solution Place 1 drop into both eyes at bedtime.    Netarsudil Dimesylate 0.02 % SOLN Place 1 drop into the left eye daily.            Objective:   Physical Exam   03/09/2022      144  03/06/2021      137   03/04/2020       138  09/21/2019         142  02/27/2019       140  02/22/2018      135   08/23/2017        136  02/10/2017        137  10/26/2016        139   04/08/16 141 lb (64 kg)  12/30/15 139 lb 3.2 oz (63.1 kg)  09/24/14 148 lb 12.8 oz (67.5 kg)   Vital signs reviewed  03/09/2022  - Note at rest 02 sats  96% on RA   General appearance:    amb wm nad    HEENT : Oropharynx  clear      Nasal turbinates nl    NECK :  without  apparent JVD/ palpable Nodes/TM    LUNGS: no acc muscle use,  Nl contour chest which is clear to A and P bilaterally without cough on insp or exp maneuvers   CV:  RRR  no s3 or murmur or increase in P2, and no edema   ABD:  soft and nontender with nl inspiratory excursion in the supine position. No bruits or organomegaly appreciated   MS:  Nl gait/ ext warm without deformities Or obvious joint restrictions  calf tenderness, cyanosis or clubbing    SKIN: warm and dry without lesions    NEURO:  alert, approp, nl sensorium with  no motor or cerebellar deficits apparent.       CXR PA and Lateral:   03/09/2022 :    I personally reviewed images and impression is as follows:     No acute change      Assessment & Plan:

## 2022-03-09 NOTE — Patient Instructions (Signed)
Please remember to go to the x-ray department   for your tests - we will call you with the results when they are available.     Please schedule a follow up visit in  12 months but call sooner if needed.    

## 2022-03-09 NOTE — Assessment & Plan Note (Signed)
CT chest 6/13 /18 1. 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 Overall, there has been mild improvement in the right upper and middle lobe involvement, although there are 2 enlarging focal components in the right upper lobe as well as new clustered nodularity in the left lower lobe. Given the patient's history, continued follow-up should be considered. - ESR 10/26/2016  = 86 with Eos 0.3  Vs   On 02/10/2017  ESR 75 with Eos 0.4  - ESR 02/22/2018 = 65  - 09/21/2019 cxr s changes from priors/ wt loss improved  - 03/06/2021 cxr no change, wt stabilized > rec f/u yearly   No radiographic or clinical evidence of progression so f/u can yearly          Each maintenance medication was reviewed in detail including emphasizing most importantly the difference between maintenance and prns and under what circumstances the prns are to be triggered using an action plan format where appropriate.  Total time for H and P, chart review, counseling,  and generating customized AVS unique to this office visit / same day charting = 25 min

## 2022-03-10 DIAGNOSIS — Z23 Encounter for immunization: Secondary | ICD-10-CM | POA: Diagnosis not present

## 2022-03-15 NOTE — Progress Notes (Signed)
Called and left detailed msg on machine ok per DPR.

## 2022-03-16 ENCOUNTER — Telehealth: Payer: Self-pay | Admitting: Internal Medicine

## 2022-03-16 NOTE — Telephone Encounter (Signed)
Call made to patient, confirmed DOB, made aware of recommendations:    Voiced understanding. Nothing further needed at this time.

## 2022-03-23 DIAGNOSIS — H02883 Meibomian gland dysfunction of right eye, unspecified eyelid: Secondary | ICD-10-CM | POA: Diagnosis not present

## 2022-03-23 DIAGNOSIS — H401122 Primary open-angle glaucoma, left eye, moderate stage: Secondary | ICD-10-CM | POA: Diagnosis not present

## 2022-03-23 DIAGNOSIS — H16143 Punctate keratitis, bilateral: Secondary | ICD-10-CM | POA: Diagnosis not present

## 2022-03-23 DIAGNOSIS — H1820 Unspecified corneal edema: Secondary | ICD-10-CM | POA: Diagnosis not present

## 2022-03-23 DIAGNOSIS — Z947 Corneal transplant status: Secondary | ICD-10-CM | POA: Diagnosis not present

## 2022-03-23 DIAGNOSIS — H02886 Meibomian gland dysfunction of left eye, unspecified eyelid: Secondary | ICD-10-CM | POA: Diagnosis not present

## 2022-03-23 DIAGNOSIS — Z961 Presence of intraocular lens: Secondary | ICD-10-CM | POA: Diagnosis not present

## 2022-03-23 DIAGNOSIS — H4051X1 Glaucoma secondary to other eye disorders, right eye, mild stage: Secondary | ICD-10-CM | POA: Diagnosis not present

## 2022-03-23 DIAGNOSIS — H5704 Mydriasis: Secondary | ICD-10-CM | POA: Diagnosis not present

## 2022-03-23 DIAGNOSIS — H18513 Endothelial corneal dystrophy, bilateral: Secondary | ICD-10-CM | POA: Diagnosis not present

## 2022-04-05 ENCOUNTER — Other Ambulatory Visit (HOSPITAL_COMMUNITY): Payer: Self-pay | Admitting: Interventional Cardiology

## 2022-04-05 DIAGNOSIS — I6521 Occlusion and stenosis of right carotid artery: Secondary | ICD-10-CM

## 2022-05-21 DIAGNOSIS — R7301 Impaired fasting glucose: Secondary | ICD-10-CM | POA: Diagnosis not present

## 2022-05-21 DIAGNOSIS — E7800 Pure hypercholesterolemia, unspecified: Secondary | ICD-10-CM | POA: Diagnosis not present

## 2022-05-21 DIAGNOSIS — N529 Male erectile dysfunction, unspecified: Secondary | ICD-10-CM | POA: Diagnosis not present

## 2022-05-21 DIAGNOSIS — I6522 Occlusion and stenosis of left carotid artery: Secondary | ICD-10-CM | POA: Diagnosis not present

## 2022-05-21 DIAGNOSIS — Z8673 Personal history of transient ischemic attack (TIA), and cerebral infarction without residual deficits: Secondary | ICD-10-CM | POA: Diagnosis not present

## 2022-05-21 DIAGNOSIS — I10 Essential (primary) hypertension: Secondary | ICD-10-CM | POA: Diagnosis not present

## 2022-05-21 DIAGNOSIS — Z0000 Encounter for general adult medical examination without abnormal findings: Secondary | ICD-10-CM | POA: Diagnosis not present

## 2022-05-21 DIAGNOSIS — I2510 Atherosclerotic heart disease of native coronary artery without angina pectoris: Secondary | ICD-10-CM | POA: Diagnosis not present

## 2022-05-21 DIAGNOSIS — Z1331 Encounter for screening for depression: Secondary | ICD-10-CM | POA: Diagnosis not present

## 2022-05-28 DIAGNOSIS — R051 Acute cough: Secondary | ICD-10-CM | POA: Diagnosis not present

## 2022-05-28 DIAGNOSIS — Z03818 Encounter for observation for suspected exposure to other biological agents ruled out: Secondary | ICD-10-CM | POA: Diagnosis not present

## 2022-05-28 DIAGNOSIS — R0602 Shortness of breath: Secondary | ICD-10-CM | POA: Diagnosis not present

## 2022-06-04 DIAGNOSIS — J22 Unspecified acute lower respiratory infection: Secondary | ICD-10-CM | POA: Diagnosis not present

## 2022-07-03 NOTE — Progress Notes (Unsigned)
Office Visit    Patient Name: Richard [name] Date of Encounter: 07/06/2022  PCP:  Lavone Orn, [facility]  Cardiologist:  Lauree Chandler, MD  Advanced Practice Provider:  No care team member to display Electrophysiologist:  None   HPI    Richard [name] is a 88 y.o. male with a past medical history of coronary artery disease with known chronic total occlusion of the RCA collateralized from the left and moderate diagonal disease in 2003, sinue node dysfunction, RBBB, hypertension, and fatigue presents today for follow-up.  He had a TIA March 2022 and was having trouble speaking at that time. He had a workup for TIA/stroke reveals 60% carotid artery stenosis.   He was seen last year and didn't have any cardiac symptoms at that time. No palpitations, angina, syncope, orthopnea, or PND.  Today, he he had a hard year and had to say goodbye to his home of 45 years. They moved over to [place] and he is settling in there. He plans to enroll in some of the exercise classes that they offer. He enjoys the [employer] students that he interacts with during meal time. He sold his house and it went smoothly. He asked me about grapefruit juice and his cardiac medications. He was told by Dr. Tamala Julian that if he takes his medications at night, he can have some the next day. There is an interaction with his Norvasc so advised to limit consumption. He has been retired for 24 years now and has not had any cardiac issues in the past year.   Reports no shortness of breath nor dyspnea on exertion. Reports no chest pain, pressure, or tightness. No edema, orthopnea, PND. Reports no palpitations. '   Past Medical History    Past Medical History:  Diagnosis Date   Coronary artery disease    Coronary artery stenosis    Hypercholesteremia    Hypertension    lung ca dx'd 07/2008   Stroke ([place]) 07/19/2020   Past Surgical History:  Procedure Laterality Date   bilateral  inguinal hernia repair     CORNEAL TRANSPLANT     Left video-assisted thoracic surgery, mini-  10/24/2008    Allergies  Allergies  Allergen Reactions   Aspirin Hives    325 mg strength only   EKGs/Labs/Other Studies Reviewed:   The following studies were reviewed today:  Echo 07/20/20 IMPRESSIONS     1. Left ventricular ejection fraction, by estimation, is 60 to 65%. The  left ventricle has normal function. The left ventricle has no regional  wall motion abnormalities. There is mild concentric left ventricular  hypertrophy. Left ventricular diastolic  parameters are consistent with Grade II diastolic dysfunction  (pseudonormalization). Elevated left atrial pressure. The average left  ventricular global longitudinal strain is -17.8 %.   2. Right ventricular systolic function is normal. The right ventricular  size is normal. There is normal pulmonary artery systolic pressure. The  estimated right ventricular systolic pressure is 0000000 mmHg.   3. Left atrial size was mildly dilated.   4. The mitral valve is grossly normal. Trivial mitral valve  regurgitation. No evidence of mitral stenosis.   5. The aortic valve is tricuspid. There is mild calcification of the  aortic valve. There is mild thickening of the aortic valve. Aortic valve  regurgitation is not visualized. Mild to moderate aortic valve  sclerosis/calcification is present, without any  evidence of aortic stenosis.   6. The inferior vena cava  is normal in size with greater than 50%  respiratory variability, suggesting right atrial pressure of 3 mmHg.   Conclusion(s)/Recommendation(s): No intracardiac source of embolism  detected on this transthoracic study. A transesophageal echocardiogram is  recommended to exclude cardiac source of embolism if clinically indicated.   FINDINGS   Left Ventricle: Left ventricular ejection fraction, by estimation, is 60  to 65%. The left ventricle has normal function. The left  ventricle has no  regional wall motion abnormalities. The average left ventricular global  longitudinal strain is -17.8 %.  The left ventricular internal cavity size was normal in size. There is  mild concentric left ventricular hypertrophy. Abnormal (paradoxical)  septal motion, consistent with left bundle branch block. Left ventricular  diastolic parameters are consistent with   Grade II diastolic dysfunction (pseudonormalization). Elevated left  atrial pressure.   Right Ventricle: The right ventricular size is normal. No increase in  right ventricular wall thickness. Right ventricular systolic function is  normal. There is normal pulmonary artery systolic pressure. The tricuspid  regurgitant velocity is 2.73 m/s, and   with an assumed right atrial pressure of 3 mmHg, the estimated right  ventricular systolic pressure is 0000000 mmHg.   Left Atrium: Left atrial size was mildly dilated.   Right Atrium: Right atrial size was normal in size.   Pericardium: Trivial pericardial effusion is present. Presence of  pericardial fat pad.   Mitral Valve: The mitral valve is grossly normal. Mild to moderate mitral  annular calcification. Trivial mitral valve regurgitation. No evidence of  mitral valve stenosis. MV peak gradient, 3.1 mmHg. The mean mitral valve  gradient is 1.0 mmHg.   Tricuspid Valve: The tricuspid valve is grossly normal. Tricuspid valve  regurgitation is mild . No evidence of tricuspid stenosis.   Aortic Valve: The aortic valve is tricuspid. There is mild calcification  of the aortic valve. There is mild thickening of the aortic valve. Aortic  valve regurgitation is not visualized. Mild to moderate aortic valve  sclerosis/calcification is present,  without any evidence of aortic stenosis. Aortic valve mean gradient  measures 3.0 mmHg. Aortic valve peak gradient measures 5.7 mmHg. Aortic  valve area, by VTI measures 2.53 cm.   Pulmonic Valve: The pulmonic valve was  grossly normal. Pulmonic valve  regurgitation is not visualized. No evidence of pulmonic stenosis.   Aorta: The aortic root and ascending aorta are structurally normal, with  no evidence of dilitation.   Venous: The right upper pulmonary vein is normal. The inferior vena cava  is normal in size with greater than 50% respiratory variability,  suggesting right atrial pressure of 3 mmHg.   IAS/Shunts: The atrial septum is grossly normal.       EKG:  EKG is  ordered today.  The ekg ordered today demonstrates NSR, 53 bpm, old RBBB  Recent Labs: No results found for requested labs within last 365 days.  Recent Lipid Panel    Component Value Date/Time   CHOL 157 07/20/2020 0402   CHOL 178 03/08/2018 0815   TRIG 66 07/20/2020 0402   HDL 58 07/20/2020 0402   HDL 66 03/08/2018 0815   CHOLHDL 2.7 07/20/2020 0402   VLDL 13 07/20/2020 0402   LDLCALC 86 07/20/2020 0402   LDLCALC 102 (H) 03/08/2018 0815    Home Medications   Current Meds  Medication Sig   amLODipine (NORVASC) 5 MG tablet Take 1 tablet (5 mg total) by mouth daily.   aspirin EC 81 MG tablet Take 1 tablet (  81 mg total) by mouth daily. Swallow whole.   atorvastatin (LIPITOR) 80 MG tablet Take 1 tablet (80 mg total) by mouth daily.   dorzolamide-timolol (COSOPT) 22.3-6.8 MG/ML ophthalmic solution Place 1 drop into both eyes 2 (two) times daily.    fluorometholone (FML) 0.1 % ophthalmic suspension Place 1 drop into both eyes daily.   latanoprost (XALATAN) 0.005 % ophthalmic solution Place 1 drop into both eyes at bedtime.    Netarsudil Dimesylate 0.02 % SOLN Place 1 drop into the left eye daily.   [DISCONTINUED] ezetimibe (ZETIA) 10 MG tablet Take 1 tablet by mouth daily.     Review of Systems      All other systems reviewed and are otherwise negative except as noted above.  Physical Exam    VS:  BP 106/76   Pulse (!) 53   Ht '5\' 9"'$  (1.753 m)   Wt 141 lb 12.8 oz (64.3 kg)   SpO2 99%   BMI 20.94 kg/m  , BMI Body  mass index is 20.94 kg/m.  Wt Readings from Last 3 Encounters:  07/06/22 141 lb 12.8 oz (64.3 kg)  03/09/22 144 lb 12.8 oz (65.7 kg)  07/03/21 144 lb 6.4 oz (65.5 kg)     GEN: Well nourished, well developed, in no acute distress. HEENT: normal. Neck: Supple, no JVD, carotid bruits, or masses. Cardiac: RRR, no murmurs, rubs, or gallops. No clubbing, cyanosis, edema.  Radials/PT 2+ and equal bilaterally.  Respiratory:  Respirations regular and unlabored, clear to auscultation bilaterally. GI: Soft, nontender, nondistended. MS: No deformity or atrophy. Skin: Warm and dry, no rash. Neuro:  Strength and sensation are intact. Psych: Normal affect.  Assessment & Plan    Coronary artery disease -no issues with chest pain or SOB -recommended increasing physical activity  -continue current medications amlodipineNorvasc '5mg'$  daily, asa 81 mg, lipitor '80mg'$  daily, zetia '10mg'$  daily  Bilateral carotid artery stenosis -moderate left carotid disease and mild right disease -repeat carotid US in Aug  Sinoatrial node dysfunction/RBBB -stable on EKG today -avoiding BB  Hypertension -well controlled today -continue current medications  Hyperlipidemia -continue lipitor and zetia -last LDL 93, triglycerides 54 -due to lipid panel 05/2023     Disposition: Follow up 6 months with Lauree Chandler, MD or APP.  Signed, Elgie Collard, PA-C 07/06/2022, 11:23 AM [place]

## 2022-07-06 ENCOUNTER — Ambulatory Visit: Payer: Medicare Other | Attending: Physician Assistant | Admitting: Physician Assistant

## 2022-07-06 ENCOUNTER — Encounter: Payer: Self-pay | Admitting: Physician Assistant

## 2022-07-06 VITALS — BP 106/76 | HR 53 | Ht 69.0 in | Wt 141.8 lb

## 2022-07-06 DIAGNOSIS — I779 Disorder of arteries and arterioles, unspecified: Secondary | ICD-10-CM

## 2022-07-06 DIAGNOSIS — I6523 Occlusion and stenosis of bilateral carotid arteries: Secondary | ICD-10-CM

## 2022-07-06 DIAGNOSIS — I4510 Unspecified right bundle-branch block: Secondary | ICD-10-CM | POA: Diagnosis not present

## 2022-07-06 DIAGNOSIS — I495 Sick sinus syndrome: Secondary | ICD-10-CM | POA: Diagnosis not present

## 2022-07-06 DIAGNOSIS — E785 Hyperlipidemia, unspecified: Secondary | ICD-10-CM | POA: Diagnosis not present

## 2022-07-06 DIAGNOSIS — I10 Essential (primary) hypertension: Secondary | ICD-10-CM | POA: Diagnosis not present

## 2022-07-06 DIAGNOSIS — I2510 Atherosclerotic heart disease of native coronary artery without angina pectoris: Secondary | ICD-10-CM

## 2022-07-06 MED ORDER — EZETIMIBE 10 MG PO TABS: 10.0000 mg | ORAL_TABLET | Freq: Every day | ORAL | 3 refills | Status: DC

## 2022-07-06 NOTE — Patient Instructions (Signed)
Medication Instructions:  Your physician recommends that you continue on your current medications as directed. Please refer to the Current Medication list given to you today.  *If you need a refill on your cardiac medications before your next appointment, please call your pharmacy*   Lab Work: None If you have labs (blood work) drawn today and your tests are completely normal, you will receive your results only by: [place] (if you have MyChart) OR A paper copy in the mail If you have any lab test that is abnormal or we need to change your treatment, we will call you to review the results.   Testing/Procedures: Your physician has requested that you have a carotid duplex in August. This test is an ultrasound of the carotid arteries in your neck. It looks at blood flow through these arteries that supply the brain with blood. Allow one hour for this exam. There are no restrictions or special instructions.    Follow-Up: At [facility], you and your health needs are our priority.  As part of our continuing mission to provide you with exceptional heart care, we have created designated Provider Care Teams.  These Care Teams include your primary Cardiologist (physician) and Advanced Practice Providers (APPs -  Physician Assistants and Nurse Practitioners) who all work together to provide you with the care you need, when you need it.   Your next appointment:   6 month(s)  Provider:   Dr Angelena Form

## 2022-07-14 DIAGNOSIS — L821 Other seborrheic keratosis: Secondary | ICD-10-CM | POA: Diagnosis not present

## 2022-07-14 DIAGNOSIS — L570 Actinic keratosis: Secondary | ICD-10-CM | POA: Diagnosis not present

## 2022-07-14 DIAGNOSIS — Z85828 Personal history of other malignant neoplasm of skin: Secondary | ICD-10-CM | POA: Diagnosis not present

## 2022-07-14 DIAGNOSIS — D1801 Hemangioma of skin and subcutaneous tissue: Secondary | ICD-10-CM | POA: Diagnosis not present

## 2022-07-14 DIAGNOSIS — L578 Other skin changes due to chronic exposure to nonionizing radiation: Secondary | ICD-10-CM | POA: Diagnosis not present

## 2022-08-19 ENCOUNTER — Other Ambulatory Visit (HOSPITAL_BASED_OUTPATIENT_CLINIC_OR_DEPARTMENT_OTHER): Payer: Self-pay

## 2022-09-02 DIAGNOSIS — Z23 Encounter for immunization: Secondary | ICD-10-CM | POA: Diagnosis not present

## 2022-09-03 ENCOUNTER — Other Ambulatory Visit: Payer: Self-pay

## 2022-09-03 MED ORDER — AMLODIPINE BESYLATE 5 MG PO TABS: 5.0000 mg | ORAL_TABLET | Freq: Every day | ORAL | 3 refills | Status: DC

## 2022-09-17 DIAGNOSIS — H18513 Endothelial corneal dystrophy, bilateral: Secondary | ICD-10-CM | POA: Diagnosis not present

## 2022-09-17 DIAGNOSIS — Z947 Corneal transplant status: Secondary | ICD-10-CM | POA: Diagnosis not present

## 2022-09-24 DIAGNOSIS — H401122 Primary open-angle glaucoma, left eye, moderate stage: Secondary | ICD-10-CM | POA: Diagnosis not present

## 2022-09-24 DIAGNOSIS — H18513 Endothelial corneal dystrophy, bilateral: Secondary | ICD-10-CM | POA: Diagnosis not present

## 2022-09-24 DIAGNOSIS — H4051X1 Glaucoma secondary to other eye disorders, right eye, mild stage: Secondary | ICD-10-CM | POA: Diagnosis not present

## 2022-09-24 DIAGNOSIS — H353131 Nonexudative age-related macular degeneration, bilateral, early dry stage: Secondary | ICD-10-CM | POA: Diagnosis not present

## 2022-09-24 DIAGNOSIS — Z947 Corneal transplant status: Secondary | ICD-10-CM | POA: Diagnosis not present

## 2022-10-20 ENCOUNTER — Encounter: Payer: Self-pay | Admitting: Cardiovascular Disease

## 2022-11-25 ENCOUNTER — Other Ambulatory Visit: Payer: Self-pay

## 2022-11-25 MED ORDER — ATORVASTATIN CALCIUM 80 MG PO TABS: 80.0000 mg | ORAL_TABLET | Freq: Every day | ORAL | 2 refills | Status: DC

## 2022-12-22 ENCOUNTER — Ambulatory Visit (HOSPITAL_COMMUNITY)
Admission: RE | Admit: 2022-12-22 | Discharge: 2022-12-22 | Disposition: A | Discharge status: Home / Self Care | Payer: Medicare Other | Source: Ambulatory Visit | Attending: Physician Assistant | Admitting: Physician Assistant

## 2022-12-22 DIAGNOSIS — Z8673 Personal history of transient ischemic attack (TIA), and cerebral infarction without residual deficits: Secondary | ICD-10-CM | POA: Diagnosis not present

## 2022-12-22 DIAGNOSIS — I779 Disorder of arteries and arterioles, unspecified: Secondary | ICD-10-CM | POA: Insufficient documentation

## 2022-12-23 ENCOUNTER — Telehealth: Payer: Self-pay | Admitting: Cardiovascular Disease

## 2022-12-23 NOTE — Telephone Encounter (Signed)
Patient is returning call for results. Please advise

## 2022-12-23 NOTE — Telephone Encounter (Signed)
Richard Hazel, MD 12/23/2022 11:55 AM EDT     Former pt of Dr. Katrinka Blazing. Moderate left carotid stenosis, stable from 2023 without change. Repeat in one year. Richard [name]   Loa Socks, LPN 8/41/3244  0:10 PM EDT     Left message for the pt to call back for results.   Sharlene Dory, PA-C 12/22/2022  1:32 PM EDT     Richard [name],   You have mild right carotid stenosis (1 to 39%) and you have moderate left carotid artery stenosis (60 to 79%).  I would like to repeat bilateral carotid ultrasound in a year to keep an eye on your stenosis.  In the meantime we will plan to keep your cholesterol as low as possible.  LDL needs to be below 70 (and really below 55).  If you have questions please let me know.   Sharlene Dory, PA-C   Relayed above results to patient and scheduled for needed appt with [place].

## 2023-01-07 ENCOUNTER — Ambulatory Visit: Payer: Medicare Other | Admitting: Cardiovascular Disease

## 2023-03-01 DIAGNOSIS — Z23 Encounter for immunization: Secondary | ICD-10-CM | POA: Diagnosis not present

## 2023-03-11 ENCOUNTER — Encounter: Payer: Self-pay | Admitting: Internal Medicine

## 2023-03-11 ENCOUNTER — Ambulatory Visit (INDEPENDENT_AMBULATORY_CARE_PROVIDER_SITE_OTHER): Payer: Medicare Other | Admitting: Internal Medicine

## 2023-03-11 ENCOUNTER — Ambulatory Visit: Payer: Medicare Other

## 2023-03-11 VITALS — BP 116/66 | HR 57 | Temp 98.6°F | Ht 69.0 in | Wt 141.0 lb

## 2023-03-11 DIAGNOSIS — I700 Atherosclerosis of aorta: Secondary | ICD-10-CM | POA: Diagnosis not present

## 2023-03-11 DIAGNOSIS — R9389 Abnormal findings on diagnostic imaging of other specified body structures: Secondary | ICD-10-CM

## 2023-03-11 DIAGNOSIS — J42 Unspecified chronic bronchitis: Secondary | ICD-10-CM | POA: Diagnosis not present

## 2023-03-11 NOTE — Progress Notes (Unsigned)
Subjective:   Patient ID: Richard [name], male    DOB: [birth year]    MRN: [id number]    Brief patient profile: 68 yowm , who was referred by Richard [name]  in consultation by Richard [name] 04/08/16  regarding abnormal chest ct scan c/w MAI and unexplained wt loss  With a 15 PY smoking history, quit in 1965, not been diagnosed with copd or asthma, had LUL lobectomy for Lung CA in 07/2008 by Richard [name] s adjuvant rx and released to f/u by Richard [name] 5 y neg surveillance  rec  q 6 m f/u      History of Present Illness  10/26/2016  f/u ov/Richard [name] re: transition of care/ pt new to me  Chief Complaint  Patient presents with   Follow-up    Breathing good.   no cough/ no sweats/ no fever Appetite poor since original surgery/ baseline wt 160-170  Not limited by breathing from desired activities   rec F/u q 3 m     03/09/2022  f/u ov/Richard [name] re: ? MAI    maint on observation   Chief Complaint  Patient presents with   Follow-up    Doing well   Dyspnea:  mowing grass until [name] 2023 now moved to [employer] / now doing some gym ex  - Not limited by breathing from desired activities   Cough: none  Sleeping: bed flat / one pillow SABA use: none  02: none  Covid status:   vax #5 planned  Rec Cxr > no change MAI pattern No change rx   03/11/2023  30m  f/u ov/Richard [name] re: ? MAI    maint on no resp meds  Chief Complaint  Patient presents with   Follow-up    Doing well   Dyspnea:  no longer doing gym ex but Not limited by breathing from desired activities   Cough: none  Sleeping: flat bed / one pillow resp cc s am flare  SABA use: none  02: none      No obvious day to day or daytime variability or assoc excess/ purulent sputum or mucus plugs or hemoptysis or cp or chest tightness, subjective wheeze or overt sinus or hb symptoms.    Also denies any obvious fluctuation of symptoms with weather or environmental changes or other aggravating or alleviating factors except as outlined above   No unusual  exposure hx or h/o childhood pna/ asthma or knowledge of premature birth.  Current Allergies, Complete Past Medical History, Past Surgical History, Family History, and Social History were reviewed in [employer] record.  ROS  The following are not active complaints unless bolded Hoarseness, sore throat, dysphagia, dental problems, itching, sneezing,  nasal congestion or discharge of excess mucus or purulent secretions, ear ache,   fever, chills, sweats, unintended wt loss or wt gain, classically pleuritic or exertional cp,  orthopnea pnd or arm/hand swelling  or leg swelling, presyncope, palpitations, abdominal pain, anorexia, nausea, vomiting, diarrhea  or change in bowel habits or change in bladder habits, change in stools or change in urine, dysuria, hematuria,  rash, arthralgias, visual complaints, headache, numbness, weakness or ataxia or problems with walking or coordination,  change in mood or  memory. Decreased appetite         Current Meds  Medication Sig   amLODipine (NORVASC) 5 MG tablet Take 1 tablet (5 mg total) by mouth daily.   aspirin EC 81 MG tablet Take 1 tablet (81 mg total) by mouth daily. Swallow whole.  atorvastatin (LIPITOR) 80 MG tablet Take 1 tablet (80 mg total) by mouth daily.   dorzolamide-timolol (COSOPT) 22.3-6.8 MG/ML ophthalmic solution Place 1 drop into both eyes 2 (two) times daily.    ezetimibe (ZETIA) 10 MG tablet Take 1 tablet (10 mg total) by mouth daily.   fluorometholone (FML) 0.1 % ophthalmic suspension Place 1 drop into both eyes daily.   latanoprost (XALATAN) 0.005 % ophthalmic solution Place 1 drop into both eyes at bedtime.    Netarsudil Dimesylate 0.02 % SOLN Place 1 drop into the left eye daily.                 Objective:   Physical Exam  Wts  03/11/2023        141  03/09/2022      144  03/06/2021      137   03/04/2020       138  09/21/2019         142  02/27/2019       140  02/22/2018      135  08/23/2017         136  02/10/2017        137  10/26/2016        139   04/08/16 141 lb (64 kg)  12/30/15 139 lb 3.2 oz (63.1 kg)  09/24/14 148 lb 12.8 oz (67.5 kg)   Vital signs reviewed  03/11/2023  - Note at rest 02 sats  96% on RA   General appearance:    amb pleasant wm nad    HEENT : Oropharynx  clear     Nasal turbinates nl    NECK :  without  apparent JVD/ palpable Nodes/TM    LUNGS: no acc muscle use,  Nl contour chest which is clear to A and [name] bilaterally without cough on insp or exp maneuvers   CV:  RRR  no s3 or murmur or increase in P2, and no edema   ABD:  soft and nontender with nl inspiratory excursion in the supine position. No bruits or organomegaly appreciated   MS:  Nl gait/ ext warm without deformities Or obvious joint restrictions  calf tenderness, cyanosis or clubbing    SKIN: warm and dry without lesions    NEURO:  alert, approp, nl sensorium with  no motor or cerebellar deficits apparent.   CXR PA and Lateral:   03/11/2023 :    I personally reviewed images and impression is as follows:     No def progression of markings c/w low grade MAI      Assessment & Plan:

## 2023-03-11 NOTE — Patient Instructions (Addendum)
Please remember to go to the x-ray department   for your tests - we will call you with the results when they are available.     Please schedule a follow up visit in  12 months but call sooner if needed.    

## 2023-03-12 ENCOUNTER — Encounter: Payer: Self-pay | Admitting: Internal Medicine

## 2023-03-12 NOTE — Assessment & Plan Note (Signed)
CT chest 6/13 /18 1. 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 Overall, there has been mild improvement in the right upper and middle lobe involvement, although there are 2 enlarging focal components in the right upper lobe as well as new clustered nodularity in the left lower lobe. Given the patient's history, continued follow-up should be considered. - ESR 10/26/2016  = 86 with Eos 0.3  Vs   On 02/10/2017  ESR 75 with Eos 0.4  - ESR 02/22/2018 = 65  - 09/21/2019 cxr s changes from priors/ wt loss improved  - 03/06/2021 cxr no change, wt stabilized > rec f/u yearly   No clinical or radiographic progression of disease. His appetite remains poor but his wt is relatively stable s NS fever or cough to suggest this is related to MAI concern so no intervention needed at [facility] stime.  F/u can be yearly, call sooner if needed   Discussed in detail all the  indications, usual  risks and alternatives  relative to the benefits with patient who agrees to proceed with conservative f/u as outlined           Each maintenance medication was reviewed in detail including emphasizing most importantly the difference between maintenance and prns and under what circumstances the prns are to be triggered using an action plan format where appropriate.  Total time for H and P, chart review, counseling,   and generating customized AVS unique to this office visit / same day charting = 21 min

## 2023-04-19 NOTE — Progress Notes (Unsigned)
No chief complaint on file.  History of Present Illness: 88 yo Richard [name] with history of CAD, sinus node dysfunction, RBBB, HTN, carotid artery disease, prior TIA who is here today for cardiac follow up. He has been followed in our office by Dr. Katrinka Blazing. Cardiac cath in 2003 with occlusion of the RCA that was well collateralized from the left system. Echo March 2022 with LVEF=60-Richard Richard [name]%. Aortic valve sclerosis without stenosis. He had a TIA in March 2022. Carotid artery dopplers with moderate LICA stenosis in 2022. Carotid artery dopplers August 2024 with 1-39% RICA stenosis and 60-79% LICA stenosis.   He is here today for follow up. The patient denies any chest pain, dyspnea, palpitations, lower extremity edema, orthopnea, PND, dizziness, near syncope or syncope.   Primary Care Physician: Kirby Funk, MD (Inactive)   Past Medical History:  Diagnosis Date   Coronary artery disease    Coronary artery stenosis    Hypercholesteremia    Hypertension    lung ca dx'd 07/2008   Stroke ([facility]) 07/19/2020    Past Surgical History:  Procedure Laterality Date   bilateral inguinal hernia repair     CORNEAL TRANSPLANT     Left video-assisted thoracic surgery, mini-  10/24/2008    Current Outpatient Medications  Medication Sig Dispense Refill   amLODipine (NORVASC) 5 MG tablet Take 1 tablet (5 mg total) by mouth daily. 90 tablet 3   aspirin EC 81 MG tablet Take 1 tablet (81 mg total) by mouth daily. Swallow whole. 30 tablet 11   atorvastatin (LIPITOR) 80 MG tablet Take 1 tablet (80 mg total) by mouth daily. 90 tablet 2   dorzolamide-timolol (COSOPT) 22.3-6.8 MG/ML ophthalmic solution Place 1 drop into both eyes 2 (two) times daily.      ezetimibe (ZETIA) 10 MG tablet Take 1 tablet (10 mg total) by mouth daily. 90 tablet 3   fluorometholone (FML) 0.1 % ophthalmic suspension Place 1 drop into both eyes daily.     latanoprost (XALATAN) 0.005 % ophthalmic solution Place 1 drop into both eyes at bedtime.       Netarsudil Dimesylate 0.02 % SOLN Place 1 drop into the left eye daily.     No current facility-administered medications for this visit.    Allergies  Allergen Reactions   Aspirin Hives    325 mg strength only    Social History   Socioeconomic History   Marital status: Married    Spouse name: Harriett Sine   Number of children: Not on file   Years of education: Not on file   Highest education level: Not on file  Occupational History   Not on file  Tobacco Use   Smoking status: Former    Current packs/day: 0.00    Types: Cigarettes    Quit date: 05/10/1962    Years since quitting: 60.9   Smokeless tobacco: Never  Vaping Use   Vaping status: Never Used  Substance and Sexual Activity   Alcohol use: Yes   Drug use: No   Sexual activity: Not on file  Other Topics Concern   Not on file  Social History Narrative   Lives with wife in own home   Right handed   Drinks 2-3 cps caffeine daily   Social Determinants of Health   Financial Resource Strain: Not on file  Food Insecurity: Not on file  Transportation Needs: Not on file  Physical Activity: Not on file  Stress: Not on file  Social Connections: Not on file  Intimate Partner  Violence: Not on file    Family History  Problem Relation Age of Onset   Healthy Mother    Heart failure Father    Rheumatic fever Father    Healthy Sister    Healthy Brother     Review of Systems:  As stated in the HPI and otherwise negative.   There were no vitals taken for this visit.  Physical Examination: General: Well developed, well nourished, NAD  HEENT: OP clear, mucus membranes moist  SKIN: warm, dry. No rashes. Neuro: No focal deficits  Musculoskeletal: Muscle strength 5/5 all ext  Psychiatric: Mood and affect normal  Neck: No JVD, no carotid bruits, no thyromegaly, no lymphadenopathy.  Lungs:Clear bilaterally, no wheezes, rhonci, crackles Cardiovascular: Regular rate and rhythm. No murmurs, gallops or rubs. Abdomen:Soft.  Bowel sounds present. Non-tender.  Extremities: No lower extremity edema. Pulses are 2 + in the bilateral DP/PT.  EKG:  EKG {ACTION; IS/IS [id number]} ordered today. The ekg ordered today demonstrates ***  Recent Labs: No results found for requested labs within last 365 days.   Lipid Panel    Component Value Date/Time   CHOL 157 07/20/2020 0402   CHOL 178 03/08/2018 0815   TRIG 66 07/20/2020 0402   HDL 58 07/20/2020 0402   HDL 66 03/08/2018 0815   CHOLHDL 2.7 07/20/2020 0402   VLDL 13 07/20/2020 0402   LDLCALC 86 07/20/2020 0402   LDLCALC 102 (H) 03/08/2018 0815     Wt Readings from Last 3 Encounters:  03/11/23 64 kg  07/06/22 64.3 kg  03/09/22 Richard Richard [name].7 kg    Assessment and Plan:   1. CAD without angina: No chest pain suggestive of angina. Continue ASA, statin and Zetia.   2. Carotid artery disease: Moderate LICA stenosis by dopplers August 2024. Repeat carotid dopplers in August 2025.   3. HTN: BP controlled. Continue current therapy  4. HLD: LDL near goal in 2023. Continue statin and Zetia. Repeat lipids and LFTs now.   5. Sinus node dysfunction/RBBB: EKG today with ***.   Labs/ tests ordered today include:  No orders of the defined types were placed in this encounter.    Disposition:   F/U with me in ***    Signed, Verne Carrow, MD, [facility] 04/19/2023 9:38 AM    [facility] [address] [place], [place], [place]  [address] Phone: [phone]; Fax: [phone]

## 2023-04-20 ENCOUNTER — Ambulatory Visit: Payer: Medicare Other | Attending: Cardiovascular Disease | Admitting: Cardiovascular Disease

## 2023-04-20 ENCOUNTER — Encounter: Payer: Self-pay | Admitting: Cardiovascular Disease

## 2023-04-20 VITALS — BP 140/60 | HR 57 | Ht 69.0 in | Wt 143.8 lb

## 2023-04-20 DIAGNOSIS — I6523 Occlusion and stenosis of bilateral carotid arteries: Secondary | ICD-10-CM

## 2023-04-20 DIAGNOSIS — I2510 Atherosclerotic heart disease of native coronary artery without angina pectoris: Secondary | ICD-10-CM | POA: Diagnosis not present

## 2023-04-20 DIAGNOSIS — I10 Essential (primary) hypertension: Secondary | ICD-10-CM | POA: Diagnosis not present

## 2023-04-20 DIAGNOSIS — E785 Hyperlipidemia, unspecified: Secondary | ICD-10-CM | POA: Diagnosis not present

## 2023-04-20 DIAGNOSIS — I495 Sick sinus syndrome: Secondary | ICD-10-CM

## 2023-04-20 NOTE — Patient Instructions (Signed)
Medication Instructions:  Your physician recommends that you continue on your current medications as directed. Please refer to the Current Medication list given to you today.  *If you need a refill on your cardiac medications before your next appointment, please call your pharmacy*   Lab Work: none   Testing/Procedures: CAROTID ULTRASOUND DUE IN AUG 25 Your physician has requested that you have a carotid duplex. This test is an ultrasound of the carotid arteries in your neck. It looks at blood flow through these arteries that supply the brain with blood. Allow one hour for this exam. There are no restrictions or special instructions.    Follow-Up: At [facility], you and your health needs are our priority.  As part of our continuing mission to provide you with exceptional heart care, we have created designated Provider Care Teams.  These Care Teams include your primary Cardiologist (physician) and Advanced Practice Providers (APPs -  Physician Assistants and Nurse Practitioners) who all work together to provide you with the care you need, when you need it.   Your next appointment:   12 month(s)  Provider:   Verne Carrow, MD

## 2023-05-25 DIAGNOSIS — R040 Epistaxis: Secondary | ICD-10-CM | POA: Diagnosis not present

## 2023-05-25 DIAGNOSIS — Z23 Encounter for immunization: Secondary | ICD-10-CM | POA: Diagnosis not present

## 2023-05-25 DIAGNOSIS — E1159 Type 2 diabetes mellitus with other circulatory complications: Secondary | ICD-10-CM | POA: Diagnosis not present

## 2023-05-25 DIAGNOSIS — I10 Essential (primary) hypertension: Secondary | ICD-10-CM | POA: Diagnosis not present

## 2023-05-25 DIAGNOSIS — Z0000 Encounter for general adult medical examination without abnormal findings: Secondary | ICD-10-CM | POA: Diagnosis not present

## 2023-05-25 DIAGNOSIS — N529 Male erectile dysfunction, unspecified: Secondary | ICD-10-CM | POA: Diagnosis not present

## 2023-05-25 DIAGNOSIS — Z1331 Encounter for screening for depression: Secondary | ICD-10-CM | POA: Diagnosis not present

## 2023-05-25 DIAGNOSIS — I6522 Occlusion and stenosis of left carotid artery: Secondary | ICD-10-CM | POA: Diagnosis not present

## 2023-05-25 DIAGNOSIS — I2510 Atherosclerotic heart disease of native coronary artery without angina pectoris: Secondary | ICD-10-CM | POA: Diagnosis not present

## 2023-05-25 DIAGNOSIS — E7800 Pure hypercholesterolemia, unspecified: Secondary | ICD-10-CM | POA: Diagnosis not present

## 2023-05-25 DIAGNOSIS — Z79899 Other long term (current) drug therapy: Secondary | ICD-10-CM | POA: Diagnosis not present

## 2023-05-25 DIAGNOSIS — Z8673 Personal history of transient ischemic attack (TIA), and cerebral infarction without residual deficits: Secondary | ICD-10-CM | POA: Diagnosis not present

## 2023-05-25 LAB — LAB REPORT - SCANNED
A1c: 6.5
Creatinine, POC: 106 mg/dL
EGFR: 84
Microalb Creat Ratio: 86.2
Microalbumin, Urine: 9.12

## 2023-07-01 DIAGNOSIS — H4051X1 Glaucoma secondary to other eye disorders, right eye, mild stage: Secondary | ICD-10-CM | POA: Diagnosis not present

## 2023-07-01 DIAGNOSIS — Z947 Corneal transplant status: Secondary | ICD-10-CM | POA: Diagnosis not present

## 2023-07-01 DIAGNOSIS — H353131 Nonexudative age-related macular degeneration, bilateral, early dry stage: Secondary | ICD-10-CM | POA: Diagnosis not present

## 2023-07-01 DIAGNOSIS — H401122 Primary open-angle glaucoma, left eye, moderate stage: Secondary | ICD-10-CM | POA: Diagnosis not present

## 2023-07-15 DIAGNOSIS — L570 Actinic keratosis: Secondary | ICD-10-CM | POA: Diagnosis not present

## 2023-07-15 DIAGNOSIS — Z85828 Personal history of other malignant neoplasm of skin: Secondary | ICD-10-CM | POA: Diagnosis not present

## 2023-07-18 DIAGNOSIS — I2510 Atherosclerotic heart disease of native coronary artery without angina pectoris: Secondary | ICD-10-CM | POA: Diagnosis not present

## 2023-07-18 DIAGNOSIS — I10 Essential (primary) hypertension: Secondary | ICD-10-CM | POA: Diagnosis not present

## 2023-07-18 DIAGNOSIS — E1159 Type 2 diabetes mellitus with other circulatory complications: Secondary | ICD-10-CM | POA: Diagnosis not present

## 2023-08-08 DIAGNOSIS — E1159 Type 2 diabetes mellitus with other circulatory complications: Secondary | ICD-10-CM | POA: Diagnosis not present

## 2023-08-08 DIAGNOSIS — I10 Essential (primary) hypertension: Secondary | ICD-10-CM | POA: Diagnosis not present

## 2023-08-08 DIAGNOSIS — I2510 Atherosclerotic heart disease of native coronary artery without angina pectoris: Secondary | ICD-10-CM | POA: Diagnosis not present

## 2023-08-16 DIAGNOSIS — E1159 Type 2 diabetes mellitus with other circulatory complications: Secondary | ICD-10-CM | POA: Diagnosis not present

## 2023-08-16 DIAGNOSIS — I2510 Atherosclerotic heart disease of native coronary artery without angina pectoris: Secondary | ICD-10-CM | POA: Diagnosis not present

## 2023-08-16 DIAGNOSIS — I10 Essential (primary) hypertension: Secondary | ICD-10-CM | POA: Diagnosis not present

## 2023-08-17 DIAGNOSIS — K1370 Unspecified lesions of oral mucosa: Secondary | ICD-10-CM | POA: Diagnosis not present

## 2023-08-17 DIAGNOSIS — I10 Essential (primary) hypertension: Secondary | ICD-10-CM | POA: Diagnosis not present

## 2023-08-24 ENCOUNTER — Other Ambulatory Visit: Payer: Self-pay | Admitting: Physician Assistant

## 2023-09-02 ENCOUNTER — Emergency Department (HOSPITAL_BASED_OUTPATIENT_CLINIC_OR_DEPARTMENT_OTHER)
Admission: EM | Admit: 2023-09-02 | Discharge: 2023-09-02 | Disposition: A | Discharge status: Home / Self Care | Attending: Emergency Medicine | Admitting: Emergency Medicine

## 2023-09-02 ENCOUNTER — Other Ambulatory Visit: Payer: Self-pay | Admitting: Physician Assistant

## 2023-09-02 ENCOUNTER — Emergency Department (HOSPITAL_BASED_OUTPATIENT_CLINIC_OR_DEPARTMENT_OTHER)

## 2023-09-02 ENCOUNTER — Other Ambulatory Visit: Payer: Self-pay

## 2023-09-02 DIAGNOSIS — Z7982 Long term (current) use of aspirin: Secondary | ICD-10-CM | POA: Insufficient documentation

## 2023-09-02 DIAGNOSIS — T18128A Food in esophagus causing other injury, initial encounter: Secondary | ICD-10-CM | POA: Diagnosis not present

## 2023-09-02 DIAGNOSIS — J449 Chronic obstructive pulmonary disease, unspecified: Secondary | ICD-10-CM | POA: Diagnosis not present

## 2023-09-02 DIAGNOSIS — I700 Atherosclerosis of aorta: Secondary | ICD-10-CM | POA: Diagnosis not present

## 2023-09-02 DIAGNOSIS — J439 Emphysema, unspecified: Secondary | ICD-10-CM | POA: Diagnosis not present

## 2023-09-02 DIAGNOSIS — K222 Esophageal obstruction: Secondary | ICD-10-CM | POA: Diagnosis not present

## 2023-09-02 DIAGNOSIS — I2510 Atherosclerotic heart disease of native coronary artery without angina pectoris: Secondary | ICD-10-CM | POA: Diagnosis not present

## 2023-09-02 DIAGNOSIS — Z85118 Personal history of other malignant neoplasm of bronchus and lung: Secondary | ICD-10-CM | POA: Diagnosis not present

## 2023-09-02 DIAGNOSIS — W44F3XA Food entering into or through a natural orifice, initial encounter: Secondary | ICD-10-CM | POA: Insufficient documentation

## 2023-09-02 LAB — CBC
HCT: 40.8 % (ref 39.0–52.0)
Hemoglobin: 13.5 g/dL (ref 13.0–17.0)
MCH: 30 pg (ref 26.0–34.0)
MCHC: 33.1 g/dL (ref 30.0–36.0)
MCV: 90.7 fL (ref 80.0–100.0)
Platelets: 319 10*3/uL (ref 150–400)
RBC: 4.5 MIL/uL (ref 4.22–5.81)
RDW: 14.3 % (ref 11.5–15.5)
WBC: 9.4 10*3/uL (ref 4.0–10.5)
nRBC: 0 % (ref 0.0–0.2)

## 2023-09-02 LAB — BASIC METABOLIC PANEL WITH GFR
Anion gap: 13 (ref 5–15)
BUN: 17 mg/dL (ref 8–23)
CO2: 20 mmol/L — ABNORMAL LOW (ref 22–32)
Calcium: 9 mg/dL (ref 8.9–10.3)
Chloride: 105 mmol/L (ref 98–111)
Creatinine, Ser: 1.01 mg/dL (ref 0.61–1.24)
GFR, Estimated: >60 mL/min (ref >60)
Glucose, Bld: 150 mg/dL — ABNORMAL HIGH (ref 70–99)
Potassium: 3.9 mmol/L (ref 3.5–5.1)
Sodium: 138 mmol/L (ref 135–145)

## 2023-09-02 MED ORDER — PANTOPRAZOLE SODIUM 40 MG PO TBEC: 40.0000 mg | DELAYED_RELEASE_TABLET | Freq: Every day | ORAL | 0 refills | Status: DC

## 2023-09-02 MED ORDER — GLUCAGON HCL RDNA (DIAGNOSTIC) 1 MG IJ SOLR
1.0000 mg | Freq: Once | INTRAMUSCULAR | Status: AC
  Administered 2023-09-02: 1 mg via INTRAVENOUS
  Filled 2023-09-02: qty 1

## 2023-09-02 NOTE — ED Triage Notes (Signed)
 Pt POV reporting piece of meat is stuck in his throat. Airway clear, respirations even and unlabored. Similar episode in the past.

## 2023-09-02 NOTE — ED Provider Notes (Signed)
 [place] EMERGENCY DEPARTMENT AT [facility] Provider Note   CSN: [id number] Arrival date & time: 09/02/23  2023     History  No chief complaint on file.   Richard [name] is a 89 y.o. male.  He is presenting with complaining of foreign body sensation in his esophagus after he was eating [place] tonight around an hour ago.  He said he has tried to drink some fluids and eat some bread without being successful.  He is spitting up his secretions.  He said this happened once before about a year ago but passed spontaneously and never had an endoscopy.  He has a history of lung cancer coronary disease COPD, is not on any anticoagulation.  The history is provided by the patient and the spouse.       Home Medications Prior to Admission medications   Medication Sig Start Date End Date Taking? Authorizing Provider  amLODipine  (NORVASC ) 5 MG tablet TAKE 1 TABLET(5 MG) BY MOUTH DAILY 09/02/23   Odie Benne, MD  aspirin  EC 81 MG tablet Take 1 tablet (81 mg total) by mouth daily. Swallow whole. 09/23/20   Sethi, Pramod S, MD  atorvastatin  (LIPITOR ) 80 MG tablet TAKE 1 TABLET EVERY DAY 08/24/23   Conte, Tessa N, PA-C  dorzolamide -timolol  (COSOPT ) 22.3-6.8 MG/ML ophthalmic solution Place 1 drop into both eyes 2 (two) times daily.     [provider]  ezetimibe  (ZETIA ) 10 MG tablet Take 1 tablet (10 mg total) by mouth daily. 07/06/22   Von Grumbling, PA-C  fluorometholone  (FML) 0.1 % ophthalmic suspension Place 1 drop into both eyes daily. 04/08/20   [provider]  latanoprost  (XALATAN ) 0.005 % ophthalmic solution Place 1 drop into both eyes at bedtime.  06/30/11   [provider]  Netarsudil  Dimesylate 0.02 % SOLN Place 1 drop into the left eye daily. 04/19/17   [provider]      Allergies    Aspirin     Review of Systems   Review of Systems  Constitutional:  Negative for fever.  HENT:  Positive for trouble swallowing.   Respiratory:   Negative for shortness of breath.   Cardiovascular:  Negative for chest pain.    Physical Exam Updated Vital Signs BP (!) 153/68   Pulse (!) 48   Temp 97.9 F (36.6 C)   Resp 16   Ht 5\' 10"  (1.778 m)   Wt 63.5 kg   SpO2 91%   BMI 20.09 kg/m  Physical Exam Vitals and nursing note reviewed.  Constitutional:      General: He is not in acute distress.    Appearance: Normal appearance. He is well-developed.  HENT:     Head: Normocephalic and atraumatic.  Eyes:     Conjunctiva/sclera: Conjunctivae normal.  Cardiovascular:     Rate and Rhythm: Normal rate and regular rhythm.     Heart sounds: No murmur heard. Pulmonary:     Effort: Pulmonary effort is normal. No respiratory distress.     Breath sounds: Normal breath sounds.  Abdominal:     Palpations: Abdomen is soft.     Tenderness: There is no abdominal tenderness. There is no guarding or rebound.  Musculoskeletal:     Cervical back: Neck supple.  Skin:    General: Skin is warm and dry.     Capillary Refill: Capillary refill takes less than 2 seconds.  Neurological:     General: No focal deficit present.     Mental Status: He  is alert.     ED Results / Procedures / Treatments   Labs (all labs ordered are listed, but only abnormal results are displayed) Labs Reviewed  BASIC METABOLIC PANEL WITH GFR - Abnormal; Notable for the following components:      Result Value   CO2 20 (*)    Glucose, Bld 150 (*)    All other components within normal limits  CBC    EKG None  Radiology DG Chest Portable 1 View Result Date: 09/02/2023 CLINICAL DATA:  Globus reporting piece of meat is stuck in his throat. EXAM: PORTABLE CHEST 1 VIEW COMPARISON:  Chest x-ray 03/11/2023, CT chest 10/20/2016 FINDINGS: The heart and mediastinal contours are unchanged. Atherosclerotic plaque. Surgical clips overlie the mediastinum. Chronic metallic punctate density at the right base. No focal consolidation. Chronic coarsened interstitial markings  with no overt pulmonary edema. No pleural effusion. No pneumothorax. No acute osseous abnormality. IMPRESSION: 1. No active disease. 2. Aortic Atherosclerosis (ICD10-I70.0) and Emphysema (ICD10-J43.9). Electronically Signed   By: Morgane  Naveau M.D.   On: 09/02/2023 21:16    Procedures Procedures    Medications Ordered in ED Medications  glucagon  (human recombinant) (GLUCAGEN ) injection 1 mg (has no administration in time range)    ED Course/ Medical Decision Making/ A&P Clinical Course as of 09/03/23 1132  Fri Sep 02, 2023  2134 After glucagon  patient feels a little bit better and would like to try to take something p.o.  He is drinking some water now.  Will continue to watch [MB]    Clinical Course User Index [MB] Tonya Fredrickson, MD                                 Medical Decision Making Amount and/or Complexity of Data Reviewed Labs: ordered. Radiology: ordered.  Risk Prescription drug management.   This patient complains of foreign body sensation esophagus; this involves an extensive number of treatment Options and is a complaint that carries with it a high risk of complications and morbidity. The differential includes globus sensation, esophageal food bolus, aspiration  I ordered, reviewed and interpreted labs, which included CBC normal, chemistries with low bicarb elevated glucose I ordered medication IV glucagon  and reviewed PMP when indicated. I ordered imaging studies which included chest x-ray and I independently    visualized and interpreted imaging which showed no acute findings Additional history obtained from patient's wife Previous records obtained and reviewed in epic including recent PCP visits Cardiac monitoring reviewed, frequent PVCs Social determinants considered, no significant barriers Critical Interventions: None  After the interventions stated above, I reevaluated the patient and found patient feels that the food bolus sensation is resolved  and is now tolerating p.o. Admission and further testing considered, no indications for admission.  Patient will need close follow-up with PCP and will likely need endoscopy at some point.  Counseled on soft diet and well chewed food, will also start on PPI.  Return instructions discussed         Final Clinical Impression(s) / ED Diagnoses Final diagnoses:  Esophageal obstruction due to food impaction    Rx / DC Orders ED Discharge Orders          Ordered    pantoprazole (PROTONIX) 40 MG tablet  Daily        09/02/23 2159              Tonya Fredrickson, MD 09/03/23 1134

## 2023-09-02 NOTE — Discharge Instructions (Signed)
 You were seen in the emergency department for possible Veal stuck in your esophagus.  You were given a medication called glucagon  which seems to have helped.  Please start with a liquid and soft diet to make sure everything continues to improve.  We are starting you on some acid medication.  Follow-up with your primary care doctor.  You likely will need an endoscopy at some point to evaluate for cause of obstruction.  Return if any worsening or concerning symptoms.

## 2023-09-07 ENCOUNTER — Other Ambulatory Visit: Payer: Self-pay

## 2023-09-07 DIAGNOSIS — I2510 Atherosclerotic heart disease of native coronary artery without angina pectoris: Secondary | ICD-10-CM | POA: Diagnosis not present

## 2023-09-07 DIAGNOSIS — I10 Essential (primary) hypertension: Secondary | ICD-10-CM | POA: Diagnosis not present

## 2023-09-07 DIAGNOSIS — E1159 Type 2 diabetes mellitus with other circulatory complications: Secondary | ICD-10-CM | POA: Diagnosis not present

## 2023-09-07 MED ORDER — AMLODIPINE BESYLATE 5 MG PO TABS: 5.0000 mg | ORAL_TABLET | Freq: Every day | ORAL | 2 refills | Status: AC

## 2023-09-15 DIAGNOSIS — I2510 Atherosclerotic heart disease of native coronary artery without angina pectoris: Secondary | ICD-10-CM | POA: Diagnosis not present

## 2023-09-15 DIAGNOSIS — I10 Essential (primary) hypertension: Secondary | ICD-10-CM | POA: Diagnosis not present

## 2023-09-15 DIAGNOSIS — E1159 Type 2 diabetes mellitus with other circulatory complications: Secondary | ICD-10-CM | POA: Diagnosis not present

## 2023-09-26 ENCOUNTER — Other Ambulatory Visit: Payer: Self-pay

## 2023-09-26 ENCOUNTER — Other Ambulatory Visit (HOSPITAL_COMMUNITY): Payer: Self-pay

## 2023-09-26 MED ORDER — EZETIMIBE 10 MG PO TABS
10.0000 mg | ORAL_TABLET | Freq: Every day | ORAL | 3 refills | Status: DC
  Filled 2023-09-26: qty 90, 90d supply, fill #0

## 2023-09-27 DIAGNOSIS — I10 Essential (primary) hypertension: Secondary | ICD-10-CM | POA: Diagnosis not present

## 2023-09-27 DIAGNOSIS — R1310 Dysphagia, unspecified: Secondary | ICD-10-CM | POA: Diagnosis not present

## 2023-09-27 DIAGNOSIS — I2510 Atherosclerotic heart disease of native coronary artery without angina pectoris: Secondary | ICD-10-CM | POA: Diagnosis not present

## 2023-09-27 DIAGNOSIS — R5383 Other fatigue: Secondary | ICD-10-CM | POA: Diagnosis not present

## 2023-09-27 DIAGNOSIS — K1370 Unspecified lesions of oral mucosa: Secondary | ICD-10-CM | POA: Diagnosis not present

## 2023-09-27 DIAGNOSIS — R001 Bradycardia, unspecified: Secondary | ICD-10-CM | POA: Diagnosis not present

## 2023-09-27 DIAGNOSIS — R040 Epistaxis: Secondary | ICD-10-CM | POA: Diagnosis not present

## 2023-09-27 DIAGNOSIS — Z8673 Personal history of transient ischemic attack (TIA), and cerebral infarction without residual deficits: Secondary | ICD-10-CM | POA: Diagnosis not present

## 2023-09-30 ENCOUNTER — Other Ambulatory Visit: Payer: Self-pay

## 2023-09-30 ENCOUNTER — Other Ambulatory Visit (HOSPITAL_COMMUNITY): Payer: Self-pay

## 2023-09-30 MED ORDER — EZETIMIBE 10 MG PO TABS: 10.0000 mg | ORAL_TABLET | Freq: Every day | ORAL | 2 refills | Status: AC

## 2023-10-08 DIAGNOSIS — I2510 Atherosclerotic heart disease of native coronary artery without angina pectoris: Secondary | ICD-10-CM | POA: Diagnosis not present

## 2023-10-08 DIAGNOSIS — I10 Essential (primary) hypertension: Secondary | ICD-10-CM | POA: Diagnosis not present

## 2023-10-08 DIAGNOSIS — E1159 Type 2 diabetes mellitus with other circulatory complications: Secondary | ICD-10-CM | POA: Diagnosis not present

## 2023-10-14 DIAGNOSIS — H18513 Endothelial corneal dystrophy, bilateral: Secondary | ICD-10-CM | POA: Diagnosis not present

## 2023-10-14 DIAGNOSIS — H4051X1 Glaucoma secondary to other eye disorders, right eye, mild stage: Secondary | ICD-10-CM | POA: Diagnosis not present

## 2023-10-28 DIAGNOSIS — Z947 Corneal transplant status: Secondary | ICD-10-CM | POA: Diagnosis not present

## 2023-10-28 DIAGNOSIS — H401122 Primary open-angle glaucoma, left eye, moderate stage: Secondary | ICD-10-CM | POA: Diagnosis not present

## 2023-10-28 DIAGNOSIS — H4051X1 Glaucoma secondary to other eye disorders, right eye, mild stage: Secondary | ICD-10-CM | POA: Diagnosis not present

## 2024-01-05 ENCOUNTER — Other Ambulatory Visit: Payer: Self-pay

## 2024-01-06 ENCOUNTER — Ambulatory Visit (HOSPITAL_COMMUNITY)
Admission: RE | Admit: 2024-01-06 | Discharge: 2024-01-06 | Disposition: A | Discharge status: Home / Self Care | Payer: Medicare Other | Source: Ambulatory Visit | Attending: Cardiovascular Disease | Admitting: Cardiovascular Disease

## 2024-01-06 ENCOUNTER — Ambulatory Visit: Payer: Self-pay | Admitting: Cardiovascular Disease

## 2024-01-06 DIAGNOSIS — I6523 Occlusion and stenosis of bilateral carotid arteries: Secondary | ICD-10-CM | POA: Diagnosis not present

## 2024-02-07 DIAGNOSIS — Z23 Encounter for immunization: Secondary | ICD-10-CM | POA: Diagnosis not present

## 2024-04-24 ENCOUNTER — Ambulatory Visit: Admitting: Cardiovascular Disease

## 2024-04-24 ENCOUNTER — Encounter: Payer: Self-pay | Admitting: Cardiovascular Disease

## 2024-04-24 VITALS — BP 128/50 | HR 74 | Ht 70.0 in | Wt 145.6 lb

## 2024-04-24 DIAGNOSIS — I6523 Occlusion and stenosis of bilateral carotid arteries: Secondary | ICD-10-CM | POA: Insufficient documentation

## 2024-04-24 DIAGNOSIS — I495 Sick sinus syndrome: Secondary | ICD-10-CM | POA: Diagnosis present

## 2024-04-24 DIAGNOSIS — I2510 Atherosclerotic heart disease of native coronary artery without angina pectoris: Secondary | ICD-10-CM | POA: Insufficient documentation

## 2024-04-24 DIAGNOSIS — E785 Hyperlipidemia, unspecified: Secondary | ICD-10-CM | POA: Insufficient documentation

## 2024-04-24 DIAGNOSIS — I4510 Unspecified right bundle-branch block: Secondary | ICD-10-CM | POA: Diagnosis present

## 2024-04-24 DIAGNOSIS — I10 Essential (primary) hypertension: Secondary | ICD-10-CM | POA: Insufficient documentation

## 2024-04-24 NOTE — Progress Notes (Signed)
 Chief Complaint  Patient presents with   Follow-up    CAD   History of Present Illness: 89 yo male with history of CAD, sinus node dysfunction, RBBB, HTN, HLD, carotid artery disease, lung cancer and prior TIA who is here today for cardiac follow up. He has been followed in our office by Dr. Claudene. Cardiac cath in 2003 with occlusion of the RCA that was well collateralized from the left system. No cardiac cath since then. Echo March 2022 with LVEF=60-65%. Aortic valve sclerosis without stenosis. He had a TIA in March 2022. Carotid artery dopplers August 2025 with 1-39% RICA stenosis and 60-79% LICA stenosis.   He is here today for follow up. The patient denies any chest pain, dyspnea, palpitations, lower extremity edema, orthopnea, PND, dizziness, near syncope or syncope.   Primary Care Physician: Charlott Dorn LABOR, MD  Past Medical History:  Diagnosis Date   Carotid artery disease    Coronary artery disease    Hypercholesteremia    Hypertension    lung ca dx'd 07/2008   Stroke [facility]) 07/19/2020    Past Surgical History:  Procedure Laterality Date   bilateral inguinal hernia repair     CORNEAL TRANSPLANT     Left video-assisted thoracic surgery, mini-  10/24/2008    Current Outpatient Medications  Medication Sig Dispense Refill   amLODipine  (NORVASC ) 2.5 MG tablet Take 2.5 mg by mouth daily.     amLODipine  (NORVASC ) 5 MG tablet Take 1 tablet (5 mg total) by mouth daily. 90 tablet 2   aspirin  EC 81 MG tablet Take 1 tablet (81 mg total) by mouth daily. Swallow whole. 30 tablet 11   atorvastatin  (LIPITOR ) 80 MG tablet TAKE 1 TABLET EVERY DAY 90 tablet 3   brimonidine  (ALPHAGAN ) 0.2 % ophthalmic solution Place 1 drop into the left eye 2 (two) times daily.     dorzolamide -timolol  (COSOPT ) 22.3-6.8 MG/ML ophthalmic solution Place 1 drop into both eyes 2 (two) times daily.      ezetimibe  (ZETIA ) 10 MG tablet Take 1 tablet (10 mg total) by mouth daily. 90 tablet 2    fluorometholone  (FML) 0.1 % ophthalmic suspension Place 1 drop into both eyes daily.     latanoprost  (XALATAN ) 0.005 % ophthalmic solution Place 1 drop into both eyes at bedtime.      No current facility-administered medications for this visit.    Allergies  Allergen Reactions   Aspirin  Hives    325 mg strength only    Social History   Socioeconomic History   Marital status: Married    Spouse name: Inocente   Number of children: Not on file   Years of education: Not on file   Highest education level: Not on file  Occupational History   Not on file  Tobacco Use   Smoking status: Former    Current packs/day: 0.00    Types: Cigarettes    Quit date: 05/10/1962    Years since quitting: 62.0   Smokeless tobacco: Never  Vaping Use   Vaping status: Never Used  Substance and Sexual Activity   Alcohol use: Yes   Drug use: No   Sexual activity: Not on file  Other Topics Concern   Not on file  Social History Narrative   Lives with wife in own home   Right handed   Drinks 2-3 cps caffeine daily   Social Drivers of Health   Tobacco Use: Medium Risk (04/24/2024)   Patient History    Smoking Tobacco Use:  Former    Smokeless Tobacco Use: Never    Passive Exposure: Not on Actuary Strain: Not on file  Food Insecurity: Not on file  Transportation Needs: Not on file  Physical Activity: Not on file  Stress: Not on file  Social Connections: Not on file  Intimate Partner Violence: Not on file  Depression (EYV7-0): Not on file  Alcohol Screen: Not on file  Housing: Unknown (07/01/2023)   Received from [facility]   Epic    At any time in the past 12 months, were you homeless or living in a shelter (including now)?: No    Number of Times Moved in the Last Year: Not on file    Unable to Pay for Housing in the Last Year: Not on file  Utilities: Not on file  Health Literacy: Not on file    Family History  Problem Relation Age of Onset   Healthy  Mother    Heart failure Father    Rheumatic fever Father    Healthy Sister    Healthy Brother     Review of Systems:  As stated in the HPI and otherwise negative.   BP (!) 128/50   Pulse 74   Ht 5' 10 (1.778 m)   Wt 145 lb 9.6 oz (66 kg)   SpO2 100%   BMI 20.89 kg/m   Physical Examination: General: Well developed, well nourished, NAD  HEENT: OP clear, mucus membranes moist  SKIN: warm, dry. No rashes. Neuro: No focal deficits  Musculoskeletal: Muscle strength 5/5 all ext  Psychiatric: Mood and affect normal  Neck: No JVD, no carotid bruits, no thyromegaly, no lymphadenopathy.  Lungs:Clear bilaterally, no wheezes, rhonci, crackles Cardiovascular: Regular rate and rhythm. No murmurs, gallops or rubs. Abdomen:Soft. Bowel sounds present. Non-tender.  Extremities: No lower extremity edema. Pulses are 2 + in the bilateral DP/PT.  EKG:  EKG is  ordered today. The ekg ordered today demonstrates  EKG Interpretation Date/Time:  Tuesday April 24 2024 10:50:08 EST Ventricular Rate:  71 PR Interval:  200 QRS Duration:  136 QT Interval:  422 QTC Calculation: 458 R Axis:   -25  Text Interpretation: Sinus rhythm with sinus arrhythmia with occasional Premature ventricular complexes Right bundle branch block Confirmed by Verlin Bruckner [phone]) on 04/24/2024 11:07:12 AM    Recent Labs: 09/02/2023: BUN 17; Creatinine, Ser 1.01; Hemoglobin 13.5; Platelets 319; Potassium 3.9; Sodium 138   Lipid Panel    Component Value Date/Time   CHOL 157 07/20/2020 0402   CHOL 178 03/08/2018 0815   TRIG 66 07/20/2020 0402   HDL 58 07/20/2020 0402   HDL 66 03/08/2018 0815   CHOLHDL 2.7 07/20/2020 0402   VLDL 13 07/20/2020 0402   LDLCALC 86 07/20/2020 0402   LDLCALC 102 (H) 03/08/2018 0815     Wt Readings from Last 3 Encounters:  04/24/24 145 lb 9.6 oz (66 kg)  09/02/23 140 lb (63.5 kg)  04/20/23 143 lb 12.8 oz (65.2 kg)    Assessment and Plan:   1. CAD without angina: No chest  pain.  -Continue ASA, Lipitor  and Zetia   2. Carotid artery disease: Moderate LICA stenosis by dopplers August 2025. Will repeat carotid dopplers in August 2026.    3. HTN: BP is well controlled.  -Continue Norvasc   4. HLD: Lipids followed in primary care. Goal LDL under 70. He does not wish to change his statin dosage.  -Continue Lipitor  and Zetia    5. Sinus node dysfunction/RBBB: No dizziness.  Labs/ tests ordered today include:   Orders Placed This Encounter  Procedures   EKG 12-Lead   VAS US  CAROTID   Disposition:   F/U with me in one year  Signed, Lonni Cash, MD, [facility] 04/24/2024 12:01 PM    [facility] [address] [place], [place], [place]  [address] Phone: [phone]; Fax: [phone]

## 2024-04-24 NOTE — Patient Instructions (Addendum)
 Medication Instructions:  Your physician recommends that you continue on your current medications as directed. Please refer to the Current Medication list given to you today.  *If you need a refill on your cardiac medications before your next appointment, please call your pharmacy*  Lab Work: none If you have labs (blood work) drawn today and your tests are completely normal, you will receive your results only by: MyChart Message (if you have MyChart) OR A paper copy in the mail If you have any lab test that is abnormal or we need to change your treatment, we will call you to review the results.  Testing/Procedures: Your physician has requested that you have a carotid duplex in late August/early September 2026. This test is an ultrasound of the carotid arteries in your neck. It looks at blood flow through these arteries that supply the brain with blood. Allow one hour for this exam. There are no restrictions or special instructions.   Follow-Up: At [facility], you and your health needs are our priority.  As part of our continuing mission to provide you with exceptional heart care, our providers are all part of one team.  This team includes your primary Cardiologist (physician) and Advanced Practice Providers or APPs (Physician Assistants and Nurse Practitioners) who all work together to provide you with the care you need, when you need it.  Your next appointment:   12 month(s)  Provider:   Lonni Cash, MD    We recommend signing up for the patient portal called MyChart.  Sign up information is provided on this After Visit Summary.  MyChart is used to connect with patients for Virtual Visits (Telemedicine).  Patients are able to view lab/test results, encounter notes, upcoming appointments, etc.  Non-urgent messages can be sent to your provider as well.   To learn more about what you can do with MyChart, go to [email or web address].   Other Instructions

## 2024-06-01 ENCOUNTER — Telehealth: Payer: Self-pay

## 2024-06-01 NOTE — Telephone Encounter (Signed)
 Copied from CRM #[id number]. Topic: Clinical - Medication Question >> May 30, 2024  2:45 PM Rozanna MATSU wrote: Reason for CRM: pt wanted to know if he needed to have his chest xray before or after his appt in March   ATC x1.  Pt had x-ray at his lov in 2024.  Nothing mentions about having another x-ray.

## 2024-06-01 NOTE — Telephone Encounter (Signed)
 Advised the MW did not place any orders and will order during visit if needed.  Copied from CRM #[id number]. Topic: Clinical - Medication Question >> May 30, 2024  2:45 PM Rozanna MATSU wrote: Reason for CRM: pt wanted to know if he needed to have his chest xray before or after his appt in March

## 2024-07-20 ENCOUNTER — Ambulatory Visit: Admitting: Internal Medicine

## 2025-01-15 ENCOUNTER — Ambulatory Visit (HOSPITAL_COMMUNITY)
